# Patient Record
Sex: Male | Born: 1945 | Race: White | Hispanic: No | Marital: Married | Smoking: Former smoker
Health system: Southern US, Community
[De-identification: ages and names within clinical notes are randomized; demographics above are authoritative.]

## PROBLEM LIST (undated history)

## (undated) DIAGNOSIS — Z87442 Personal history of urinary calculi: Secondary | ICD-10-CM

## (undated) DIAGNOSIS — E119 Type 2 diabetes mellitus without complications: Secondary | ICD-10-CM

## (undated) DIAGNOSIS — F32A Depression, unspecified: Secondary | ICD-10-CM

## (undated) DIAGNOSIS — I10 Essential (primary) hypertension: Secondary | ICD-10-CM

## (undated) DIAGNOSIS — E669 Obesity, unspecified: Secondary | ICD-10-CM

## (undated) DIAGNOSIS — G4733 Obstructive sleep apnea (adult) (pediatric): Secondary | ICD-10-CM

## (undated) DIAGNOSIS — G4700 Insomnia, unspecified: Secondary | ICD-10-CM

## (undated) DIAGNOSIS — E785 Hyperlipidemia, unspecified: Secondary | ICD-10-CM

## (undated) DIAGNOSIS — E291 Testicular hypofunction: Secondary | ICD-10-CM

## (undated) DIAGNOSIS — F329 Major depressive disorder, single episode, unspecified: Secondary | ICD-10-CM

## (undated) DIAGNOSIS — M1990 Unspecified osteoarthritis, unspecified site: Secondary | ICD-10-CM

## (undated) DIAGNOSIS — G709 Myoneural disorder, unspecified: Secondary | ICD-10-CM

## (undated) HISTORY — DX: Type 2 diabetes mellitus without complications: E11.9

## (undated) HISTORY — DX: Hyperlipidemia, unspecified: E78.5

## (undated) HISTORY — DX: Obesity, unspecified: E66.9

## (undated) HISTORY — PX: HERNIA REPAIR: SHX51

## (undated) HISTORY — DX: Essential (primary) hypertension: I10

## (undated) HISTORY — DX: Depression, unspecified: F32.A

## (undated) HISTORY — DX: Obstructive sleep apnea (adult) (pediatric): G47.33

## (undated) HISTORY — DX: Insomnia, unspecified: G47.00

## (undated) HISTORY — PX: TONSILLECTOMY: SUR1361

## (undated) HISTORY — DX: Testicular hypofunction: E29.1

## (undated) HISTORY — DX: Major depressive disorder, single episode, unspecified: F32.9

---

## 2004-08-25 ENCOUNTER — Ambulatory Visit: Payer: Self-pay | Admitting: Internal Medicine

## 2004-08-27 ENCOUNTER — Ambulatory Visit: Payer: Self-pay | Admitting: Internal Medicine

## 2004-09-08 ENCOUNTER — Ambulatory Visit: Payer: Self-pay

## 2005-12-03 ENCOUNTER — Emergency Department: Payer: Self-pay | Admitting: Emergency Medicine

## 2005-12-13 ENCOUNTER — Emergency Department: Payer: Self-pay | Admitting: Emergency Medicine

## 2006-03-20 ENCOUNTER — Ambulatory Visit: Payer: Self-pay | Admitting: Family Medicine

## 2006-06-15 ENCOUNTER — Ambulatory Visit: Payer: Self-pay | Admitting: Family Medicine

## 2006-08-30 ENCOUNTER — Emergency Department: Payer: Self-pay | Admitting: Emergency Medicine

## 2006-09-06 ENCOUNTER — Emergency Department: Payer: Self-pay | Admitting: Emergency Medicine

## 2008-01-24 ENCOUNTER — Emergency Department: Payer: Self-pay | Admitting: Unknown Physician Specialty

## 2008-08-04 DIAGNOSIS — E782 Mixed hyperlipidemia: Secondary | ICD-10-CM | POA: Insufficient documentation

## 2008-09-24 ENCOUNTER — Ambulatory Visit: Payer: Self-pay | Admitting: Gastroenterology

## 2008-12-15 ENCOUNTER — Inpatient Hospital Stay: Payer: Self-pay | Admitting: Specialist

## 2009-03-16 DIAGNOSIS — I10 Essential (primary) hypertension: Secondary | ICD-10-CM | POA: Insufficient documentation

## 2012-02-15 ENCOUNTER — Ambulatory Visit: Payer: Self-pay | Admitting: Gastroenterology

## 2012-02-16 LAB — PATHOLOGY REPORT

## 2012-02-28 ENCOUNTER — Ambulatory Visit: Payer: Self-pay | Admitting: Surgery

## 2012-02-28 DIAGNOSIS — I10 Essential (primary) hypertension: Secondary | ICD-10-CM

## 2012-03-05 ENCOUNTER — Ambulatory Visit: Payer: Self-pay | Admitting: Surgery

## 2012-03-06 LAB — PATHOLOGY REPORT

## 2012-12-27 ENCOUNTER — Ambulatory Visit: Payer: Self-pay | Admitting: Otolaryngology

## 2013-10-14 ENCOUNTER — Ambulatory Visit: Payer: Self-pay | Admitting: Nephrology

## 2014-04-22 ENCOUNTER — Ambulatory Visit: Payer: Self-pay | Admitting: Family Medicine

## 2014-05-20 ENCOUNTER — Ambulatory Visit
Admit: 2014-05-20 | Disposition: A | Discharge status: Home / Self Care | Payer: Self-pay | Attending: Family Medicine | Admitting: Family Medicine

## 2014-05-30 ENCOUNTER — Ambulatory Visit
Admit: 2014-05-30 | Disposition: A | Discharge status: Home / Self Care | Payer: Self-pay | Attending: Family Medicine | Admitting: Family Medicine

## 2014-06-20 NOTE — Op Note (Signed)
PATIENT NAME:  Brent Carroll, Brent Carroll DATE OF BIRTH:  [birth year]  DATE OF PROCEDURE:  03/05/2012  PREOPERATIVE DIAGNOSES: Chronic cholecystitis, cholelithiasis.   POSTOPERATIVE DIAGNOSES: Chronic cholecystitis, cholelithiasis.   PROCEDURE: Laparoscopic cholecystectomy, cholangiogram.   SURGEON: Renda RollsWilton Smith, M.D.   ANESTHESIA: General.   INDICATIONS: This 69 year old male has a history of pressure symptoms in the epigastric area. Also had ultrasound findings of multiple gallstones and surgery was recommended for definitive treatment.   DESCRIPTION OF PROCEDURE: The patient was placed on the operating room table in the supine position under general endotracheal anesthesia. The abdomen was clipped and prepared with ChloraPrep, draped in a sterile manner.   A short incision was made in the inferior aspect of the umbilicus and carried down to the deep fascia which was grasped with a laryngeal hook and elevated. A Veress needle was inserted, aspirated, and irrigated with a saline solution. Next, the peritoneal cavity was inflated with carbon dioxide. The Veress needle was removed. The 10 mm cannula was inserted. The 10 mm, 0-degree laparoscope was inserted to view the peritoneal cavity. The liver appeared to have some fatty infiltration. The stomach appeared normal. Another incision was made in the epigastrium slightly to the right of the midline to introduce an 11 mm cannula. Two incisions were made in the lateral aspect of the right upper quadrant to introduce two 5 mm cannulas. The gallbladder was identified and appeared typical. There was a large amount of fatty tissue and a retractor was needed for exposure. Therefore, another incision was made in the mid-epigastrium just slightly to the left of the midline to introduce another 11 mm cannula. The laparoscope was brought to this port. Next, a five finger fan retractor was introduced through the infraumbilical port and was used to retract  the omentum and transverse colon which allowed better exposure. Next, as the gallbladder was retracted towards the right shoulder and the pouch of Jon BillingsMorrison was retracted inferiorly and laterally, the porta hepatis was demonstrated. There was much fatty tissue in the operative area. Next, the cystic duct was dissected free from surrounding structures. Also, the cystic artery was dissected free from surrounding structures. The neck of the gallbladder was mobilized with incision of the visceral peritoneum. A critical view of safety was demonstrated. An Endo Clip was placed across the cystic duct adjacent to the neck of the gallbladder. An incision was made in the cystic duct to introduce a Reddick catheter. Half-strength Conray-60 dye was injected as the cholangiogram was done with fluoroscopy viewing the biliary tree and prompt flow of dye into the duodenum. No retained stones were seen. The Reddick catheter was removed. The cystic duct was doubly ligated with Endoclips and divided. One other small vessel was controlled with Endo Clip and divided. Next, the cystic artery was controlled with double Endoclips and divided. The gallbladder was dissected free from the liver with hook and cautery. Bleeding was very minimal. Hemostasis was subsequently intact. The gallbladder was delivered up through the infraumbilical incision, opened, and suctioned. Next, stone forceps were used removing one stone and then the stones were removed with a stone scoop.  It was noted that with the use of the stone scoop, there were a number of stones which fell out of the gallbladder onto the omentum. The gallbladder was removed. The 10 mm port was reinserted. The Endo Catch bag was used to scoop up these stones. The site was suctioned and appeared clean, and the stones were removed with the bag. All  the stones and gallbladder were submitted in formalin for routine pathology. The right upper quadrant was further inspected. The cannulas were  removed. Carbon dioxide was allowed to escape from the peritoneal cavity. There was some bleeding from the upper most 5 mm port site which was cauterized and the other wounds were inspected. Several small bleeding points were cauterized. All 5 wounds were infiltrated with 0.5% Sensorcaine with epinephrine. All 5 wounds were closed with interrupted 5-0 chromic subcuticular sutures, benzoin, and Steri-Strips. Dressings were applied with paper tape. The patient tolerated surgery satisfactorily and was then prepared for transfer to the recovery room.     ____________________________ Shela Commons. Renda Rolls, MD jws:es D: 03/05/2012 10:41:50 ET T: 03/05/2012 11:10:42 ET JOB#: [id number]  cc: Adella Hare, MD, <Dictator> Adella Hare MD ELECTRONICALLY SIGNED 03/05/2012 19:37

## 2014-08-21 ENCOUNTER — Other Ambulatory Visit: Payer: Self-pay | Admitting: Family Medicine

## 2014-08-22 ENCOUNTER — Encounter: Payer: Self-pay | Admitting: Emergency Medicine

## 2014-08-25 ENCOUNTER — Ambulatory Visit (INDEPENDENT_AMBULATORY_CARE_PROVIDER_SITE_OTHER): Payer: 59 | Admitting: Family Medicine

## 2014-08-25 ENCOUNTER — Encounter: Payer: Self-pay | Admitting: Family Medicine

## 2014-08-25 VITALS — BP 122/76 | HR 91 | Temp 97.8°F | Resp 18 | Ht 71.0 in | Wt 262.0 lb

## 2014-08-25 DIAGNOSIS — K2100 Gastro-esophageal reflux disease with esophagitis, without bleeding: Secondary | ICD-10-CM

## 2014-08-25 DIAGNOSIS — R609 Edema, unspecified: Secondary | ICD-10-CM | POA: Insufficient documentation

## 2014-08-25 DIAGNOSIS — I10 Essential (primary) hypertension: Secondary | ICD-10-CM

## 2014-08-25 DIAGNOSIS — G4733 Obstructive sleep apnea (adult) (pediatric): Secondary | ICD-10-CM | POA: Insufficient documentation

## 2014-08-25 DIAGNOSIS — N182 Chronic kidney disease, stage 2 (mild): Secondary | ICD-10-CM | POA: Insufficient documentation

## 2014-08-25 DIAGNOSIS — E669 Obesity, unspecified: Secondary | ICD-10-CM | POA: Diagnosis not present

## 2014-08-25 DIAGNOSIS — N529 Male erectile dysfunction, unspecified: Secondary | ICD-10-CM | POA: Insufficient documentation

## 2014-08-25 DIAGNOSIS — E0822 Diabetes mellitus due to underlying condition with diabetic chronic kidney disease: Secondary | ICD-10-CM

## 2014-08-25 DIAGNOSIS — IMO0001 Reserved for inherently not codable concepts without codable children: Secondary | ICD-10-CM | POA: Insufficient documentation

## 2014-08-25 DIAGNOSIS — E1129 Type 2 diabetes mellitus with other diabetic kidney complication: Secondary | ICD-10-CM | POA: Insufficient documentation

## 2014-08-25 DIAGNOSIS — E119 Type 2 diabetes mellitus without complications: Secondary | ICD-10-CM | POA: Insufficient documentation

## 2014-08-25 DIAGNOSIS — K219 Gastro-esophageal reflux disease without esophagitis: Secondary | ICD-10-CM | POA: Insufficient documentation

## 2014-08-25 DIAGNOSIS — E291 Testicular hypofunction: Secondary | ICD-10-CM | POA: Insufficient documentation

## 2014-08-25 DIAGNOSIS — M9979 Connective tissue and disc stenosis of intervertebral foramina of abdomen and other regions: Secondary | ICD-10-CM | POA: Insufficient documentation

## 2014-08-25 LAB — POCT UA - MICROALBUMIN: Microalbumin Ur, POC: 20 mg/L

## 2014-08-25 LAB — POCT GLYCOSYLATED HEMOGLOBIN (HGB A1C): Hemoglobin A1C: 8.6

## 2014-08-25 LAB — GLUCOSE, POCT (MANUAL RESULT ENTRY): POC Glucose: 202 mg/dl — AB (ref 70–99)

## 2014-08-25 MED ORDER — GLIMEPIRIDE 2 MG PO TABS: 2.0000 mg | ORAL_TABLET | Freq: Every day | ORAL | Status: DC

## 2014-08-25 NOTE — Patient Instructions (Signed)
F/U 4 MOObesity Obesity is defined as having too much total body fat and a body mass index (BMI) of 30 or more. BMI is an estimate of body fat and is calculated from your height and weight. Obesity happens when you consume more calories than you can burn by exercising or performing daily physical tasks. Prolonged obesity can cause major illnesses or emergencies, such as:   Stroke.  Heart disease.  Diabetes.  Cancer.  Arthritis.  High blood pressure (hypertension).  High cholesterol.  Sleep apnea.  Erectile dysfunction.  Infertility problems. CAUSES   Regularly eating unhealthy foods.  Physical inactivity.  Certain disorders, such as an underactive thyroid (hypothyroidism), Cushing's syndrome, and polycystic ovarian syndrome.  Certain medicines, such as steroids, some depression medicines, and antipsychotics.  Genetics.  Lack of sleep. DIAGNOSIS  A health care provider can diagnose obesity after calculating your BMI. Obesity will be diagnosed if your BMI is 30 or higher.  There are other methods of measuring obesity levels. Some other methods include measuring your skinfold thickness, your waist circumference, and comparing your hip circumference to your waist circumference. TREATMENT  A healthy treatment program includes some or all of the following:  Long-term dietary changes.  Exercise and physical activity.  Behavioral and lifestyle changes.  Medicine only under the supervision of your health care provider. Medicines may help, but only if they are used with diet and exercise programs. An unhealthy treatment program includes:  Fasting.  Fad diets.  Supplements and drugs. These choices do not succeed in long-term weight control.  HOME CARE INSTRUCTIONS   Exercise and perform physical activity as directed by your health care provider. To increase physical activity, try the following:  Use stairs instead of elevators.  Park farther away from store  entrances.  Garden, bike, or walk instead of watching television or using the computer.  Eat healthy, low-calorie foods and drinks on a regular basis. Eat more fruits and vegetables. Use low-calorie cookbooks or take healthy cooking classes.  Limit fast food, sweets, and processed snack foods.  Eat smaller portions.  Keep a daily journal of everything you eat. There are many free websites to help you with this. It may be helpful to measure your foods so you can determine if you are eating the correct portion sizes.  Avoid drinking alcohol. Drink more water and drinks without calories.  Take vitamins and supplements only as recommended by your health care provider.  Weight-loss support groups, Government social research officer, counselors, and stress reduction education can also be very helpful. SEEK IMMEDIATE MEDICAL CARE IF:  You have chest pain or tightness.  You have trouble breathing or feel short of breath.  You have weakness or leg numbness.  You feel confused or have trouble talking.  You have sudden changes in your vision. MAKE SURE YOU:  Understand these instructions.  Will watch your condition.  Will get help right away if you are not doing well or get worse. Document Released: 03/24/2004 Document Revised: 07/01/2013 Document Reviewed: 03/23/2011 [facility] Patient Information 2015 [place], [place]. This information is not intended to replace advice given to you by your health care provider. Make sure you discuss any questions you have with your health care provider.

## 2014-08-25 NOTE — Progress Notes (Signed)
Name: Brent Carroll   MRN: [id number]    DOB: 07/12/1945   Date:08/25/2014       Progress Note  Subjective  Chief Complaint  Chief Complaint  Patient presents with  . Hypertension  . Diabetes  . Hyperlipidemia  . Gastrophageal Reflux    Hypertension This is a chronic problem. The current episode started more than 1 year ago. The problem is unchanged. The problem is controlled. Associated symptoms include shortness of breath. Pertinent negatives include no blurred vision, chest pain, headaches, neck pain, orthopnea or palpitations. There are no associated agents to hypertension. Risk factors for coronary artery disease include diabetes mellitus, dyslipidemia, male gender, obesity, sedentary lifestyle and stress. Past treatments include angiotensin blockers and diuretics. The current treatment provides moderate improvement.  Diabetes He presents for his follow-up diabetic visit. He has type 2 diabetes mellitus. His disease course has been worsening. There are no hypoglycemic associated symptoms. Pertinent negatives for hypoglycemia include no dizziness, headaches, nervousness/anxiousness, seizures or tremors. Associated symptoms include fatigue, polydipsia and weight loss. Pertinent negatives for diabetes include no blurred vision, no chest pain and no weakness. Symptoms are worsening. Diabetic complications include autonomic neuropathy, impotence, nephropathy and peripheral neuropathy. Risk factors for coronary artery disease include diabetes mellitus, dyslipidemia, hypertension, male sex, obesity, sedentary lifestyle and stress. Current diabetic treatment includes oral agent (monotherapy). He is compliant with treatment all of the time. His weight is increasing steadily. He is following a diabetic diet. He rarely participates in exercise. His home blood glucose trend is increasing steadily. His highest blood glucose is >200 mg/dl. An ACE inhibitor/angiotensin II receptor blocker is being taken.   Hyperlipidemia This is a chronic problem. The current episode started more than 1 year ago. The problem is controlled. Recent lipid tests were reviewed and are normal. Exacerbating diseases include diabetes and obesity. Factors aggravating his hyperlipidemia include fatty foods. Associated symptoms include shortness of breath. Pertinent negatives include no chest pain, focal weakness or myalgias. Current antihyperlipidemic treatment includes statins and fibric acid derivatives. The current treatment provides moderate improvement of lipids. There are no compliance problems.   Gastrophageal Reflux He complains of heartburn. He reports no chest pain, no coughing, no nausea or no sore throat. This is a chronic problem. The problem occurs frequently. The problem has been unchanged. The heartburn does not wake him from sleep. The heartburn does not limit his activity. The symptoms are aggravated by bending, caffeine and certain foods. Associated symptoms include fatigue and weight loss. He has tried a PPI for the symptoms. The treatment provided moderate relief.      Past Medical History  Diagnosis Date  . Hypogonadism in male   . Obesity   . Diabetes mellitus without complication   . Hyperlipidemia   . Insomnia   . Hypertension     History  Substance Use Topics  . Smoking status: Former Games developer  . Smokeless tobacco: Not on file  . Alcohol Use: No     Current outpatient prescriptions:  .  aspirin 81 MG tablet, Take 81 mg by mouth daily., Disp: , Rfl:  .  atorvastatin (LIPITOR) 40 MG tablet, Take 40 mg by mouth daily., Disp: , Rfl:  .  fenofibrate micronized (LOFIBRA) 134 MG capsule, Take 1 capsule by mouth  daily, Disp: 90 capsule, Rfl: 3 .  furosemide (LASIX) 20 MG tablet, Take 20 mg by mouth., Disp: , Rfl:  .  JANUVIA 100 MG tablet, Take 1 tablet by mouth  daily, Disp: 90 tablet, Rfl:  3 .  losartan (COZAAR) 100 MG tablet, Take 100 mg by mouth daily., Disp: , Rfl:  .  omeprazole  (PRILOSEC) 20 MG capsule, Take 20 mg by mouth daily., Disp: , Rfl:  .  zolpidem (AMBIEN CR) 12.5 MG CR tablet, Take 12.5 mg by mouth at bedtime as needed for sleep., Disp: , Rfl:  .  gabapentin (NEURONTIN) 300 MG capsule, Take 300 mg by mouth 3 (three) times daily., Disp: , Rfl:   Allergies  Allergen Reactions  . Trazodone And Nefazodone     Review of Systems  Constitutional: Positive for weight loss and fatigue. Negative for fever and chills.  HENT: Negative for congestion, hearing loss, sore throat and tinnitus.   Eyes: Negative for blurred vision, double vision and redness.  Respiratory: Positive for shortness of breath. Negative for cough and hemoptysis.   Cardiovascular: Positive for leg swelling. Negative for chest pain, palpitations, orthopnea and claudication.  Gastrointestinal: Positive for heartburn. Negative for nausea, vomiting, diarrhea, constipation and blood in stool.  Genitourinary: Positive for impotence. Negative for dysuria, urgency, frequency and hematuria.  Musculoskeletal: Positive for joint pain. Negative for myalgias, back pain, falls and neck pain.  Skin: Negative for itching.  Neurological: Positive for sensory change. Negative for dizziness, tingling, tremors, focal weakness, seizures, loss of consciousness, weakness and headaches.  Endo/Heme/Allergies: Positive for polydipsia. Does not bruise/bleed easily.  Psychiatric/Behavioral: Negative for depression and substance abuse. The patient has insomnia. The patient is not nervous/anxious.      Objective  Filed Vitals:   08/25/14 1031  BP: 122/76  Pulse: 91  Temp: 97.8 F (36.6 C)  TempSrc: Oral  Resp: 18  Height: 5\' 11"  (1.803 m)  Weight: 262 lb (118.842 kg)  SpO2: 94%     Physical Exam  Constitutional: He is oriented to person, place, and time and well-developed, well-nourished, and in no distress.  Obese  HENT:  Head: Normocephalic.  Eyes: EOM are normal. Pupils are equal, round, and reactive  to light.  Neck: Normal range of motion. Neck supple. No thyromegaly present.  Cardiovascular: Normal rate, regular rhythm and normal heart sounds.   No murmur heard. Pulmonary/Chest: Effort normal and breath sounds normal. No respiratory distress. He has no wheezes.  Abdominal: Soft. Bowel sounds are normal. There is no tenderness.  Musculoskeletal: Normal range of motion. He exhibits edema.  Lymphadenopathy:    He has no cervical adenopathy.  Neurological: He is alert and oriented to person, place, and time. No cranial nerve deficit. Gait normal. Coordination normal.  Skin: Skin is warm and dry. No rash noted.  Psychiatric: Mood, memory, affect and judgment normal.      Assessment & Plan  1. 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  Dispense: 30 tablet; Refill: 3  2. Benign essential HTN Controlled  3. Gastroesophageal reflux disease with esophagitis Stable  4. Type 2 diabetes mellitus with other diabetic kidney complication Stable  5. Adiposity Handout on obesity

## 2014-08-29 ENCOUNTER — Other Ambulatory Visit: Payer: Self-pay | Admitting: Family Medicine

## 2014-09-11 ENCOUNTER — Encounter: Payer: Self-pay | Admitting: Family Medicine

## 2014-10-13 ENCOUNTER — Other Ambulatory Visit: Payer: Self-pay | Admitting: Family Medicine

## 2014-12-25 ENCOUNTER — Ambulatory Visit: Payer: 59 | Admitting: Family Medicine

## 2015-01-02 ENCOUNTER — Other Ambulatory Visit: Payer: Self-pay | Admitting: Family Medicine

## 2015-01-07 ENCOUNTER — Encounter: Payer: Self-pay | Admitting: Family Medicine

## 2015-01-07 ENCOUNTER — Ambulatory Visit (INDEPENDENT_AMBULATORY_CARE_PROVIDER_SITE_OTHER): Payer: 59 | Admitting: Family Medicine

## 2015-01-07 VITALS — BP 142/80 | HR 93 | Temp 97.6°F | Resp 18 | Ht 71.0 in | Wt 260.5 lb

## 2015-01-07 DIAGNOSIS — Z23 Encounter for immunization: Secondary | ICD-10-CM

## 2015-01-07 DIAGNOSIS — E1129 Type 2 diabetes mellitus with other diabetic kidney complication: Secondary | ICD-10-CM

## 2015-01-07 DIAGNOSIS — N183 Chronic kidney disease, stage 3 unspecified: Secondary | ICD-10-CM

## 2015-01-07 DIAGNOSIS — Z1389 Encounter for screening for other disorder: Secondary | ICD-10-CM | POA: Insufficient documentation

## 2015-01-07 DIAGNOSIS — M9979 Connective tissue and disc stenosis of intervertebral foramina of abdomen and other regions: Secondary | ICD-10-CM

## 2015-01-07 DIAGNOSIS — E1169 Type 2 diabetes mellitus with other specified complication: Secondary | ICD-10-CM

## 2015-01-07 DIAGNOSIS — G4700 Insomnia, unspecified: Secondary | ICD-10-CM

## 2015-01-07 DIAGNOSIS — E785 Hyperlipidemia, unspecified: Secondary | ICD-10-CM

## 2015-01-07 DIAGNOSIS — I10 Essential (primary) hypertension: Secondary | ICD-10-CM

## 2015-01-07 DIAGNOSIS — E1121 Type 2 diabetes mellitus with diabetic nephropathy: Secondary | ICD-10-CM | POA: Insufficient documentation

## 2015-01-07 DIAGNOSIS — K5900 Constipation, unspecified: Secondary | ICD-10-CM | POA: Insufficient documentation

## 2015-01-07 LAB — GLUCOSE, POCT (MANUAL RESULT ENTRY): POC Glucose: 279 mg/dl — AB (ref 70–99)

## 2015-01-07 LAB — POCT GLYCOSYLATED HEMOGLOBIN (HGB A1C): HEMOGLOBIN A1C: 8.8

## 2015-01-07 MED ORDER — GLIMEPIRIDE 4 MG PO TABS: 4.0000 mg | ORAL_TABLET | Freq: Every day | ORAL | Status: DC

## 2015-01-07 MED ORDER — ZOLPIDEM TARTRATE ER 12.5 MG PO TBCR: 12.5000 mg | EXTENDED_RELEASE_TABLET | Freq: Every evening | ORAL | Status: DC | PRN

## 2015-01-07 NOTE — Progress Notes (Signed)
Name: Brent Carroll   MRN: [id number]    DOB: [birth year]   Date:01/07/2015       Progress Note  Subjective  Chief Complaint  Chief Complaint  Patient presents with  . Hypertension    4 month follow up  . Diabetes  . Hyperlipidemia  . Insomnia    HPI  Diabetes  Patient presents for follow-up of diabetes which is present for over 5 years. Is currently on a regimen of glimepiride 2 mg daily and Januvia 100 mg daily. Patient states he is not always compliant with their diet and exercise. There's been no hypoglycemic episodes and there is no polyuria polydipsia polyphagia. His average fasting glucoses been in the low around mid to upper 100s with a high around low to mid 200s . There is no end organ disease.  Last diabetic eye exam was earlier this year.   Last visit with dietitian was more in the year ago. Last microalbumin was obtained June 2016 was normal at 20 .   Hyperlipidemia  Patient has a history of hyperlipidemia for over 5 years.  Current medical regimen consist of atorvastatin 40 mg daily at bedtime.  Compliance is good.  Diet and exercise are currently followed rarely .  Risk factors for cardiovascular disease include hyperlipidemia hypertension diabetes obesity sedentary lifestyle .   There have been no side effects from the medication.    Hypertension   Patient presents for follow-up of hypertension. It has been present for over 5 years.  Patient states that there is compliance with medical regimen which consists of losartan 100 mg daily . There is no end organ disease. Cardiac risk factors include hypertension hyperlipidemia and diabetes.  Exercise regimen consist of minimal walking .  Diet consist of unhealthy.  Insomnia  Patient continues her problem difficulty falling asleep. Also has early awakening problems.  Obesity   patient has long-standing history of obesity. He currently is not exercising with any regularity. He has been to the dietitian and is supposedly  is on ADA diet. His weight since last visit has  decreased by 2 pounds  Past Medical History  Diagnosis Date  . Hypogonadism in male   . Obesity   . Diabetes mellitus without complication ([place])   . Hyperlipidemia   . Insomnia   . Hypertension     Social History  Substance Use Topics  . Smoking status: Former Research scientist (life sciences)  . Smokeless tobacco: Not on file  . Alcohol Use: No     Current outpatient prescriptions:  .  aspirin 81 MG tablet, Take 81 mg by mouth daily., Disp: , Rfl:  .  atorvastatin (LIPITOR) 40 MG tablet, Take 1 tablet by mouth  daily, Disp: 90 tablet, Rfl: 0 .  fenofibrate micronized (LOFIBRA) 134 MG capsule, Take 1 capsule by mouth  daily, Disp: 90 capsule, Rfl: 3 .  furosemide (LASIX) 20 MG tablet, Take 20 mg by mouth., Disp: , Rfl:  .  gabapentin (NEURONTIN) 300 MG capsule, Take 300 mg by mouth 3 (three) times daily., Disp: , Rfl:  .  glimepiride (AMARYL) 2 MG tablet, Take 1 tablet by mouth  daily before breakfast, Disp: 90 tablet, Rfl: 3 .  JANUVIA 100 MG tablet, Take 1 tablet by mouth  daily, Disp: 90 tablet, Rfl: 3 .  losartan (COZAAR) 100 MG tablet, Take 1 tablet by mouth  daily, Disp: 90 tablet, Rfl: 0 .  omeprazole (PRILOSEC) 20 MG capsule, Take 20 mg by mouth daily., Disp: , Rfl:  .  zolpidem (AMBIEN CR) 12.5 MG CR tablet, Take 12.5 mg by mouth at bedtime as needed for sleep., Disp: , Rfl:   Allergies  Allergen Reactions  . Trazodone And Nefazodone     Review of Systems  Constitutional: Positive for malaise/fatigue. Negative for fever, chills and weight loss.  HENT: Negative for congestion, hearing loss, sore throat and tinnitus.   Eyes: Negative for blurred vision, double vision and redness.  Respiratory: Negative for cough, hemoptysis and shortness of breath.   Cardiovascular: Positive for leg swelling. Negative for chest pain, palpitations, orthopnea and claudication.  Gastrointestinal: Negative for heartburn, nausea, vomiting, diarrhea, constipation and  blood in stool.  Genitourinary: Negative for dysuria, urgency, frequency and hematuria.  Musculoskeletal: Positive for back pain and joint pain. Negative for myalgias, falls and neck pain.  Skin: Negative for itching.  Neurological: Negative for dizziness, tingling, tremors, focal weakness, seizures, loss of consciousness, weakness and headaches.  Endo/Heme/Allergies: Does not bruise/bleed easily.  Psychiatric/Behavioral: Negative for depression and substance abuse. The patient has insomnia. The patient is not nervous/anxious.      Objective  Filed Vitals:   01/07/15 0923  BP: 142/80  Pulse: 93  Temp: 97.6 F (36.4 C)  TempSrc: Oral  Resp: 18  Height: 5' 11"  (1.803 m)  Weight: 260 lb 8 oz (118.162 kg)  SpO2: 96%     Physical Exam  Constitutional: He is oriented to person, place, and time and well-developed, well-nourished, and in no distress.  HENT:  Head: Normocephalic.  Eyes: EOM are normal. Pupils are equal, round, and reactive to light.  Neck: Normal range of motion. Neck supple. No thyromegaly present.  Cardiovascular: Normal rate, regular rhythm and normal heart sounds.   No murmur heard. Pulmonary/Chest: Effort normal and breath sounds normal. No respiratory distress. He has no wheezes.  Abdominal: Soft. Bowel sounds are normal.  Musculoskeletal: Normal range of motion. He exhibits no edema.  Lymphadenopathy:    He has no cervical adenopathy.  Neurological: He is alert and oriented to person, place, and time. No cranial nerve deficit. Gait normal. Coordination normal.  Skin: Skin is warm and dry. No rash noted.  Psychiatric: Affect and judgment normal.      Assessment & Plan  1. Type 2 diabetes mellitus with other specified complication (HCC) Uncontrolled. We'll increase glimepiride to 4 mg daily and attempt him to go on for dietary consult - POCT HgB A1C - POCT Glucose (CBG) - Comprehensive Metabolic Panel (CMET) - TSH - Ambulatory referral to diabetic  education - glimepiride (AMARYL) 4 MG tablet; Take 1 tablet (4 mg total) by mouth daily with breakfast.  Dispense: 90 tablet; Refill: 1  2. Type 2 diabetes mellitus with other diabetic kidney complication (HCC) Last EGFR was 56 and February of this year  3. Benign essential HTN Not a goal of 130/80. Encouraged diet and exercise compliance and recheck a return visit. Episodic: Will increase antihypertensive regimen - Comprehensive Metabolic Panel (CMET) - Lipid panel - TSH - Ambulatory referral to diabetic education  4. Insomnia, persistent Continue Ambien  5. Chronic kidney disease (CKD), stage III (moderate) Continue to monitor and to work for better diabetes control  6. Narrowing of intervertebral disc space Symptomatically treatment  7. Hyperlipidemia Labs given today - Lipid panel  8. Need for influenza vaccination Given today - Flu vaccine HIGH DOSE PF (Fluzone High dose)

## 2015-01-08 LAB — COMPREHENSIVE METABOLIC PANEL
A/G RATIO: 2 (ref 1.1–2.5)
ALBUMIN: 4.6 g/dL (ref 3.6–4.8)
ALK PHOS: 94 IU/L (ref 39–117)
ALT: 31 IU/L (ref 0–44)
AST: 21 IU/L (ref 0–40)
BILIRUBIN TOTAL: 0.7 mg/dL (ref 0.0–1.2)
BUN / CREAT RATIO: 13 (ref 10–22)
BUN: 16 mg/dL (ref 8–27)
CHLORIDE: 103 mmol/L (ref 97–106)
CO2: 24 mmol/L (ref 18–29)
Calcium: 9.4 mg/dL (ref 8.6–10.2)
Creatinine, Ser: 1.28 mg/dL — ABNORMAL HIGH (ref 0.76–1.27)
GFR calc Af Amer: 67 mL/min/{1.73_m2} (ref >59)
GFR calc non Af Amer: 58 mL/min/{1.73_m2} — ABNORMAL LOW (ref >59)
GLOBULIN, TOTAL: 2.3 g/dL (ref 1.5–4.5)
GLUCOSE: 294 mg/dL — AB (ref 65–99)
POTASSIUM: 5.3 mmol/L — AB (ref 3.5–5.2)
SODIUM: 143 mmol/L (ref 136–144)
Total Protein: 6.9 g/dL (ref 6.0–8.5)

## 2015-01-08 LAB — LIPID PANEL
CHOLESTEROL TOTAL: 120 mg/dL (ref 100–199)
Chol/HDL Ratio: 4.8 ratio units (ref 0.0–5.0)
HDL: 25 mg/dL — ABNORMAL LOW (ref >39)
LDL Calculated: 46 mg/dL (ref 0–99)
Triglycerides: 243 mg/dL — ABNORMAL HIGH (ref 0–149)
VLDL Cholesterol Cal: 49 mg/dL — ABNORMAL HIGH (ref 5–40)

## 2015-01-08 LAB — TSH: TSH: 1.68 u[IU]/mL (ref 0.450–4.500)

## 2015-01-20 ENCOUNTER — Telehealth: Payer: Self-pay

## 2015-01-20 ENCOUNTER — Encounter: Payer: 59 | Attending: Family Medicine | Admitting: Dietician

## 2015-01-20 VITALS — Ht 69.0 in | Wt 259.2 lb

## 2015-01-20 DIAGNOSIS — E119 Type 2 diabetes mellitus without complications: Secondary | ICD-10-CM | POA: Diagnosis present

## 2015-01-20 DIAGNOSIS — E669 Obesity, unspecified: Secondary | ICD-10-CM | POA: Diagnosis present

## 2015-01-20 NOTE — Telephone Encounter (Signed)
Patient vm was not set up, unable to leave message. I was calling regarding patients labs.

## 2015-01-20 NOTE — Patient Instructions (Signed)
   Decrease food portions, especially starch portions.   Choose mostly chicken and [place] for meats, or fish.   Eat generous portions of vegetables and eat a vegetable as often as possible. Goal is 3-4 servings daily.  Include fruit 2 or more times a day, with a meal or for a snack.   Gradually increase exercise safely, start with a few minutes and add more time as you can, or add another few minutes later in the day.  Exercise is what will increase metabolism.

## 2015-01-20 NOTE — Progress Notes (Signed)
Medical Nutrition Therapy: Visit start time: 1130  end time: 1300  Assessment:  Diagnosis: Type 2 Diabetes, obesity Past medical history: GERD per patient, also knee pain Psychosocial issues/ stress concerns: patient reports stress related to moving residences. Preferred learning method:  . No preference indicated  Current weight: 259.2lbs  Height: 5'9.5" Medications, supplements: reviewed list in chart with patient  Progress and evaluation: Patient does check blood sugars sometimes, states A1C has recently increased to >7% when it had been 6.5% for several years.          He has not had any major diet or lifestyle changes recently.          He feels that he would not be able to follow a structured meal plan, but does think he needs to work on reducing food portions, and on increasing his metabolism.         He will be moving to new home in a few weeks.   Physical activity: no structured exercise, due to balance issues and chronic knee pain  Dietary Intake:  Usual eating pattern includes 2-3 meals and 1 snacks per day. Dining out frequency: 3-4 meals per week.  Breakfast: egg sandwich, coffee; none on Sundays Snack: none Lunch: sandwich or banana, or sometimes out i.e. Hot dog. Balanced meal on Sundays Snack: none Supper: 11/21 pizza; often casserole, hot dogs, chinese, bojangles chicken; out on Sundays  Does eat salad, but not on a daily basis. Does eat cooked vegetables at home also. Snack: very little sweets. Sometimes glass of milk or water, occasionally pepsi. About 2 times a week ritz crackers, cheese, beef stick and olive --2-3 crackers.  Beverages: water, coffee in am, sometimes milk or pepsi (regular, not diet)  Nutrition Care Education: Topics covered: Diabetes, weight management Basic nutrition: basic food groups, appropriate nutrient balance, appropriate meal and snack schedule, general nutrition guidelines    Weight control: benefits of weight control, behavioral changes  for weight loss for portion control Advanced nutrition:  recipe modification, cooking techniques, dining out, food label reading Diabetes:  goals for BGs, appropriate meal and snack schedule, appropriate carb intake and balance, importance of limiting starch portions and increasing vegetables and fruits.        BG testing and goals for BGs, high and low BGs and when to be concerned/ call MD (per pt request) Other lifestyle changes:  benefits of making changes, increasing exercise safely to boost metabolism.  Nutritional Diagnosis:  [place]-3.3 Overweight/obesity As related to inactivity, excess calorie intake.  As evidenced by patient report, high BMI.  Intervention: Instruction as noted above.   Set goals with patient input.   Patient declined scheduling follow-up at this time due to moving; he will schedule later if needed.   Encouraged patient to call with any questions or concerns.  Education Materials given:  . 1-pg meal planner . Sample meal pattern/ menus  Living With Diabetes . Goals/ instructions  Learner/ who was taught:  . Patient   Level of understanding: Marland Kitchen. Verbalizes/ demonstrates competency  Demonstrated degree of understanding via:   Teach back Learning barriers: . None  Willingness to learn/ readiness for change: . Acceptance, ready for change   Monitoring and Evaluation:  Dietary intake, exercise, BG control, and body weight      follow up: prn

## 2015-01-20 NOTE — Telephone Encounter (Signed)
-----   Message from Dennison MascotLemont Morrisey, MD sent at 01/08/2015  3:41 PM EST ----- Labs are stable

## 2015-04-02 ENCOUNTER — Other Ambulatory Visit: Payer: Self-pay | Admitting: Family Medicine

## 2015-04-16 ENCOUNTER — Other Ambulatory Visit: Payer: Self-pay | Admitting: Family Medicine

## 2015-05-07 ENCOUNTER — Ambulatory Visit: Payer: 59 | Admitting: Family Medicine

## 2015-05-15 ENCOUNTER — Other Ambulatory Visit: Payer: Self-pay

## 2015-05-15 MED ORDER — ZOLPIDEM TARTRATE ER 12.5 MG PO TBCR: 12.5000 mg | EXTENDED_RELEASE_TABLET | Freq: Every evening | ORAL | Status: DC | PRN

## 2015-05-16 ENCOUNTER — Other Ambulatory Visit: Payer: Self-pay | Admitting: Family Medicine

## 2015-06-13 ENCOUNTER — Other Ambulatory Visit: Payer: Self-pay | Admitting: Family Medicine

## 2015-06-30 ENCOUNTER — Ambulatory Visit: Payer: 59 | Admitting: Family Medicine

## 2015-07-28 ENCOUNTER — Other Ambulatory Visit: Payer: Self-pay | Admitting: Family Medicine

## 2015-08-11 ENCOUNTER — Other Ambulatory Visit: Payer: Self-pay | Admitting: Family Medicine

## 2015-08-13 ENCOUNTER — Encounter: Payer: Self-pay | Admitting: Family Medicine

## 2015-08-13 ENCOUNTER — Ambulatory Visit (INDEPENDENT_AMBULATORY_CARE_PROVIDER_SITE_OTHER): Payer: 59 | Admitting: Family Medicine

## 2015-08-13 ENCOUNTER — Other Ambulatory Visit: Payer: Self-pay | Admitting: Family Medicine

## 2015-08-13 VITALS — BP 122/80 | HR 75 | Temp 98.0°F | Resp 18 | Ht 69.0 in | Wt 251.2 lb

## 2015-08-13 DIAGNOSIS — E785 Hyperlipidemia, unspecified: Secondary | ICD-10-CM

## 2015-08-13 DIAGNOSIS — E1129 Type 2 diabetes mellitus with other diabetic kidney complication: Secondary | ICD-10-CM | POA: Diagnosis not present

## 2015-08-13 DIAGNOSIS — I10 Essential (primary) hypertension: Secondary | ICD-10-CM | POA: Diagnosis not present

## 2015-08-13 DIAGNOSIS — E559 Vitamin D deficiency, unspecified: Secondary | ICD-10-CM | POA: Diagnosis not present

## 2015-08-13 DIAGNOSIS — N183 Chronic kidney disease, stage 3 unspecified: Secondary | ICD-10-CM

## 2015-08-13 DIAGNOSIS — G4700 Insomnia, unspecified: Secondary | ICD-10-CM | POA: Diagnosis not present

## 2015-08-13 DIAGNOSIS — E669 Obesity, unspecified: Secondary | ICD-10-CM | POA: Diagnosis not present

## 2015-08-13 LAB — GLUCOSE, POCT (MANUAL RESULT ENTRY): POC Glucose: 254 mg/dl — AB (ref 70–99)

## 2015-08-13 LAB — POCT GLYCOSYLATED HEMOGLOBIN (HGB A1C): HEMOGLOBIN A1C: 9.5

## 2015-08-13 MED ORDER — METFORMIN HCL 500 MG PO TABS: 500.0000 mg | ORAL_TABLET | Freq: Two times a day (BID) | ORAL | Status: DC

## 2015-08-13 NOTE — Progress Notes (Signed)
Date:  08/13/2015   Name:  Brent Carroll   DOB:  [birth year]   MRN:  [id number]  PCP:  Ashok Norris, MD    Chief Complaint: Diabetes   History of Present Illness:  This is a 70 y.o. male seen in seven month f/u. T2DM, stopped taking Januvia and Amaryl when lost in move, tolerated metformin in past. Takes Ambien for insomnia long term, requests refill. HTN on losartan, HLD on Lipitor and [place]. 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 Mild LLE edema, c/o feels cold all the time.  Review of Systems:  Review of Systems  Constitutional: Negative for fever and fatigue.  Respiratory: Negative for cough and shortness of breath.   Cardiovascular: Negative for chest pain.  Endocrine: Negative for polyuria.  Genitourinary: Negative for difficulty urinating.  Neurological: Negative for syncope and light-headedness.    Patient Active Problem List   Diagnosis Date Noted  . 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 CN (constipation) 01/07/2015  . Screening for gout 01/07/2015  . Hyperlipidemia 01/07/2015  . Chronic kidney disease (CKD), stage III (moderate) 08/25/2014  . Narrowing of intervertebral disc space 08/25/2014  . Diabetes ([place]) 08/25/2014  . Type 2 diabetes mellitus with other diabetic kidney complication ([place]) 71/69/6789  . Failure of erection 08/25/2014  . Can't get food down 08/25/2014  . Accumulation of fluid in tissues 08/25/2014  . Acid reflux 08/25/2014  . 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 Obesity, Class II, BMI 35-39.9 08/25/2014  . Obstructive apnea 08/25/2014  . Benign essential HTN 03/16/2009  . Insomnia, persistent 02/10/2009  . Lumbosacral neuritis 02/10/2009  . Combined fat and carbohydrate induced hyperlipemia 08/04/2008    Prior to Admission medications   Medication Sig Start Date End Date Taking? Authorizing Provider  atorvastatin (LIPITOR) 40 MG tablet Take 1 tablet by mouth   daily 08/05/15  Yes Ashok Norris, MD  fenofibrate micronized (LOFIBRA) 134 MG capsule Take 1 capsule by mouth  daily 08/21/14  Yes Ashok Norris, MD  losartan (COZAAR) 100 MG tablet Take 1 tablet by mouth  daily 08/05/15  Yes Ashok Norris, MD  zolpidem (AMBIEN CR) 12.5 MG CR tablet TAKE 1 TABLET BY MOUTH EVERY NIGHT AT BEDTIME AS NEEDED FOR SLEEP 05/18/15  Yes Ashok Norris, MD  glimepiride (AMARYL) 4 MG tablet Take 1 tablet (4 mg total) by mouth daily with breakfast. Patient not taking: Reported on 08/13/2015 01/07/15   Ashok Norris, MD  metFORMIN (GLUCOPHAGE) 500 MG tablet Take 1 tablet (500 mg total) by mouth 2 (two) times daily with a meal. 08/13/15   Adline Potter, MD    Allergies  Allergen Reactions  . Trazodone And Nefazodone     Past Surgical History  Procedure Laterality Date  . Hernia repair    . Tonsillectomy      Social History  Substance Use Topics  . Smoking status: Former Research scientist (life sciences)  . Smokeless tobacco: None  . Alcohol Use: No    Family History  Problem Relation Age of Onset  . Heart disease Father     Medication list has been reviewed and updated.  Physical Examination: BP 122/80 mmHg  Pulse 75  Temp(Src) 98 F (36.7 C)  Resp 18  Ht 5' 9"  (1.753 m)  Wt 251 lb 4 oz (113.966 kg)  BMI 37.09 kg/m2  SpO2 98%  Physical Exam  Constitutional: He appears well-developed and well-nourished.  Cardiovascular: Normal rate,  regular rhythm and normal heart sounds.   Pulmonary/Chest: Effort normal and breath sounds normal.  Musculoskeletal:  Trace LLE edema  Neurological: He is alert.  Skin: Skin is warm and dry.  Psychiatric: He has a normal mood and affect. His behavior is normal.  Nursing note and vitals reviewed.   Assessment and Plan:  1. Benign essential HTN Well controlled on losartan - Comprehensive Metabolic Panel (CMET) - CBC  2. Type 2 diabetes mellitus with other diabetic kidney complication (HCC) C2I 8.1% today, up from 8.8% in November,  change Januvia to metformin 500 mg bid, refill Amaryl - POCT HgB A1C - POCT Glucose (CBG)  3. Insomnia, persistent Unable to refill Ambien as locums  4. Chronic kidney disease (CKD), stage III (moderate) eGFR 58 in November, recheck  5. Hyperlipidemia Well controlled on Lipitor/Lofibra  6. Obesity, Class II, BMI 35-39.9 Weight down 9# from last visit, further weight loss discussed - Vitamin D (25 hydroxy) - B12  Return in about 3 months (around 11/13/2015).  Satira Anis. [facility]  08/13/2015

## 2015-08-14 ENCOUNTER — Other Ambulatory Visit: Payer: Self-pay

## 2015-08-14 DIAGNOSIS — E559 Vitamin D deficiency, unspecified: Secondary | ICD-10-CM | POA: Insufficient documentation

## 2015-08-14 LAB — COMPREHENSIVE METABOLIC PANEL
ALBUMIN: 4.5 g/dL (ref 3.6–4.8)
ALT: 29 IU/L (ref 0–44)
AST: 22 IU/L (ref 0–40)
Albumin/Globulin Ratio: 1.8 (ref 1.2–2.2)
Alkaline Phosphatase: 80 IU/L (ref 39–117)
BUN / CREAT RATIO: 18 (ref 10–24)
BUN: 19 mg/dL (ref 8–27)
Bilirubin Total: 0.8 mg/dL (ref 0.0–1.2)
CO2: 23 mmol/L (ref 18–29)
CREATININE: 1.04 mg/dL (ref 0.76–1.27)
Calcium: 9.2 mg/dL (ref 8.6–10.2)
Chloride: 100 mmol/L (ref 96–106)
GFR calc Af Amer: 85 mL/min/{1.73_m2} (ref >59)
GFR calc non Af Amer: 74 mL/min/{1.73_m2} (ref >59)
GLUCOSE: 238 mg/dL — AB (ref 65–99)
Globulin, Total: 2.5 g/dL (ref 1.5–4.5)
Potassium: 4.7 mmol/L (ref 3.5–5.2)
Sodium: 141 mmol/L (ref 134–144)
TOTAL PROTEIN: 7 g/dL (ref 6.0–8.5)

## 2015-08-14 LAB — VITAMIN D 25 HYDROXY (VIT D DEFICIENCY, FRACTURES): Vit D, 25-Hydroxy: 9.8 ng/mL — ABNORMAL LOW (ref 30.0–100.0)

## 2015-08-14 LAB — VITAMIN B12: Vitamin B-12: 317 pg/mL (ref 211–946)

## 2015-08-14 LAB — CBC
HEMATOCRIT: 40 % (ref 37.5–51.0)
Hemoglobin: 13.3 g/dL (ref 12.6–17.7)
MCH: 29.8 pg (ref 26.6–33.0)
MCHC: 33.3 g/dL (ref 31.5–35.7)
MCV: 90 fL (ref 79–97)
Platelets: 214 10*3/uL (ref 150–379)
RBC: 4.46 x10E6/uL (ref 4.14–5.80)
RDW: 14 % (ref 12.3–15.4)
WBC: 6.2 10*3/uL (ref 3.4–10.8)

## 2015-08-14 MED ORDER — ZOLPIDEM TARTRATE ER 12.5 MG PO TBCR: 12.5000 mg | EXTENDED_RELEASE_TABLET | Freq: Every evening | ORAL | Status: DC | PRN

## 2015-08-14 MED ORDER — VITAMIN D 50 MCG (2000 UT) PO CAPS: 1.0000 | ORAL_CAPSULE | Freq: Every day | ORAL | Status: DC

## 2015-08-14 NOTE — Addendum Note (Signed)
Addended by: Schuyler AmorPLONK, Dhriti Fales on: 08/14/2015 09:37 AM   Modules accepted: Orders

## 2015-09-10 ENCOUNTER — Other Ambulatory Visit: Payer: Self-pay | Admitting: Family Medicine

## 2015-09-10 ENCOUNTER — Telehealth: Payer: Self-pay | Admitting: Family Medicine

## 2015-09-10 MED ORDER — ZOLPIDEM TARTRATE ER 12.5 MG PO TBCR: 12.5000 mg | EXTENDED_RELEASE_TABLET | Freq: Every evening | ORAL | Status: DC | PRN

## 2015-09-10 NOTE — Progress Notes (Signed)
Please let him know we will try decreasing dose to 6.25 mg on his next visit

## 2015-09-10 NOTE — Telephone Encounter (Signed)
Was last seen by Dr Hollace HaywardPlonk on 08/13/15 and has upcoming appt with you for 11/12/15. He is asking for a refill for Zolpidem and is asking for enough to last until his appointment with you. Asking that you please send to [facility] [address]

## 2015-09-10 NOTE — Telephone Encounter (Signed)
Refill request was sent to Dr. Krichna Sowles for approval and submission.  

## 2015-09-21 ENCOUNTER — Other Ambulatory Visit: Payer: Self-pay | Admitting: Family Medicine

## 2015-09-21 ENCOUNTER — Telehealth: Payer: Self-pay | Admitting: Family Medicine

## 2015-09-21 DIAGNOSIS — E1169 Type 2 diabetes mellitus with other specified complication: Secondary | ICD-10-CM

## 2015-09-21 MED ORDER — GLIMEPIRIDE 4 MG PO TABS: 4.0000 mg | ORAL_TABLET | Freq: Every day | ORAL | 0 refills | Status: DC

## 2015-09-21 MED ORDER — FENOFIBRATE MICRONIZED 134 MG PO CAPS: 134.0000 mg | ORAL_CAPSULE | Freq: Every day | ORAL | 0 refills | Status: DC

## 2015-09-21 NOTE — Telephone Encounter (Signed)
done

## 2015-09-22 NOTE — Telephone Encounter (Signed)
Patient notified of medication sent to Mail Order.

## 2015-09-29 MED ORDER — GLIMEPIRIDE 4 MG PO TABS: 4.0000 mg | ORAL_TABLET | Freq: Every day | ORAL | 1 refills | Status: DC

## 2015-09-29 MED ORDER — FENOFIBRATE MICRONIZED 134 MG PO CAPS: 134.0000 mg | ORAL_CAPSULE | Freq: Every day | ORAL | 3 refills | Status: DC

## 2015-09-29 NOTE — Telephone Encounter (Signed)
Please close this chart for me. Thank you

## 2015-09-30 ENCOUNTER — Emergency Department
Admission: EM | Admit: 2015-09-30 | Discharge: 2015-09-30 | Disposition: A | Discharge status: Home / Self Care | Payer: 59 | Attending: Emergency Medicine | Admitting: Emergency Medicine

## 2015-09-30 ENCOUNTER — Emergency Department: Payer: 59

## 2015-09-30 DIAGNOSIS — M25562 Pain in left knee: Secondary | ICD-10-CM | POA: Diagnosis present

## 2015-09-30 DIAGNOSIS — Y999 Unspecified external cause status: Secondary | ICD-10-CM | POA: Insufficient documentation

## 2015-09-30 DIAGNOSIS — E119 Type 2 diabetes mellitus without complications: Secondary | ICD-10-CM | POA: Diagnosis not present

## 2015-09-30 DIAGNOSIS — W19XXXA Unspecified fall, initial encounter: Secondary | ICD-10-CM | POA: Insufficient documentation

## 2015-09-30 DIAGNOSIS — Z87891 Personal history of nicotine dependence: Secondary | ICD-10-CM | POA: Insufficient documentation

## 2015-09-30 DIAGNOSIS — Y929 Unspecified place or not applicable: Secondary | ICD-10-CM | POA: Insufficient documentation

## 2015-09-30 DIAGNOSIS — I10 Essential (primary) hypertension: Secondary | ICD-10-CM | POA: Diagnosis not present

## 2015-09-30 DIAGNOSIS — S8002XA Contusion of left knee, initial encounter: Secondary | ICD-10-CM | POA: Insufficient documentation

## 2015-09-30 DIAGNOSIS — M25462 Effusion, left knee: Secondary | ICD-10-CM | POA: Insufficient documentation

## 2015-09-30 DIAGNOSIS — Y939 Activity, unspecified: Secondary | ICD-10-CM | POA: Diagnosis not present

## 2015-09-30 MED ORDER — HYDROCODONE-ACETAMINOPHEN 5-325 MG PO TABS: 1.0000 | ORAL_TABLET | ORAL | 0 refills | Status: DC | PRN

## 2015-09-30 MED ORDER — HYDROCODONE-ACETAMINOPHEN 5-325 MG PO TABS
2.0000 | ORAL_TABLET | Freq: Once | ORAL | Status: AC
  Administered 2015-09-30: 2 via ORAL
  Filled 2015-09-30: qty 2

## 2015-09-30 NOTE — ED Triage Notes (Addendum)
Pt to triage via w/c with no distress noted; reports falling today and injuring left knee; st able to weight bear but pain with ROM; 800mg  ibuprofen taken at 3pm

## 2015-09-30 NOTE — ED Notes (Signed)
Pt in via triage with complaints of left knee pain and swelling, pt reports mechanical fall this evening causing the injury.  Pt unable to bear weight on LLE w/ limited ROM due to pain.  Pt A/Ox4, no immediate distress at this time.

## 2015-09-30 NOTE — ED Provider Notes (Signed)
[facility] Emergency Department Provider Note  ____________________________________________  Time seen: Approximately 9:51 PM  I have reviewed the triage vital signs and the nursing notes.   HISTORY  Chief Complaint Knee Pain    HPI Brent Carroll is a 70 y.o. male presents for evaluation of left knee pain. Patient reports falling earlier today able to bear weight but has increased pain with movement.   Past Medical History:  Diagnosis Date  . Diabetes mellitus without complication (HCC)   . Hyperlipidemia   . Hypertension   . Hypogonadism in male   . Insomnia   . Obesity     Patient Active Problem List   Diagnosis Date Noted  . Vitamin D deficiency 08/14/2015  . CN (constipation) 01/07/2015  . Hyperlipidemia 01/07/2015  . Chronic kidney disease (CKD), stage III (moderate) 08/25/2014  . Narrowing of intervertebral disc space 08/25/2014  . Type 2 diabetes mellitus with other diabetic kidney complication (HCC) 08/25/2014  . Failure of erection 08/25/2014  . Acid reflux 08/25/2014  . 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 Obesity, Class II, BMI 35-39.9 08/25/2014  . Obstructive apnea 08/25/2014  . Benign essential HTN 03/16/2009  . Insomnia, persistent 02/10/2009    Past Surgical History:  Procedure Laterality Date  . HERNIA REPAIR    . TONSILLECTOMY      Prior to Admission medications   Medication Sig Start Date End Date Taking? Authorizing Provider  atorvastatin (LIPITOR) 40 MG tablet Take 1 tablet by mouth  daily 08/05/15   Dennison Mascot, MD  Cholecalciferol (VITAMIN D) 2000 units CAPS Take 1 capsule (2,000 Units total) by mouth daily. 08/14/15   Schuyler Amor, MD  fenofibrate micronized (LOFIBRA) 134 MG capsule Take 1 capsule (134 mg total) by mouth daily. 09/29/15   Alba Cory, MD  glimepiride (AMARYL) 4 MG tablet Take 1 tablet (4 mg total) by mouth daily with breakfast. 09/29/15   Alba Cory, MD  HYDROcodone-acetaminophen (NORCO)  5-325 MG tablet Take 1-2 tablets by mouth every 4 (four) hours as needed for moderate pain. 09/30/15   Charmayne Sheer Janan Bogie, PA-C  losartan (COZAAR) 100 MG tablet Take 1 tablet by mouth  daily 08/05/15   Dennison Mascot, MD  metFORMIN (GLUCOPHAGE) 500 MG tablet Take 1 tablet (500 mg total) by mouth 2 (two) times daily with a meal. 08/13/15   Schuyler Amor, MD  zolpidem (AMBIEN CR) 12.5 MG CR tablet Take 1 tablet (12.5 mg total) by mouth at bedtime as needed. for sleep 09/10/15   Alba Cory, MD    Allergies Trazodone and nefazodone  Family History  Problem Relation Age of Onset  . Heart disease Father     Social History Social History  Substance Use Topics  . Smoking status: Former Games developer  . Smokeless tobacco: Not on file  . Alcohol use No    Review of Systems Constitutional: No fever/chills Musculoskeletal: Positive for left knee pain. Skin: Negative for rash. Neurological: Negative for headaches, focal weakness or numbness.  10-point ROS otherwise negative.  ____________________________________________   PHYSICAL EXAM:  VITAL SIGNS: ED Triage Vitals  Enc Vitals Group     BP 09/30/15 2112 113/70     Pulse Rate 09/30/15 2112 74     Resp 09/30/15 2112 18     Temp 09/30/15 2112 98.3 F (36.8 C)     Temp Source 09/30/15 2112 Oral     SpO2 09/30/15 2112 96 %     Weight 09/30/15 2112 252 lb (114.3 kg)  Height 09/30/15 2112 5\' 9"  (1.753 m)     Head Circumference --      Peak Flow --      Pain Score 09/30/15 2107 3     Pain Loc --      Pain Edu? --      Excl. in GC? --     Constitutional: Alert and oriented. Well appearing and in no acute distress. Musculoskeletal: Positive tenderness and swelling noted to the left knee. Limited range of motion increased pain with extension. Neurologic:  Normal speech and language. No gross focal neurologic deficits are appreciated. No gait instability. Skin:  Skin is warm, dry and intact. No rash noted. Psychiatric: Mood and affect are  normal. Speech and behavior are normal.  ____________________________________________   LABS (all labs ordered are listed, but only abnormal results are displayed)  Labs Reviewed - No data to display ____________________________________________  EKG   ____________________________________________  RADIOLOGY  IMPRESSION: Joint effusion with questionable osseous irregularity of the tibia intercondylar eminence, such as related to cruciate ligament injury. Otherwise no acute fracture or dislocation identified about the left knee. ____________________________________________   PROCEDURES  Procedure(s) performed: None  Critical Care performed: No  ____________________________________________   INITIAL IMPRESSION / ASSESSMENT AND PLAN / ED COURSE  Pertinent labs & imaging results that were available during my care of the patient were reviewed by me and considered in my medical decision making (see chart for details).  Status post fall with acute knee contusion/effusion. Questionable ligament injury. Rx given for Vicodin 5/325 and crutches provided to help with ambulation. Encouraged patient to use rest ice and elevation for the next couple days to follow-up with orthopedics. He voices no other emergency medical complaints at this time.  Clinical Course    ____________________________________________   FINAL CLINICAL IMPRESSION(S) / ED DIAGNOSES  Final diagnoses:  Knee contusion, left, initial encounter  Knee effusion, left     This chart was dictated using voice recognition software/Dragon. Despite best efforts to proofread, errors can occur which can change the meaning. Any change was purely unintentional.    Evangeline Dakin, PA-C 09/30/15 2240    Phineas Semen, MD 09/30/15 [phone]

## 2015-10-24 ENCOUNTER — Other Ambulatory Visit: Payer: Self-pay | Admitting: Family Medicine

## 2015-10-28 ENCOUNTER — Other Ambulatory Visit: Payer: Self-pay

## 2015-10-28 MED ORDER — LOSARTAN POTASSIUM 100 MG PO TABS: 100.0000 mg | ORAL_TABLET | Freq: Every day | ORAL | 0 refills | Status: DC

## 2015-10-28 MED ORDER — ATORVASTATIN CALCIUM 40 MG PO TABS: 40.0000 mg | ORAL_TABLET | Freq: Every day | ORAL | 0 refills | Status: DC

## 2015-10-28 NOTE — Telephone Encounter (Signed)
Patient requesting refill of Lipitor and Losartan be sent to [facility].

## 2015-11-12 ENCOUNTER — Ambulatory Visit (INDEPENDENT_AMBULATORY_CARE_PROVIDER_SITE_OTHER): Payer: 59 | Admitting: Family Medicine

## 2015-11-12 ENCOUNTER — Encounter: Payer: Self-pay | Admitting: Family Medicine

## 2015-11-12 VITALS — BP 124/74 | HR 94 | Temp 97.9°F | Resp 16 | Ht 69.0 in | Wt 248.4 lb

## 2015-11-12 DIAGNOSIS — N183 Chronic kidney disease, stage 3 unspecified: Secondary | ICD-10-CM

## 2015-11-12 DIAGNOSIS — E559 Vitamin D deficiency, unspecified: Secondary | ICD-10-CM | POA: Diagnosis not present

## 2015-11-12 DIAGNOSIS — Z23 Encounter for immunization: Secondary | ICD-10-CM

## 2015-11-12 DIAGNOSIS — I10 Essential (primary) hypertension: Secondary | ICD-10-CM

## 2015-11-12 DIAGNOSIS — K2100 Gastro-esophageal reflux disease with esophagitis, without bleeding: Secondary | ICD-10-CM

## 2015-11-12 DIAGNOSIS — E1140 Type 2 diabetes mellitus with diabetic neuropathy, unspecified: Secondary | ICD-10-CM

## 2015-11-12 DIAGNOSIS — E785 Hyperlipidemia, unspecified: Secondary | ICD-10-CM

## 2015-11-12 DIAGNOSIS — E781 Pure hyperglyceridemia: Secondary | ICD-10-CM

## 2015-11-12 DIAGNOSIS — G4700 Insomnia, unspecified: Secondary | ICD-10-CM

## 2015-11-12 DIAGNOSIS — E1122 Type 2 diabetes mellitus with diabetic chronic kidney disease: Secondary | ICD-10-CM | POA: Diagnosis not present

## 2015-11-12 DIAGNOSIS — E1169 Type 2 diabetes mellitus with other specified complication: Secondary | ICD-10-CM

## 2015-11-12 DIAGNOSIS — N521 Erectile dysfunction due to diseases classified elsewhere: Secondary | ICD-10-CM

## 2015-11-12 LAB — POCT GLYCOSYLATED HEMOGLOBIN (HGB A1C): Hemoglobin A1C: 6.7

## 2015-11-12 MED ORDER — METFORMIN HCL ER 750 MG PO TB24: 750.0000 mg | ORAL_TABLET | Freq: Every day | ORAL | 1 refills | Status: DC

## 2015-11-12 MED ORDER — ATORVASTATIN CALCIUM 40 MG PO TABS: 40.0000 mg | ORAL_TABLET | Freq: Every day | ORAL | 1 refills | Status: DC

## 2015-11-12 MED ORDER — GLIPIZIDE ER 2.5 MG PO TB24: 2.5000 mg | ORAL_TABLET | Freq: Every day | ORAL | 0 refills | Status: DC

## 2015-11-12 MED ORDER — OMEGA-3-ACID ETHYL ESTERS 1 G PO CAPS: 1.0000 g | ORAL_CAPSULE | Freq: Two times a day (BID) | ORAL | 1 refills | Status: DC

## 2015-11-12 MED ORDER — VITAMIN D (ERGOCALCIFEROL) 1.25 MG (50000 UNIT) PO CAPS: 50000.0000 [IU] | ORAL_CAPSULE | ORAL | 0 refills | Status: DC

## 2015-11-12 MED ORDER — ZOLPIDEM TARTRATE ER 12.5 MG PO TBCR: 12.5000 mg | EXTENDED_RELEASE_TABLET | Freq: Every evening | ORAL | 2 refills | Status: DC | PRN

## 2015-11-12 MED ORDER — LOSARTAN POTASSIUM 100 MG PO TABS: 100.0000 mg | ORAL_TABLET | Freq: Every day | ORAL | 0 refills | Status: DC

## 2015-11-12 NOTE — Progress Notes (Signed)
Name: Brent Carroll   MRN: [id number]    DOB: 08-Oct-1945   Date:11/12/2015       Progress Note  Subjective  Chief Complaint  Chief Complaint  Patient presents with  . Diabetes    pt here for 3 month follow up  . Hypertension  . Hyperlipidemia    HPI  HTN: he is taking medication as prescribed, bp is at goal, no chest pain or palpitation.  Hyperlipidemia: he is taking Atorvastatin and Fenofibrate, discussed stopping Fenofibrate and starting Lovaza to control triglycerides level. He has intermittent myalgias.  DMII with CKI III and also dyslipidemia, ED: his hgbA1C is at goal today, he has changed his diet. He denies polyphagia, polyuria or polydipsia. He does not check glucose on a regular basis at home, he checks a few times monthly but not sure of the reading. No episodes of hypoglycemia. He is on ARB and Fenofibrate and we will change to Lovaza. He is not taking any medications for ED  OSA: he states Dr. Thana Ates check his sleep study, he was unable to tolerate CPAP machine, discussed the increased risk of heart attacks and strokes.   GERD: he has occasional heartburn symptoms and takes Tums prn, no regurgitation.   Insomnia: he has been on Ambien for many years and does not want to stop medication, discussed risk of medications.   Obesity: he has multiple co-morbidities, needs to lose weight, he has changed his diet and is doing better, but not at goal.   Patient Active Problem List   Diagnosis Date Noted  . Dyslipidemia associated with type 2 diabetes mellitus (HCC) 11/12/2015  . 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 Vitamin D deficiency 08/14/2015  . CN (constipation) 01/07/2015  . Hyperlipidemia 01/07/2015  . Chronic kidney disease (CKD), stage III (moderate) 08/25/2014  . Narrowing of intervertebral disc space 08/25/2014  . Type 2 diabetes mellitus with other diabetic kidney complication (HCC) 08/25/2014  . Failure of  erection 08/25/2014  . Acid reflux 08/25/2014  . 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 Obesity, Class II, BMI 35-39.9 08/25/2014  . Obstructive apnea 08/25/2014  . Benign essential HTN 03/16/2009  . Insomnia, persistent 02/10/2009    Past Surgical History:  Procedure Laterality Date  . HERNIA REPAIR    . TONSILLECTOMY      Family History  Problem Relation Age of Onset  . Heart disease Father     Social History   Social History  . Marital status: Married    Spouse name: N/A  . Number of children: N/A  . Years of education: N/A   Occupational History  . Not on file.   Social History Main Topics  . Smoking status: Former Games developer  . Smokeless tobacco: Not on file  . Alcohol use No  . Drug use: No  . Sexual activity: Not Currently    Partners: Female   Other Topics Concern  . Not on file   Social History Narrative  . No narrative on file     Current Outpatient Prescriptions:  .  atorvastatin (LIPITOR) 40 MG tablet, Take 1 tablet (40 mg total) by mouth daily., Disp: 90 tablet, Rfl: 1 .  Cholecalciferol (VITAMIN D) 2000 units CAPS, Take 1 capsule (2,000 Units total) by mouth daily., Disp: 30 capsule, Rfl:  .  glipiZIDE (GLUCOTROL XL) 2.5 MG 24 hr tablet, Take 1 tablet (2.5 mg total) by mouth daily with breakfast. In place of Glyburide, Disp: 90 tablet, Rfl: 0 .  losartan (COZAAR) 100 MG tablet, Take 1 tablet (100 mg total) by mouth daily., Disp: 90 tablet, Rfl: 0 .  metFORMIN (GLUCOPHAGE-XR) 750 MG 24 hr tablet, Take 1 tablet (750 mg total) by mouth daily with breakfast. New dose and formulation, Disp: 90 tablet, Rfl: 1 .  omega-3 acid ethyl esters (LOVAZA) 1 g capsule, Take 1 capsule (1 g total) by mouth 2 (two) times daily., Disp: 180 capsule, Rfl: 1 .  Vitamin D, Ergocalciferol, (DRISDOL) 50000 units CAPS capsule, Take 1 capsule (50,000 Units total) by mouth every 7 (seven) days., Disp: 12 capsule, Rfl: 0 .  zolpidem (AMBIEN CR) 12.5 MG CR tablet, Take 1 tablet (12.5 mg  total) by mouth at bedtime as needed. for sleep, Disp: 30 tablet, Rfl: 2  Allergies  Allergen Reactions  . Trazodone And Nefazodone      ROS  Constitutional: Negative for fever, positive  for mild  weight change.  Respiratory: Negative for cough and shortness of breath.   Cardiovascular: Negative for chest pain or palpitations.  Gastrointestinal: Negative for abdominal pain, no bowel changes.  Musculoskeletal: Negative for gait problem or joint swelling.  Skin: Negative for rash.  Neurological: Negative for dizziness or headache.  No other specific complaints in a complete review of systems (except as listed in HPI above).  Objective  Vitals:   11/12/15 0913  BP: 124/74  Pulse: 94  Resp: 16  Temp: 97.9 F (36.6 C)  SpO2: 94%  Weight: 248 lb 7 oz (112.7 kg)  Height: 5\' 9"  (1.753 m)    Body mass index is 36.69 kg/m.  Physical Exam  Constitutional: Patient appears well-developed and well-nourished. Obese  No distress.  HEENT: head atraumatic, normocephalic, pupils equal and reactive to light,  neck supple, throat within normal limits Cardiovascular: Normal rate, regular rhythm and normal heart sounds.  No murmur heard. No BLE edema. Pulmonary/Chest: Effort normal and breath sounds normal. No respiratory distress. Abdominal: Soft.  There is no tenderness. Psychiatric: Patient has a normal mood and affect. behavior is normal. Judgment and thought content normal.  Recent Results (from the past 2160 hour(s))  POCT HgB A1C     Status: None   Collection Time: 11/12/15  9:25 AM  Result Value Ref Range   Hemoglobin A1C 6.7     Diabetic Foot Exam: Diabetic Foot Exam - Simple   Simple Foot Form Diabetic Foot exam was performed with the following findings:  Yes 11/12/2015  9:48 AM  Visual Inspection See comments:  Yes Sensation Testing Intact to touch and monofilament testing bilaterally:  Yes Pulse Check Posterior Tibialis and Dorsalis pulse intact bilaterally:   Yes Comments Thick toenails      PHQ2/9: [facility] 2/9 11/12/2015 01/20/2015 01/07/2015  Decreased Interest 0 0 0  Down, Depressed, Hopeless 0 0 0  PHQ - 2 Score 0 0 0     Fall Risk: Fall Risk  11/12/2015 01/20/2015 01/07/2015 08/25/2014  Falls in the past year? Yes Yes No No  Number falls in past yr: 1 2 or more - -  Injury with Fall? Yes No - -  Risk Factor Category  High Fall Risk High Fall Risk - -  Risk for fall due to : - Impaired balance/gait;History of fall(s) - -  Follow up Falls evaluation completed Education provided - -      Functional Status Survey: Is the patient deaf or have difficulty hearing?: No Does the patient have difficulty seeing, even when wearing glasses/contacts?: Yes Does the patient  have difficulty concentrating, remembering, or making decisions?: No Does the patient have difficulty walking or climbing stairs?: Yes Does the patient have difficulty dressing or bathing?: No Does the patient have difficulty doing errands alone such as visiting a doctor's office or shopping?: No    Assessment & Plan  1. Type 2 diabetes mellitus with stage 3 chronic kidney disease, without long-term current use of insulin (HCC)  Continue ARB, change metformin to ER because he has only been taking it once daily, change from Glyburide to glipizide and take it am ( currently taking glyburide at night ), continue dietary modifications, hgbA1C is at goal today  - POCT HgB A1C6.7 % - losartan (COZAAR) 100 MG tablet; Take 1 tablet (100 mg total) by mouth daily.  Dispense: 90 tablet; Refill: 0 - metFORMIN (GLUCOPHAGE-XR) 750 MG 24 hr tablet; Take 1 tablet (750 mg total) by mouth daily with breakfast. New dose and formulation  Dispense: 90 tablet; Refill: 1 - glipiZIDE (GLUCOTROL XL) 2.5 MG 24 hr tablet; Take 1 tablet (2.5 mg total) by mouth daily with breakfast. In place of Glyburide  Dispense: 90 tablet; Refill: 0  2. Benign essential HTN  - losartan (COZAAR)  100 MG tablet; Take 1 tablet (100 mg total) by mouth daily.  Dispense: 90 tablet; Refill: 0  3. Insomnia, persistent  Discussed other options but he would like to stay on current regiment  - zolpidem (AMBIEN CR) 12.5 MG CR tablet; Take 1 tablet (12.5 mg total) by mouth at bedtime as needed. for sleep  Dispense: 30 tablet; Refill: 2  4. Hyperlipidemia  - atorvastatin (LIPITOR) 40 MG tablet; Take 1 tablet (40 mg total) by mouth daily.  Dispense: 90 tablet; Refill: 1  5. Vitamin D deficiency  - Vitamin D, Ergocalciferol, (DRISDOL) 50000 units CAPS capsule; Take 1 capsule (50,000 Units total) by mouth every 7 (seven) days.  Dispense: 12 capsule; Refill: 0  6. Obesity, Class II, BMI 35-39.9  Lost some weight since last visit   7. Gastroesophageal reflux disease with esophagitis  Symptoms are controlled at this time  8. Hypertriglyceridemia  - omega-3 acid ethyl esters (LOVAZA) 1 g capsule; Take 1 capsule (1 g total) by mouth 2 (two) times daily.  Dispense: 180 capsule; Refill: 1  9. Dyslipidemia associated with type 2 diabetes mellitus (HCC)  Changing from Fenofibrate to Lovaz - omega-3 acid ethyl esters (LOVAZA) 1 g capsule; Take 1 capsule (1 g total) by mouth 2 (two) times daily.  Dispense: 180 capsule; Refill: 1  10. 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 Needs flu shot  - Flu vaccine HIGH DOSE PF

## 2015-11-17 ENCOUNTER — Other Ambulatory Visit: Payer: Self-pay | Admitting: Family Medicine

## 2015-11-17 DIAGNOSIS — E1129 Type 2 diabetes mellitus with other diabetic kidney complication: Secondary | ICD-10-CM

## 2015-11-17 LAB — POCT UA - MICROALBUMIN: Microalbumin Ur, POC: 20 mg/L

## 2015-12-14 ENCOUNTER — Encounter: Payer: Self-pay | Admitting: Family Medicine

## 2015-12-14 ENCOUNTER — Ambulatory Visit (INDEPENDENT_AMBULATORY_CARE_PROVIDER_SITE_OTHER): Payer: 59 | Admitting: Family Medicine

## 2015-12-14 VITALS — BP 136/84 | HR 78 | Temp 98.1°F | Resp 16 | Ht 69.0 in | Wt 248.4 lb

## 2015-12-14 DIAGNOSIS — I10 Essential (primary) hypertension: Secondary | ICD-10-CM | POA: Diagnosis not present

## 2015-12-14 DIAGNOSIS — R296 Repeated falls: Secondary | ICD-10-CM

## 2015-12-14 NOTE — Progress Notes (Signed)
Name: Brent Carroll   MRN: [id number]    DOB: Jul 27, 1945   Date:12/14/2015       Progress Note  Subjective  Chief Complaint  Chief Complaint  Patient presents with  . Paperwork    Weight Form for [facility]  . Knee Pain    Due to having a fall on September 30, 2015 and fracture his left knee due to tripping at home. Patient was going to [facility] for care and has had trouble exercising due to pain from his knee.     HPI  HTN: taking medication , and bp is at goal, no chest pain or palpitation  Obesity: he lost about 12 lbs since Nov 2016. He states he has increase water intake and does feel hungry all the time like he use to. Weight has been stable since August since he fracture of tibial plateau and has not been as active. He also had his teeth pulled and not eating as much. We will fill out [facility] form since he has lost weight since last year, advised him to continue life style modification  Left Tibial Plateau fracture: he fell on 09/30/2015 and had a knee immobilizer placed by [facility] and has seen Dr. Erin Sons , he started using a walker followed by a cane now. He took Mobic initially and is doing better now, off medication   Patient Active Problem List   Diagnosis Date Noted  . Dyslipidemia associated with type 2 diabetes mellitus (HCC) 11/12/2015  . 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 Vitamin D deficiency 08/14/2015  . CN (constipation) 01/07/2015  . Hyperlipidemia 01/07/2015  . Chronic kidney disease (CKD), stage III (moderate) 08/25/2014  . Narrowing of intervertebral disc space 08/25/2014  . Type 2 diabetes mellitus with other diabetic kidney complication 08/25/2014  . Failure of erection 08/25/2014  . Acid reflux 08/25/2014  . 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 Obesity, Class II, BMI 35-39.9 08/25/2014  . Obstructive apnea 08/25/2014  . Benign essential HTN 03/16/2009  . Insomnia, persistent 02/10/2009    Past  Surgical History:  Procedure Laterality Date  . HERNIA REPAIR    . TONSILLECTOMY      Family History  Problem Relation Age of Onset  . Heart disease Father     Social History   Social History  . Marital status: Married    Spouse name: N/A  . Number of children: N/A  . Years of education: N/A   Occupational History  . Not on file.   Social History Main Topics  . Smoking status: Former Games developer  . Smokeless tobacco: Never Used  . Alcohol use No  . Drug use: No  . Sexual activity: Not Currently    Partners: Female   Other Topics Concern  . Not on file   Social History Narrative  . No narrative on file     Current Outpatient Prescriptions:  .  atorvastatin (LIPITOR) 40 MG tablet, Take 1 tablet (40 mg total) by mouth daily., Disp: 90 tablet, Rfl: 1 .  Cholecalciferol (VITAMIN D) 2000 units CAPS, Take 1 capsule (2,000 Units total) by mouth daily., Disp: 30 capsule, Rfl:  .  glipiZIDE (GLUCOTROL XL) 2.5 MG 24 hr tablet, Take 1 tablet (2.5 mg total) by mouth daily with breakfast. In place of Glyburide, Disp: 90 tablet, Rfl: 0 .  losartan (COZAAR) 100 MG tablet, Take 1 tablet (100 mg total) by mouth daily., Disp: 90 tablet, Rfl: 0 .  metFORMIN (GLUCOPHAGE-XR) 750 MG  24 hr tablet, Take 1 tablet (750 mg total) by mouth daily with breakfast. New dose and formulation, Disp: 90 tablet, Rfl: 1 .  omega-3 acid ethyl esters (LOVAZA) 1 g capsule, Take 1 capsule (1 g total) by mouth 2 (two) times daily., Disp: 180 capsule, Rfl: 1 .  Vitamin D, Ergocalciferol, (DRISDOL) 50000 units CAPS capsule, Take 1 capsule (50,000 Units total) by mouth every 7 (seven) days., Disp: 12 capsule, Rfl: 0 .  zolpidem (AMBIEN CR) 12.5 MG CR tablet, Take 1 tablet (12.5 mg total) by mouth at bedtime as needed. for sleep, Disp: 30 tablet, Rfl: 2  Allergies  Allergen Reactions  . Trazodone And Nefazodone      ROS  Ten systems reviewed and is negative except as mentioned in HPI   Objective  Vitals:    12/14/15 1033  BP: 136/84  Pulse: 78  Resp: 16  Temp: 98.1 F (36.7 C)  TempSrc: Oral  SpO2: 98%  Weight: 248 lb 6.4 oz (112.7 kg)  Height: 5\' 9"  (1.753 m)    Body mass index is 36.68 kg/m.  Physical Exam  Constitutional: Patient appears well-developed and well-nourished. Obese No distress.  HEENT: head atraumatic, normocephalic, pupils equal and reactive to light, neck supple, throat within normal limits Cardiovascular: Normal rate, regular rhythm and normal heart sounds.  No murmur heard. No BLE edema. Pulmonary/Chest: Effort normal and breath sounds normal. No respiratory distress. Abdominal: Soft.  There is no tenderness. Psychiatric: Patient has a normal mood and affect. behavior is normal. Judgment and thought content normal. Muscular Skeletal: using a cane, pain level is zero at rest at 3-4/10 with activity , normal rom of left knee  Recent Results (from the past 2160 hour(s))  POCT HgB A1C     Status: None   Collection Time: 11/12/15  9:25 AM  Result Value Ref Range   Hemoglobin A1C 6.7   POCT UA - Microalbumin     Status: None   Collection Time: 11/17/15  9:01 AM  Result Value Ref Range   Microalbumin Ur, POC 20 mg/L   Creatinine, POC  mg/dL   Albumin/Creatinine Ratio, Urine, POC        PHQ2/9: [facility] screen [facility] 12/14/2015 11/12/2015 01/20/2015 01/07/2015  Decreased Interest 0 0 0 0  Down, Depressed, Hopeless 0 0 0 0  PHQ - 2 Score 0 0 0 0     Fall Risk: Fall Risk  12/14/2015 11/12/2015 01/20/2015 01/07/2015 08/25/2014  Falls in the past year? Yes Yes Yes No No  Number falls in past yr: 2 or more 1 2 or more - -  Injury with Fall? Yes Yes No - -  Risk Factor Category  - High Fall Risk High Fall Risk - -  Risk for fall due to : - - Impaired balance/gait;History of fall(s) - -  Follow up - Falls evaluation completed Education provided - -     Functional Status Survey: Is the patient deaf or have difficulty hearing?: No Does the patient have  difficulty seeing, even when wearing glasses/contacts?: No Does the patient have difficulty concentrating, remembering, or making decisions?: No Does the patient have difficulty walking or climbing stairs?: Yes (Trouble walking since fracturing his left knee) Does the patient have difficulty dressing or bathing?: No Does the patient have difficulty doing errands alone such as visiting a doctor's office or shopping?: No    Assessment & Plan   1. Benign essential HTN  At goal  2. Morbid obesity, unspecified obesity type (  HCC)  Continue life style modification   3. Recurrent falls  Discussed PT to decrease recurrence of fractures. Seeing Dr. Gavin Potters and fracture of knee has improved, explained risk of hip fracture

## 2015-12-31 ENCOUNTER — Other Ambulatory Visit: Payer: Self-pay | Admitting: Family Medicine

## 2015-12-31 DIAGNOSIS — N183 Chronic kidney disease, stage 3 (moderate): Principal | ICD-10-CM

## 2015-12-31 DIAGNOSIS — E1122 Type 2 diabetes mellitus with diabetic chronic kidney disease: Secondary | ICD-10-CM

## 2016-01-10 ENCOUNTER — Emergency Department: Payer: 59

## 2016-01-10 ENCOUNTER — Emergency Department
Admission: EM | Admit: 2016-01-10 | Discharge: 2016-01-10 | Disposition: A | Discharge status: Home / Self Care | Payer: 59 | Attending: Emergency Medicine | Admitting: Emergency Medicine

## 2016-01-10 ENCOUNTER — Encounter: Payer: Self-pay | Admitting: Emergency Medicine

## 2016-01-10 DIAGNOSIS — S39091A Other injury of muscle, fascia and tendon of abdomen, initial encounter: Secondary | ICD-10-CM | POA: Diagnosis not present

## 2016-01-10 DIAGNOSIS — Y9389 Activity, other specified: Secondary | ICD-10-CM | POA: Diagnosis not present

## 2016-01-10 DIAGNOSIS — N183 Chronic kidney disease, stage 3 (moderate): Secondary | ICD-10-CM | POA: Insufficient documentation

## 2016-01-10 DIAGNOSIS — Z79899 Other long term (current) drug therapy: Secondary | ICD-10-CM | POA: Insufficient documentation

## 2016-01-10 DIAGNOSIS — S338XXA Sprain of other parts of lumbar spine and pelvis, initial encounter: Secondary | ICD-10-CM

## 2016-01-10 DIAGNOSIS — E1122 Type 2 diabetes mellitus with diabetic chronic kidney disease: Secondary | ICD-10-CM | POA: Diagnosis not present

## 2016-01-10 DIAGNOSIS — I129 Hypertensive chronic kidney disease with stage 1 through stage 4 chronic kidney disease, or unspecified chronic kidney disease: Secondary | ICD-10-CM | POA: Diagnosis not present

## 2016-01-10 DIAGNOSIS — Y929 Unspecified place or not applicable: Secondary | ICD-10-CM | POA: Insufficient documentation

## 2016-01-10 DIAGNOSIS — Z7984 Long term (current) use of oral hypoglycemic drugs: Secondary | ICD-10-CM | POA: Insufficient documentation

## 2016-01-10 DIAGNOSIS — Y999 Unspecified external cause status: Secondary | ICD-10-CM | POA: Diagnosis not present

## 2016-01-10 DIAGNOSIS — X58XXXA Exposure to other specified factors, initial encounter: Secondary | ICD-10-CM | POA: Insufficient documentation

## 2016-01-10 DIAGNOSIS — Z87891 Personal history of nicotine dependence: Secondary | ICD-10-CM | POA: Insufficient documentation

## 2016-01-10 DIAGNOSIS — S3991XA Unspecified injury of abdomen, initial encounter: Secondary | ICD-10-CM | POA: Diagnosis present

## 2016-01-10 LAB — CBC
HEMATOCRIT: 37.2 % — AB (ref 40.0–52.0)
HEMOGLOBIN: 13.3 g/dL (ref 13.0–18.0)
MCH: 30.7 pg (ref 26.0–34.0)
MCHC: 35.7 g/dL (ref 32.0–36.0)
MCV: 86 fL (ref 80.0–100.0)
Platelets: 230 10*3/uL (ref 150–440)
RBC: 4.33 MIL/uL — ABNORMAL LOW (ref 4.40–5.90)
RDW: 14.6 % — AB (ref 11.5–14.5)
WBC: 8.4 10*3/uL (ref 3.8–10.6)

## 2016-01-10 MED ORDER — DIAZEPAM 2 MG PO TABS: 2.0000 mg | ORAL_TABLET | Freq: Three times a day (TID) | ORAL | 0 refills | Status: DC | PRN

## 2016-01-10 MED ORDER — NAPROXEN 500 MG PO TABS
500.0000 mg | ORAL_TABLET | Freq: Once | ORAL | Status: AC
  Administered 2016-01-10: 500 mg via ORAL
  Filled 2016-01-10: qty 1

## 2016-01-10 MED ORDER — NAPROXEN 500 MG PO TABS: 500.0000 mg | ORAL_TABLET | Freq: Two times a day (BID) | ORAL | 2 refills | Status: DC

## 2016-01-10 MED ORDER — DIAZEPAM 2 MG PO TABS
2.0000 mg | ORAL_TABLET | Freq: Once | ORAL | Status: AC
  Administered 2016-01-10: 2 mg via ORAL
  Filled 2016-01-10 (×3): qty 1

## 2016-01-10 NOTE — ED Triage Notes (Addendum)
[facility] that began suddenly on Friday when he attempted to stand and had difficulty.  Patient reports recent right knee fracture but denies any history of left knee problems.  Patient reports swelling to left knee as well.  [facility] for possible [facility].  Patient denies history of blood clots.

## 2016-01-10 NOTE — ED Provider Notes (Signed)
[facility] Emergency Department Provider Note   ____________________________________________    I have reviewed the triage vital signs and the nursing notes.   HISTORY  Chief Complaint Knee Pain    HPI Brent Carroll is a 70 y.o. male who presents with complaints of left leg pain. Patient reports sharp pain in the back of his thigh when internally rotating his left leg. Denies injury to the area. No recent falls. No redness or swelling. Does report a history of diabetes. Does report pain when bearing weight. Was seen in urgent care sent here for concern for possible DVT. No history of DVTs, did fracture his knee several months ago. That is healed well. No pelvic pain   Past Medical History:  Diagnosis Date  . Diabetes mellitus without complication (HCC)   . Hyperlipidemia   . Hypertension   . Hypogonadism in male   . Insomnia   . Obesity     Patient Active Problem List   Diagnosis Date Noted  . Dyslipidemia associated with type 2 diabetes mellitus (HCC) 11/12/2015  . 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 Vitamin D deficiency 08/14/2015  . CN (constipation) 01/07/2015  . Hyperlipidemia 01/07/2015  . Chronic kidney disease (CKD), stage III (moderate) 08/25/2014  . Narrowing of intervertebral disc space 08/25/2014  . Type 2 diabetes mellitus with other diabetic kidney complication 08/25/2014  . Failure of erection 08/25/2014  . Acid reflux 08/25/2014  . 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 Obesity, Class II, BMI 35-39.9 08/25/2014  . Obstructive apnea 08/25/2014  . Benign essential HTN 03/16/2009  . Insomnia, persistent 02/10/2009    Past Surgical History:  Procedure Laterality Date  . HERNIA REPAIR    . TONSILLECTOMY      Prior to Admission medications   Medication Sig Start Date End Date Taking? Authorizing Provider  atorvastatin (LIPITOR) 40 MG tablet Take 1 tablet (40 mg total) by mouth  daily. 11/12/15   Alba Cory, MD  Cholecalciferol (VITAMIN D) 2000 units CAPS Take 1 capsule (2,000 Units total) by mouth daily. 08/14/15   Schuyler Amor, MD  diazepam (VALIUM) 2 MG tablet Take 1 tablet (2 mg total) by mouth every 8 (eight) hours as needed for muscle spasms. 01/10/16 01/09/17  Jene Every, MD  glipiZIDE (GLUCOTROL XL) 2.5 MG 24 hr tablet TAKE 1 TABLET BY MOUTH  DAILY WITH BREAKFAST, IN  PLACE OF GLYBURIDE 12/31/15   Alba Cory, MD  losartan (COZAAR) 100 MG tablet Take 1 tablet (100 mg total) by mouth daily. 11/12/15   Alba Cory, MD  metFORMIN (GLUCOPHAGE-XR) 750 MG 24 hr tablet Take 1 tablet (750 mg total) by mouth daily with breakfast. New dose and formulation 11/12/15   Alba Cory, MD  naproxen (NAPROSYN) 500 MG tablet Take 1 tablet (500 mg total) by mouth 2 (two) times daily with a meal. 01/10/16   Jene Every, MD  omega-3 acid ethyl esters (LOVAZA) 1 g capsule Take 1 capsule (1 g total) by mouth 2 (two) times daily. 11/12/15   Alba Cory, MD  Vitamin D, Ergocalciferol, (DRISDOL) 50000 units CAPS capsule Take 1 capsule (50,000 Units total) by mouth every 7 (seven) days. 11/12/15   Alba Cory, MD  zolpidem (AMBIEN CR) 12.5 MG CR tablet Take 1 tablet (12.5 mg total) by mouth at bedtime as needed. for sleep 11/12/15   Alba Cory, MD     Allergies Trazodone and nefazodone  Family History  Problem Relation Age of Onset  .  Heart disease Father     Social History Social History  Substance Use Topics  . Smoking status: Former Games developermoker  . Smokeless tobacco: Never Used  . Alcohol use No    Review of Systems  Constitutional: No fever/chills Eyes: No visual changes.  Cardiovascular: Denies chest pain. Respiratory: Denies shortness of breath.No pleurisy Gastrointestinal: No abdominal pain.  No nausea, no vomiting.    Musculoskeletal: Negative for back pain.  Skin: Negative for rash. Neurological: Negative for headaches or weakness  10-point ROS  otherwise negative.  ____________________________________________   PHYSICAL EXAM:  VITAL SIGNS: ED Triage Vitals  Enc Vitals Group     BP 01/10/16 1601 137/65     Pulse Rate 01/10/16 1601 65     Resp 01/10/16 1601 18     Temp 01/10/16 1601 97.4 F (36.3 C)     Temp Source 01/10/16 1601 Oral     SpO2 01/10/16 1601 98 %     Weight 01/10/16 1557 248 lb (112.5 kg)     Height 01/10/16 1557 5' 9.5" (1.765 m)     Head Circumference --      Peak Flow --      Pain Score 01/10/16 1558 3     Pain Loc --      Pain Edu? --      Excl. in GC? --     Constitutional: Alert and oriented. No acute distress. Pleasant and interactive Eyes: Conjunctivae are normal.   Nose: No congestion/rhinnorhea. Mouth/Throat: Mucous membranes are moist.    Cardiovascular: Normal rate, regular rhythm. Grossly normal heart sounds.  Good peripheral circulation. Respiratory: Normal respiratory effort.  No retractions. Lungs CTAB. Gastrointestinal: Soft and nontender. No distention.  No CVA tenderness. Genitourinary: deferred Musculoskeletal: Patient with point tenderness to palpation of the posterior medial thigh along the groin muscle, no erythema or swelling. Distal extremity is  Warm and well perfused Neurologic:  Normal speech and language. No gross focal neurologic deficits are appreciated.  Skin:  Skin is warm, dry and intact. No rash noted. Psychiatric: Mood and affect are normal. Speech and behavior are normal.  ____________________________________________   LABS (all labs ordered are listed, but only abnormal results are displayed)  Labs Reviewed  CBC - Abnormal; Notable for the following:       Result Value   RBC 4.33 (*)    HCT 37.2 (*)    RDW 14.6 (*)    All other components within normal limits   ____________________________________________  EKG  None ____________________________________________  RADIOLOGY  UltrasoundNegative for  DVT ____________________________________________   PROCEDURES  Procedure(s) performed: No    Critical Care performed: No ____________________________________________   INITIAL IMPRESSION / ASSESSMENT AND PLAN / ED COURSE  Pertinent labs & imaging results that were available during my care of the patient were reviewed by me and considered in my medical decision making (see chart for details).  Exam is suspicious for muscle spasm/strain. We will treat with Valium and NSAIDs and ultrasound extremity to evaluate for DVT. 2+ distal pulses, extremities are warm and well perfused  Clinical Course   Ultrasound negative for DVT we will treat for suspected muscle strain ____________________________________________   FINAL CLINICAL IMPRESSION(S) / ED DIAGNOSES  Final diagnoses:  Sprain of groin, initial encounter      NEW MEDICATIONS STARTED DURING THIS VISIT:  New Prescriptions   DIAZEPAM (VALIUM) 2 MG TABLET    Take 1 tablet (2 mg total) by mouth every 8 (eight) hours as needed for muscle spasms.  NAPROXEN (NAPROSYN) 500 MG TABLET    Take 1 tablet (500 mg total) by mouth 2 (two) times daily with a meal.     Note:  This document was prepared using Dragon voice recognition software and may include unintentional dictation errors.    Jene Everyobert Lagretta Loseke, MD 01/10/16 [id number]

## 2016-02-12 ENCOUNTER — Ambulatory Visit (INDEPENDENT_AMBULATORY_CARE_PROVIDER_SITE_OTHER): Payer: 59 | Admitting: Family Medicine

## 2016-02-12 ENCOUNTER — Encounter: Payer: Self-pay | Admitting: Family Medicine

## 2016-02-12 VITALS — BP 118/70 | HR 106 | Temp 97.6°F | Resp 18 | Ht 70.0 in | Wt 236.2 lb

## 2016-02-12 DIAGNOSIS — I10 Essential (primary) hypertension: Secondary | ICD-10-CM

## 2016-02-12 DIAGNOSIS — E785 Hyperlipidemia, unspecified: Secondary | ICD-10-CM

## 2016-02-12 DIAGNOSIS — Z1159 Encounter for screening for other viral diseases: Secondary | ICD-10-CM

## 2016-02-12 DIAGNOSIS — R251 Tremor, unspecified: Secondary | ICD-10-CM | POA: Diagnosis not present

## 2016-02-12 DIAGNOSIS — E1122 Type 2 diabetes mellitus with diabetic chronic kidney disease: Secondary | ICD-10-CM

## 2016-02-12 DIAGNOSIS — Z79899 Other long term (current) drug therapy: Secondary | ICD-10-CM

## 2016-02-12 DIAGNOSIS — N183 Chronic kidney disease, stage 3 (moderate): Secondary | ICD-10-CM

## 2016-02-12 DIAGNOSIS — R296 Repeated falls: Secondary | ICD-10-CM | POA: Diagnosis not present

## 2016-02-12 DIAGNOSIS — G4700 Insomnia, unspecified: Secondary | ICD-10-CM

## 2016-02-12 DIAGNOSIS — E559 Vitamin D deficiency, unspecified: Secondary | ICD-10-CM | POA: Diagnosis not present

## 2016-02-12 DIAGNOSIS — R2681 Unsteadiness on feet: Secondary | ICD-10-CM

## 2016-02-12 DIAGNOSIS — S76212D Strain of adductor muscle, fascia and tendon of left thigh, subsequent encounter: Secondary | ICD-10-CM

## 2016-02-12 DIAGNOSIS — R634 Abnormal weight loss: Secondary | ICD-10-CM

## 2016-02-12 DIAGNOSIS — G4733 Obstructive sleep apnea (adult) (pediatric): Secondary | ICD-10-CM

## 2016-02-12 MED ORDER — LOSARTAN POTASSIUM 100 MG PO TABS: 100.0000 mg | ORAL_TABLET | Freq: Every day | ORAL | 1 refills | Status: DC

## 2016-02-12 MED ORDER — ZOLPIDEM TARTRATE ER 6.25 MG PO TBCR
6.2500 mg | EXTENDED_RELEASE_TABLET | Freq: Every evening | ORAL | 0 refills | Status: DC | PRN
Start: 2016-02-12 — End: 2016-03-22

## 2016-02-12 NOTE — Progress Notes (Signed)
Name: Brent Carroll   MRN: [id number]    DOB: 1945/11/12   Date:02/12/2016       Progress Note  Subjective  Chief Complaint  Chief Complaint  Patient presents with  . Sleep Apnea    has improved with medication  . Insomnia    patient states that the Ambien knocks him out so he has been sleep better  . Chills    patient stated that he has noticed that he stays cold all of the time now  . Tremors    patient stated that he has noticed that he has been have a slight tremor in both hands  . Orders    family is questioning if he should possibly do physical therapy (for knee strengthening)  . Extremity Weakness    bilateral knees    HPI  Weight loss: he lost about 24 lbs since Nov 2016. He states he has increase water intake and does feel hungry all the time like he use to. Weight has been stable since August since he fracture of tibial plateau and has not been as active. He also had his teeth pulled and not eating as much. Discussed checking labs done. He smoked but quit 30 years ago, no cough or blood in stools. He has noticed intermittent chills  Left Tibial Plateau fracture: he fell on 09/30/2015 and [facility] and has seen Dr. Erin SonsHarold Kernodle , he started using a walker followed by a cane now, but since he woke up with left groin pain and diagnosed with strain he is using walker again.   Tremors and frequent falls: fail twice in the past year, fracture left tibial plateau in one of the falls. He states seen by ENT in the past and has inner ear problems, denies dizziness or hearing loss. He has noticed fine tremors at times and hands feels heavy at times, but has not dropped any objects  OSA: he does not recall having a sleep study, but based on Dr. Clance BollMorrisey's note he has a diagnoses of sleep apnea, he is on Ambien and explained importance of having one done     Patient Active Problem List   Diagnosis Date Noted  . Dyslipidemia associated with type 2  diabetes mellitus (HCC) 11/12/2015  . 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 Vitamin D deficiency 08/14/2015  . CN (constipation) 01/07/2015  . Hyperlipidemia 01/07/2015  . Chronic kidney disease (CKD), stage III (moderate) 08/25/2014  . Narrowing of intervertebral disc space 08/25/2014  . Type 2 diabetes mellitus with other diabetic kidney complication 08/25/2014  . Failure of erection 08/25/2014  . Acid reflux 08/25/2014  . 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 Obesity, Class II, BMI 35-39.9 08/25/2014  . Obstructive apnea 08/25/2014  . Benign essential HTN 03/16/2009  . Insomnia, persistent 02/10/2009    Past Surgical History:  Procedure Laterality Date  . HERNIA REPAIR    . TONSILLECTOMY      Family History  Problem Relation Age of Onset  . Heart disease Father     Social History   Social History  . Marital status: Married    Spouse name: N/A  . Number of children: N/A  . Years of education: N/A   Occupational History  . Not on file.   Social History Main Topics  . Smoking status: Former Games developermoker  . Smokeless tobacco: Never Used  . Alcohol use No  . Drug use: No  . Sexual activity: Not  Currently    Partners: Female   Other Topics Concern  . Not on file   Social History Narrative  . No narrative on file     Current Outpatient Prescriptions:  .  atorvastatin (LIPITOR) 40 MG tablet, Take 1 tablet (40 mg total) by mouth daily., Disp: 90 tablet, Rfl: 1 .  Cholecalciferol (VITAMIN D) 2000 units CAPS, Take 1 capsule (2,000 Units total) by mouth daily., Disp: 30 capsule, Rfl:  .  glipiZIDE (GLUCOTROL XL) 2.5 MG 24 hr tablet, TAKE 1 TABLET BY MOUTH  DAILY WITH BREAKFAST, IN  PLACE OF GLYBURIDE, Disp: 90 tablet, Rfl: 0 .  losartan (COZAAR) 100 MG tablet, Take 1 tablet (100 mg total) by mouth daily., Disp: 90 tablet, Rfl: 1 .  metFORMIN (GLUCOPHAGE-XR) 750 MG 24 hr tablet, Take 1 tablet (750 mg total) by mouth daily with  breakfast. New dose and formulation, Disp: 90 tablet, Rfl: 1 .  omega-3 acid ethyl esters (LOVAZA) 1 g capsule, Take 1 capsule (1 g total) by mouth 2 (two) times daily., Disp: 180 capsule, Rfl: 1 .  Vitamin D, Ergocalciferol, (DRISDOL) 50000 units CAPS capsule, Take 1 capsule (50,000 Units total) by mouth every 7 (seven) days., Disp: 12 capsule, Rfl: 0 .  zolpidem (AMBIEN CR) 6.25 MG CR tablet, Take 1 tablet (6.25 mg total) by mouth at bedtime as needed. for sleep, Disp: 30 tablet, Rfl: 0  Allergies  Allergen Reactions  . Trazodone And Nefazodone      ROS  Constitutional: Negative for fever, positive  weight change.  Respiratory: Negative for cough and shortness of breath.   Cardiovascular: Negative for chest pain or palpitations.  Gastrointestinal: Negative for abdominal pain, no bowel changes.  Musculoskeletal: positive  for gait problem but no  joint swelling.  Skin: Negative for rash.  Neurological: Negative for dizziness or headache.  No other specific complaints in a complete review of systems (except as listed in HPI above).  Objective  Vitals:   02/12/16 1024  BP: 118/70  Pulse: (!) 106  Resp: 18  Temp: 97.6 F (36.4 C)  TempSrc: Oral  SpO2: 95%  Weight: 236 lb 3.2 oz (107.1 kg)  Height: 5\' 10"  (1.778 m)    Body mass index is 33.89 kg/m.  Physical Exam  Constitutional: Patient appears well-developed and well-nourished. Obese  No distress.  HEENT: head atraumatic, normocephalic, pupils equal and reactive to light, neck supple, throat within normal limits Cardiovascular: Normal rate, regular rhythm and normal heart sounds.  No murmur heard. No BLE edema. Pulmonary/Chest: Effort normal and breath sounds normal. No respiratory distress. Abdominal: Soft.  There is no tenderness. Psychiatric: Patient has a normal mood and affect. behavior is normal. Judgment and thought content normal. Muscular Skeletal: using a walker, good rom of left knee, normal hip  exam  Recent Results (from the past 2160 hour(s))  POCT UA - Microalbumin     Status: None   Collection Time: 11/17/15  9:01 AM  Result Value Ref Range   Microalbumin Ur, POC 20 mg/L   Creatinine, POC  mg/dL   Albumin/Creatinine Ratio, Urine, POC    CBC     Status: Abnormal   Collection Time: 01/10/16  5:03 PM  Result Value Ref Range   WBC 8.4 3.8 - 10.6 K/uL   RBC 4.33 (L) 4.40 - 5.90 MIL/uL   Hemoglobin 13.3 13.0 - 18.0 g/dL   HCT 16.137.2 (L) 09.640.0 - 04.552.0 %   MCV 86.0 80.0 - 100.0 fL   MCH 30.7  26.0 - 34.0 pg   MCHC 35.7 32.0 - 36.0 g/dL   RDW 16.1 (H) 09.6 - 04.5 %   Platelets 230 150 - 440 K/uL     PHQ2/9: Depression screen [facility] 2/9 12/14/2015 11/12/2015 01/20/2015 01/07/2015  Decreased Interest 0 0 0 0  Down, Depressed, Hopeless 0 0 0 0  PHQ - 2 Score 0 0 0 0    Fall Risk: Fall Risk  12/14/2015 11/12/2015 01/20/2015 01/07/2015 08/25/2014  Falls in the past year? Yes Yes Yes No No  Number falls in past yr: 2 or more 1 2 or more - -  Injury with Fall? Yes Yes No - -  Risk Factor Category  - High Fall Risk High Fall Risk - -  Risk for fall due to : - - Impaired balance/gait;History of fall(s) - -  Follow up - Falls evaluation completed Education provided - -     Assessment & Plan  1. Gait instability  He had multiple falls in the past, per patient seen by ENT, he also has fine tremors and weight loss, discussed PT referral ( he states he wants to go )and neurologist appointment but he refuses at this time  2. Tremors of nervous system  Mild and fine on both hands, advised referral to neurologist   3. Obstructive apnea  Explained that he must have a Sleep study and how dangerous it is to take Ambien having untreated sleep apnea because it will increase the risk of death - Nocturnal polysomnography (NPSG); Future  4. Recurrent falls  -PT referral  5. Type 2 diabetes mellitus with stage 3 chronic kidney disease, without long-term current use of insulin (HCC)  -  losartan (COZAAR) 100 MG tablet; Take 1 tablet (100 mg total) by mouth daily.  Dispense: 90 tablet; Refill: 1  6. Benign essential HTN  - losartan (COZAAR) 100 MG tablet; Take 1 tablet (100 mg total) by mouth daily.  Dispense: 90 tablet; Refill: 1  7. Insomnia, persistent  We will try decreasing dose of Ambien and after that stop it if he does not have sleep study done - zolpidem (AMBIEN CR) 6.25 MG CR tablet; Take 1 tablet (6.25 mg total) by mouth at bedtime as needed. for sleep  Dispense: 30 tablet; Refill: 0  8. Groin strain, left, subsequent encounter  Went to [facility] and had nsaid's given, using a walker,    9. Need for hepatitis C screening test  - Hepatitis C antibody  10. Vitamin D deficiency  - VITAMIN D 25 Hydroxy (Vit-D Deficiency, Fractures)  11. Long-term use of high-risk medication  - COMPLETE METABOLIC PANEL WITH GFR  12. Dyslipidemia  - Lipid panel  13. Weight loss  - TSH - Sedimentation rate - C-reactive protein - PSA

## 2016-02-12 NOTE — Addendum Note (Signed)
Addended by: Lelon FrohlichGOLDEN, Fordyce Lepak D on: 02/12/2016 11:41 AM   Modules accepted: Orders

## 2016-02-13 LAB — LIPID PANEL
Chol/HDL Ratio: 4.2 ratio (ref 0.0–5.0)
Cholesterol, Total: 133 mg/dL (ref 100–199)
HDL: 32 mg/dL — ABNORMAL LOW
LDL Calculated: 63 mg/dL (ref 0–99)
Triglycerides: 188 mg/dL — ABNORMAL HIGH (ref 0–149)
VLDL Cholesterol Cal: 38 mg/dL (ref 5–40)

## 2016-02-13 LAB — HEPATITIS C ANTIBODY: Hep C Virus Ab: <0.1 {s_co_ratio} (ref 0.0–0.9)

## 2016-02-13 LAB — TSH: TSH: 1.18 u[IU]/mL (ref 0.450–4.500)

## 2016-02-13 LAB — C-REACTIVE PROTEIN: CRP: 2.8 mg/L (ref 0.0–4.9)

## 2016-02-13 LAB — SEDIMENTATION RATE: Sed Rate: 9 mm/h (ref 0–30)

## 2016-02-13 LAB — PSA: PROSTATE SPECIFIC AG, SERUM: 1.5 ng/mL (ref 0.0–4.0)

## 2016-02-13 LAB — VITAMIN D 25 HYDROXY (VIT D DEFICIENCY, FRACTURES): Vit D, 25-Hydroxy: 22.9 ng/mL — ABNORMAL LOW (ref 30.0–100.0)

## 2016-02-24 ENCOUNTER — Ambulatory Visit: Payer: 59 | Admitting: Physical Therapy

## 2016-03-22 ENCOUNTER — Encounter: Payer: Self-pay | Admitting: Family Medicine

## 2016-03-22 ENCOUNTER — Ambulatory Visit (INDEPENDENT_AMBULATORY_CARE_PROVIDER_SITE_OTHER): Payer: 59 | Admitting: Family Medicine

## 2016-03-22 VITALS — BP 116/62 | HR 103 | Temp 97.9°F | Resp 18 | Ht 70.0 in | Wt 235.3 lb

## 2016-03-22 DIAGNOSIS — I10 Essential (primary) hypertension: Secondary | ICD-10-CM | POA: Diagnosis not present

## 2016-03-22 DIAGNOSIS — Z79899 Other long term (current) drug therapy: Secondary | ICD-10-CM

## 2016-03-22 DIAGNOSIS — E1122 Type 2 diabetes mellitus with diabetic chronic kidney disease: Secondary | ICD-10-CM

## 2016-03-22 DIAGNOSIS — E1143 Type 2 diabetes mellitus with diabetic autonomic (poly)neuropathy: Secondary | ICD-10-CM

## 2016-03-22 DIAGNOSIS — N183 Chronic kidney disease, stage 3 (moderate): Secondary | ICD-10-CM

## 2016-03-22 DIAGNOSIS — E1169 Type 2 diabetes mellitus with other specified complication: Secondary | ICD-10-CM | POA: Diagnosis not present

## 2016-03-22 DIAGNOSIS — G4733 Obstructive sleep apnea (adult) (pediatric): Secondary | ICD-10-CM

## 2016-03-22 DIAGNOSIS — E559 Vitamin D deficiency, unspecified: Secondary | ICD-10-CM

## 2016-03-22 DIAGNOSIS — G4700 Insomnia, unspecified: Secondary | ICD-10-CM

## 2016-03-22 DIAGNOSIS — R634 Abnormal weight loss: Secondary | ICD-10-CM

## 2016-03-22 DIAGNOSIS — E785 Hyperlipidemia, unspecified: Secondary | ICD-10-CM | POA: Diagnosis not present

## 2016-03-22 DIAGNOSIS — E781 Pure hyperglyceridemia: Secondary | ICD-10-CM

## 2016-03-22 LAB — VITAMIN B12: VITAMIN B 12: 250 pg/mL (ref 200–1100)

## 2016-03-22 LAB — POCT GLYCOSYLATED HEMOGLOBIN (HGB A1C): HEMOGLOBIN A1C: 6.4

## 2016-03-22 MED ORDER — METFORMIN HCL ER 750 MG PO TB24: 750.0000 mg | ORAL_TABLET | Freq: Every day | ORAL | 1 refills | Status: DC

## 2016-03-22 MED ORDER — ZOLPIDEM TARTRATE ER 6.25 MG PO TBCR: 6.2500 mg | EXTENDED_RELEASE_TABLET | Freq: Every evening | ORAL | 0 refills | Status: DC | PRN

## 2016-03-22 MED ORDER — ATORVASTATIN CALCIUM 40 MG PO TABS: 40.0000 mg | ORAL_TABLET | Freq: Every day | ORAL | 1 refills | Status: DC

## 2016-03-22 MED ORDER — VITAMIN D (ERGOCALCIFEROL) 1.25 MG (50000 UNIT) PO CAPS: 50000.0000 [IU] | ORAL_CAPSULE | ORAL | 0 refills | Status: DC

## 2016-03-22 MED ORDER — GLIPIZIDE ER 2.5 MG PO TB24: 2.5000 mg | ORAL_TABLET | Freq: Every day | ORAL | 1 refills | Status: DC

## 2016-03-22 MED ORDER — OMEGA-3-ACID ETHYL ESTERS 1 G PO CAPS: 1.0000 g | ORAL_CAPSULE | Freq: Two times a day (BID) | ORAL | 1 refills | Status: DC

## 2016-03-22 NOTE — Progress Notes (Signed)
Name: Brent Carroll   MRN: [id number]    DOB: Nov 26, 1945   Date:03/22/2016       Progress Note  Subjective  Chief Complaint  Chief Complaint  Patient presents with  . Follow-up    1 month F/U  . Insomnia    Patient needs a referral for sleep study, was not ordered last visit.  . Diabetes    Patient states his bilateral feet has been tingling and burning occasionally, pt lost his meter during his last move  . Hypertension    Edema in bilateral feet  . Hyperlipidemia    HPI  HTN: he is taking medication as prescribed, bp is at goal, no chest pain or palpitation.   Hyperlipidemia: he is now on Lovaza and Atorvastatin, no side effects, HDL was better  DMII with CKI III and also dyslipidemia, ED, neuropathy: his hgbA1C is at goal today, he has changed his diet, lost weight . He denies polyphagia, polyuria or polydipsia. He does not check glucose on a regular basis at home. No episodes of hypoglycemia. He is on ARB  Lovaza, Atorvastatin . He is not taking any medications for ED. He is now having tingling on both feet and at times on hands for many months, but worse over the past few months. Better when he takes his shoes off.   OSA: he states Dr. Thana AtesMorrisey check his sleep study, he was unable to tolerate CPAP machine, discussed the increased risk of heart attacks and strokes.   GERD: he has occasional heartburn symptoms and takes Tums prn, no regurgitation.   Insomnia: he has been on Ambien for many years and does not want to stop medication, discussed risk of medications. We will refill lower dose Ambien   Obesity: he has multiple co-morbidities, he had teeth pulled 10/2015 and since new dentures he has difficulty eating, so is no longer having a lot of snacks, he has also stopped sodas Summer 2017, his weight is now stable  Weight loss: he lost about 24 lbs since Nov 2016. He states he has increase water intake and stopped drinking sodas. He also had his teeth pulled and not  eating as much. Reviewed labs with him Dec 2017  Patient Active Problem List   Diagnosis Date Noted  . Dyslipidemia associated with type 2 diabetes mellitus (HCC) 11/12/2015  . 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 Vitamin D deficiency 08/14/2015  . CN (constipation) 01/07/2015  . Hyperlipidemia 01/07/2015  . Chronic kidney disease (CKD), stage III (moderate) 08/25/2014  . Narrowing of intervertebral disc space 08/25/2014  . Type 2 diabetes mellitus with other diabetic kidney complication 08/25/2014  . Failure of erection 08/25/2014  . Acid reflux 08/25/2014  . 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 Obesity, Class II, BMI 35-39.9 08/25/2014  . Obstructive apnea 08/25/2014  . Benign essential HTN 03/16/2009  . Insomnia, persistent 02/10/2009    Past Surgical History:  Procedure Laterality Date  . HERNIA REPAIR    . TONSILLECTOMY      Family History  Problem Relation Age of Onset  . Heart disease Father     Social History   Social History  . Marital status: Married    Spouse name: N/A  . Number of children: N/A  . Years of education: N/A   Occupational History  . Not on file.   Social History Main Topics  . Smoking status: Former Games developermoker  . Smokeless tobacco: Never Used  . Alcohol use No  .  Drug use: No  . Sexual activity: Not Currently    Partners: Female   Other Topics Concern  . Not on file   Social History Narrative  . No narrative on file     Current Outpatient Prescriptions:  .  atorvastatin (LIPITOR) 40 MG tablet, Take 1 tablet (40 mg total) by mouth daily., Disp: 90 tablet, Rfl: 1 .  Cholecalciferol (VITAMIN D) 2000 units CAPS, Take 1 capsule (2,000 Units total) by mouth daily., Disp: 30 capsule, Rfl:  .  glipiZIDE (GLUCOTROL XL) 2.5 MG 24 hr tablet, Take 1 tablet (2.5 mg total) by mouth daily with breakfast., Disp: 90 tablet, Rfl: 1 .  losartan (COZAAR) 100 MG tablet, Take 1 tablet (100 mg total) by mouth  daily., Disp: 90 tablet, Rfl: 1 .  metFORMIN (GLUCOPHAGE-XR) 750 MG 24 hr tablet, Take 1 tablet (750 mg total) by mouth daily with breakfast. New dose and formulation, Disp: 90 tablet, Rfl: 1 .  omega-3 acid ethyl esters (LOVAZA) 1 g capsule, Take 1 capsule (1 g total) by mouth 2 (two) times daily., Disp: 180 capsule, Rfl: 1 .  Vitamin D, Ergocalciferol, (DRISDOL) 50000 units CAPS capsule, Take 1 capsule (50,000 Units total) by mouth every 7 (seven) days., Disp: 12 capsule, Rfl: 0 .  zolpidem (AMBIEN CR) 6.25 MG CR tablet, Take 1 tablet (6.25 mg total) by mouth at bedtime as needed. for sleep, Disp: 30 tablet, Rfl: 0  Allergies  Allergen Reactions  . Trazodone And Nefazodone      ROS  Constitutional: Negative for fever or weight change.  Respiratory: Negative for cough and shortness of breath.   Cardiovascular: Negative for chest pain or palpitations.  Gastrointestinal: Negative for abdominal pain, no bowel changes.  Musculoskeletal: Positive  for gait problem no  joint swelling.  Skin: Negative for rash.  Neurological: Negative for dizziness or headache.  No other specific complaints in a complete review of systems (except as listed in HPI above).  Objective  Vitals:   03/22/16 1033  BP: 116/62  Pulse: (!) 103  Resp: 18  Temp: 97.9 F (36.6 C)  TempSrc: Oral  SpO2: 97%  Weight: 235 lb 4.8 oz (106.7 kg)  Height: 5\' 10"  (1.778 m)    Body mass index is 33.76 kg/m.  Physical Exam  Constitutional: Patient appears well-developed and well-nourished. Obese  No distress.  HEENT: head atraumatic, normocephalic, pupils equal and reactive to light,  neck supple, throat within normal limits Cardiovascular: Normal rate, regular rhythm and normal heart sounds.  No murmur heard. No BLE edema. Pulmonary/Chest: Effort normal and breath sounds normal. No respiratory distress. Abdominal: Soft.  There is no tenderness. Psychiatric: Patient has a normal mood and affect. behavior is normal.  Judgment and thought content normal. Muscular Skeletal: still using a cane, no longer using walker  Recent Results (from the past 2160 hour(s))  CBC     Status: Abnormal   Collection Time: 01/10/16  5:03 PM  Result Value Ref Range   WBC 8.4 3.8 - 10.6 K/uL   RBC 4.33 (L) 4.40 - 5.90 MIL/uL   Hemoglobin 13.3 13.0 - 18.0 g/dL   HCT 16.1 (L) 09.6 - 04.5 %   MCV 86.0 80.0 - 100.0 fL   MCH 30.7 26.0 - 34.0 pg   MCHC 35.7 32.0 - 36.0 g/dL   RDW 40.9 (H) 81.1 - 91.4 %   Platelets 230 150 - 440 K/uL  Lipid panel     Status: Abnormal   Collection Time: 02/12/16 11:49  AM  Result Value Ref Range   Cholesterol, Total 133 100 - 199 mg/dL   Triglycerides 960 (H) 0 - 149 mg/dL   HDL 32 (L) >45 mg/dL   VLDL Cholesterol Cal 38 5 - 40 mg/dL   LDL Calculated 63 0 - 99 mg/dL   Chol/HDL Ratio 4.2 0.0 - 5.0 ratio units    Comment:                                   T. Chol/HDL Ratio                                             Men  Women                               1/2 Avg.Risk  3.4    3.3                                   Avg.Risk  5.0    4.4                                2X Avg.Risk  9.6    7.1                                3X Avg.Risk 23.4   11.0   Hepatitis C Antibody     Status: None   Collection Time: 02/12/16 11:49 AM  Result Value Ref Range   Hep C Virus Ab <0.1 0.0 - 0.9 s/co ratio    Comment:                                   Negative:     < 0.8                              Indeterminate: 0.8 - 0.9                                   Positive:     > 0.9  The CDC recommends that a positive HCV antibody result  be followed up with a HCV Nucleic Acid Amplification  test (409811).   VITAMIN D 25 Hydroxy (Vit-D Deficiency, Fractures)     Status: Abnormal   Collection Time: 02/12/16 11:49 AM  Result Value Ref Range   Vit D, 25-Hydroxy 22.9 (L) 30.0 - 100.0 ng/mL    Comment: Vitamin D deficiency has been defined by the Institute of Medicine and an Endocrine Society practice guideline as  a level of serum 25-OH vitamin D less than 20 ng/mL (1,2). The Endocrine Society went on to further define vitamin D insufficiency as a level between 21 and 29 ng/mL (2). 1. IOM (Institute of Medicine). 2010. Dietary reference    intakes for calcium and D. [place]: The    [employer]. 2. Holick  MF, Binkley [place], Bischoff-Ferrari HA, et al.    Evaluation, treatment, and prevention of vitamin D    deficiency: an Endocrine Society clinical practice    guideline. JCEM. 2011 Jul; 96(7):1911-30.   TSH     Status: None   Collection Time: 02/12/16 11:49 AM  Result Value Ref Range   TSH 1.180 0.450 - 4.500 uIU/mL  Sedimentation rate     Status: None   Collection Time: 02/12/16 11:49 AM  Result Value Ref Range   Sed Rate 9 0 - 30 mm/hr  C-reactive protein     Status: None   Collection Time: 02/12/16 11:49 AM  Result Value Ref Range   CRP 2.8 0.0 - 4.9 mg/L  PSA     Status: None   Collection Time: 02/12/16 11:49 AM  Result Value Ref Range   Prostate Specific Ag, Serum 1.5 0.0 - 4.0 ng/mL    Comment: Roche ECLIA methodology. According to the American Urological Association, Serum PSA should decrease and remain at undetectable levels after radical prostatectomy. The AUA defines biochemical recurrence as an initial PSA value 0.2 ng/mL or greater followed by a subsequent confirmatory PSA value 0.2 ng/mL or greater. Values obtained with different assay methods or kits cannot be used interchangeably. Results cannot be interpreted as absolute evidence of the presence or absence of malignant disease.   POCT HgB A1C     Status: None   Collection Time: 03/22/16 10:48 AM  Result Value Ref Range   Hemoglobin A1C 6.4       PHQ2/9: Depression screen [facility] 2/9 03/22/2016 12/14/2015 11/12/2015 01/20/2015 01/07/2015  Decreased Interest 0 0 0 0 0  Down, Depressed, Hopeless 0 0 0 0 0  PHQ - 2 Score 0 0 0 0 0    Fall Risk: Fall Risk  03/22/2016 12/14/2015 11/12/2015 01/20/2015  01/07/2015  Falls in the past year? Yes Yes Yes Yes No  Number falls in past yr: 1 2 or more 1 2 or more -  Injury with Fall? Yes Yes Yes No -  Risk Factor Category  - - High Fall Risk High Fall Risk -  Risk for fall due to : - - - Impaired balance/gait;History of fall(s) -  Follow up - - Falls evaluation completed Education provided -     Functional Status Survey: Is the patient deaf or have difficulty hearing?: No Does the patient have difficulty seeing, even when wearing glasses/contacts?: No Does the patient have difficulty concentrating, remembering, or making decisions?: No Does the patient have difficulty walking or climbing stairs?: No Does the patient have difficulty dressing or bathing?: No Does the patient have difficulty doing errands alone such as visiting a doctor's office or shopping?: No   Assessment & Plan  1. Dyslipidemia associated with type 2 diabetes mellitus (HCC)  - POCT HgB A1C - atorvastatin (LIPITOR) 40 MG tablet; Take 1 tablet (40 mg total) by mouth daily.  Dispense: 90 tablet; Refill: 1 - omega-3 acid ethyl esters (LOVAZA) 1 g capsule; Take 1 capsule (1 g total) by mouth 2 (two) times daily.  Dispense: 180 capsule; Refill: 1  2. Obstructive apnea  He can't tolerate CPAP  3. Type 2 diabetes mellitus with stage 3 chronic kidney disease, without long-term current use of insulin (HCC)  - metFORMIN (GLUCOPHAGE-XR) 750 MG 24 hr tablet; Take 1 tablet (750 mg total) by mouth daily with breakfast. New dose and formulation  Dispense: 90 tablet; Refill: 1 - glipiZIDE (GLUCOTROL XL) 2.5 MG 24 hr tablet; Take  1 tablet (2.5 mg total) by mouth daily with breakfast.  Dispense: 90 tablet; Refill: 1  4. Benign essential HTN  Well controlled  5. 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  Weight loss  He can't eat with his dentures, also drinking water and avoiding sodas, weight has stabilized and labs normal in December, we will monitor   7. Encounter for long-term (current) use of medications  - Vitamin B12  8. Vitamin D deficiency  - Vitamin D, Ergocalciferol, (DRISDOL) 50000 units CAPS capsule; Take 1 capsule (50,000 Units total) by mouth every 7 (seven) days.  Dispense: 12 capsule; Refill: 0  9. Hypertriglyceridemia  - omega-3 acid ethyl esters (LOVAZA) 1 g capsule; Take 1 capsule (1 g total) by mouth 2 (two) times daily.  Dispense: 180 capsule; Refill: 1  10. Insomnia, persistent  - zolpidem (AMBIEN CR) 6.25 MG CR tablet; Take 1 tablet (6.25 mg total) by mouth at bedtime as needed. for sleep  Dispense: 90 tablet; Refill: 0

## 2016-03-29 ENCOUNTER — Encounter: Payer: Self-pay | Admitting: Family Medicine

## 2016-04-04 ENCOUNTER — Encounter: Payer: Self-pay | Admitting: Physical Therapy

## 2016-04-04 ENCOUNTER — Telehealth: Payer: Self-pay

## 2016-04-04 ENCOUNTER — Ambulatory Visit: Payer: 59 | Attending: Family Medicine | Admitting: Physical Therapy

## 2016-04-04 DIAGNOSIS — R2689 Other abnormalities of gait and mobility: Secondary | ICD-10-CM | POA: Diagnosis present

## 2016-04-04 DIAGNOSIS — M6281 Muscle weakness (generalized): Secondary | ICD-10-CM | POA: Insufficient documentation

## 2016-04-04 NOTE — Patient Instructions (Signed)
  Clam Shell 45 Degrees   Lying with hips and knees bent 45, one p illow between knees and ankles. Lift knee with exhale. Be sure pelvis does not roll backward. Do not arch back.  20 reps on the R x 2x day   30 reps on the L x 2x day   Resting in between reps of 10    [email or web address].exer.[email or web address]   Copyright  VHI. All rights reserved.    ______   Proper body mechanics with getting out of a chair to decrease strain  on back &pelvic floor   Avoid holding your breath when Getting out of the chair:  Scoot to front part of chair chair Heels behind feet, feet are hip width apart, nose over toes  Inhale like you are smelling roses Exhale to stand     __________  Log rolling into .bed   Hook R leg under L and then prop on forearms to get into the bed then roll on back  Log rolling out of .bed  Roll all the way over on your R side with R arm overhead Then keep scooting R shoulder back to get onto forearms , use both hands to push yourself while the leg drop off   See handout

## 2016-04-04 NOTE — Telephone Encounter (Signed)
Patient Physical Therapist called to check on the status of patient's sleep study and states she is encouraging him to perform the study. Informed Dr. Glenna DurandYin we sent his paperwork into [facility] and they should be reaching out to patient.

## 2016-04-04 NOTE — Therapy (Addendum)
[facility] MAIN [facility] SERVICES [address] [place], [place], [address] Phone: [phone]   Fax:  [phone]  Physical Therapy Evaluation  Patient Details  Name: Brent Carroll MRN: [id number] Date of Birth: 12/30/1945 Referring Provider: Carlynn Purl  Encounter Date: 04/04/2016    Past Medical History:  Diagnosis Date  . Depression   . Diabetes mellitus without complication (HCC)   . Hyperlipidemia   . Hypertension   . Hypogonadism in male   . Insomnia   . Obesity   . Obstructive sleep apnea    Currently not using a CPAP    Past Surgical History:  Procedure Laterality Date  . HERNIA REPAIR    . HERNIA REPAIR Bilateral    inguinal   . TONSILLECTOMY      There were no vitals filed for this visit.       Subjective Assessment - 04/05/16 2233    Subjective L knee injury:  Pt reports sustaining a fall in 09/2015 from tripping over some toys on his back porch. He fractured his L tibia but did not require surgery. Pt did not receive PT after this fall.  Pain now is minor but his main issue is difficulty with walking. It feels heavy in the leg when he lifts it. Pt has transitioned to [facility] 3-4 weeks ago.  Pt is able to walk at church and grocery store but at a slowed pace. Pt uses the grocery cart for support.  Pt feels afraid to let go of his cane.  Pt sustained 2 falls across the past 2 years. His R LE feels pain occasionally 3-4/10 due to compensating on that leg when walking.    Pertinent History  L groin strain occured 12/2015. It occured when he got out of bed by swinging his legs off quickly.  It is no longer a hurting. Pt has a Hx of B inguinal hernial repair.  Hx of arthritis. Sleep apnea that is not managed. PT will f/u with PCP. Pt is retired; has a role in picking his grandkids from school in the afternoon.   Patient Stated Goals get back to walk without a cane             [facility] PT Assessment - 04/05/16 2230      Assessment   Medical Diagnosis gait instability    Referring Provider Sowles     Precautions   Precautions None     Restrictions   Weight Bearing Restrictions No     Balance Screen   Has the patient fallen in the past 6 months No     Home Environment   Living Environment Private residence     Prior Function   Level of Independence Independent with household mobility with device;Independent with community mobility with device     Observation/Other Assessments   Other Surveys  --  LEFS      Sit to Stand   Comments breathholding     AROM   Overall AROM Comments hip abd /ER on L limited, compensated with pelvic rotation (post Tx: pt achieved normal ROM without rocking pelvis--> increased ability to perform clam shells on LLE)      PROM   Overall PROM Comments hip ext B ~20,      Strength   Overall Strength Comments hip flex B 3+/5, knee flex/ext R: 4+/5  L: 4/5, hip abd: 2/5 B       Palpation   SI assessment  L PSIS more posterior, limited  sacral nutation      Bed Mobility   Bed Mobility --  breathholding, swinging legs     Transfers   Five time sit to stand comments  21 sec with use of armrest       Ambulation/Gait   Assistive device Straight cane   Gait Pattern Step-to pattern;Decreased stance time - left;Decreased hip/knee flexion - right;Decreased hip/knee flexion - left;Trendelenburg;Poor foot clearance - left;Poor foot clearance - right   Gait velocity .48 m/s                   [facility] Adult PT Treatment/Exercise - 04/05/16 2233      Bed Mobility   Bed Mobility --  breathholding, swinging legs     Transfers   Five time sit to stand comments  21 sec with use of armrest       Ambulation/Gait   Assistive device Straight cane   Gait Pattern Step-to pattern;Decreased stance time - left;Decreased hip/knee flexion - right;Decreased hip/knee flexion - left;Trendelenburg;Poor foot clearance - left;Poor foot clearance - right   Gait velocity .48 m/s     Therapeutic  Activites    Therapeutic Activities --  see pt instructions     Neuro Re-ed    Neuro Re-ed Details  see pt instructions                     PT Long Term Goals - 04/04/16 1129      PT LONG TERM GOAL #1   Title Pt will increase his LEFS score from 38 pt (out of 80pts) to > 43 pt in order to to amubulate safely    Time 12   Period Weeks   Status New     PT LONG TERM GOAL #2   Title Pt will increase his walking speed with SPC from 0.4 m/s to > 1.0 m/s in order to progress to walking without SPC with less risks for falls    Time 12   Period Weeks   Status New     PT LONG TERM GOAL #3   Title Pt will demo increased hip strength in hip abduction from 3/5 B to > 4/5 B in order to increase stance phase time in gait and improve balance   Time 12   Period Weeks   Status New     PT LONG TERM GOAL #4   Title Pt will demo improved 5TST time from 21 sec to < 13 sec in order to demo increase glut strength and have decreased risk for falls.    Time 12   Period Weeks   Status New               Plan - 04/05/16 2236    Clinical Impression Statement Pt is a 71 yo male who c/o difficulty walking on his LLE following a L tibia Fx from a fall that occurred 09/2015. This deficit limits his ADLs and impacts his QOL and pt would like to be able to walk without a cane.  Pt's clinical presentations include slowed and antalgic gait, hip weakness, increased risks for falls,  poor posture and body mechanics. Following Tx today, pt demo'd improved bed mobility to decrease strain of his back and groin. Pt also demo'd proper sit to stand technique. Initiated pt on hip abduction strengthening exercises. Pt was educated on the importance of being compliant with CPAP use given his Dx of OSA in the past. Pt voiced understanding. PT spoke with  RN at his PCP's office who verified that an order has been placed for a more current sleep study.  Pt will be called by the sleep clinic to set up an appt.       Rehab Potential Good   Clinical Impairments Affecting Rehab Potential Co-morbidities, B inguinal hernial repair, unmanaged OSA    PT Frequency 1x / week   PT Duration 12 weeks   PT Treatment/Interventions ADLs/Self Care Home Management;Biofeedback;Electrical Stimulation;Moist Heat;Balance training;Therapeutic exercise;Therapeutic activities;Functional mobility training;Gait training;Neuromuscular re-education;Patient/family education;Orthotic Fit/Training;Manual techniques;Taping;Energy conservation;Scar mobilization;Manual lymph drainage   Consulted and Agree with Plan of Care Patient      Patient will benefit from skilled therapeutic intervention in order to improve the following deficits and impairments:  Abnormal gait, Difficulty walking, Obesity, Decreased safety awareness, Decreased endurance, Decreased balance, Decreased mobility, Decreased strength, Postural dysfunction, Hypomobility, Improper body mechanics, Decreased activity tolerance, Pain, Decreased range of motion  Visit Diagnosis: Other abnormalities of gait and mobility  Muscle weakness (generalized)     Problem List Patient Active Problem List   Diagnosis Date Noted  . Dyslipidemia associated with type 2 diabetes mellitus (HCC) 11/12/2015  . 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 Vitamin D deficiency 08/14/2015  . CN (constipation) 01/07/2015  . Hyperlipidemia 01/07/2015  . Chronic kidney disease (CKD), stage III (moderate) 08/25/2014  . Narrowing of intervertebral disc space 08/25/2014  . Type 2 diabetes mellitus with other diabetic kidney complication 08/25/2014  . Failure of erection 08/25/2014  . Acid reflux 08/25/2014  . 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 Obesity, Class II, BMI 35-39.9 08/25/2014  . Obstructive apnea 08/25/2014  . Benign essential HTN 03/16/2009  . Insomnia, persistent 02/10/2009    Mariane MastersYeung,Shin Yiing ,PT, DPT, E-RYT  04/05/2016, 10:43 PM  [facility] [address] [place], [place], [address] Phone: [phone]   Fax:  [phone]  Name: Brent Carroll MRN: [id number] Date of Birth: [birth year]

## 2016-04-05 ENCOUNTER — Other Ambulatory Visit: Payer: Self-pay | Admitting: Family Medicine

## 2016-04-05 MED ORDER — GABAPENTIN 100 MG PO CAPS: 100.0000 mg | ORAL_CAPSULE | Freq: Every day | ORAL | 0 refills | Status: DC

## 2016-04-11 ENCOUNTER — Encounter: Payer: Self-pay | Admitting: Physical Therapy

## 2016-04-11 ENCOUNTER — Ambulatory Visit: Payer: 59 | Admitting: Physical Therapy

## 2016-04-11 DIAGNOSIS — M6281 Muscle weakness (generalized): Secondary | ICD-10-CM

## 2016-04-11 DIAGNOSIS — R2689 Other abnormalities of gait and mobility: Secondary | ICD-10-CM | POA: Diagnosis not present

## 2016-04-11 NOTE — Addendum Note (Signed)
Addended by: Mariane MastersYEUNG, SHIN-YIING on: 04/11/2016 08:18 AM   Modules accepted: Orders

## 2016-04-11 NOTE — Patient Instructions (Signed)
  ABDUCTION: Sitting - Exercise Ball: Resistance Band (Active)   Sit with feet flat. With band tied around both legs,Push both legs out to side slowly. Complete __2_ sets of __10_ repetitions. Perform _2__ sessions per day.  Copyright  VHI. All rights reserved.  FLEXION: Sitting - Resistance Band (Active)   Sit, both feet flat. Have band tied around both legs above knees, lift right knee toward ceiling.Repeat with other knee Complete _2__ sets of _10__ repetitions. Perform _2__ sessions per day.  http://gtsc.exer.us/21   Knee Extension: Resisted (Sitting)   With band looped around right ankle and under other foot, straighten leg with ankle loop. Keep other leg bent to increase resistance. Repeat _10___ times per set. Do __2__ sets per session. Do _2___ sessions per day.  http://orth.exer.us/691   Copyright  VHI. All rights reserved.  FLEXION: Sitting - Resistance Band (Active)   Sit with right foot flat. Have band tied around both feet, bend ankle, bringing toes toward head. Complete __2_ sets of __10_ repetitions. Perform _2__ sessions per day.  Copyright  VHI. All rights reserved.  HIP / KNEE: Extension - Sit to Stand   Sitting, lean chest forward, raise hips up from surface. Straighten hips and knees. Weight bear equally on left and right sides. Backs of legs should not push off surface. __10_ reps per set, __2_ sets per day, _5__ days per week Use assistive device as needed.  Copyright  VHI. All rights reserved.    Backward Walking   Walk backward, toes of each foot coming down first. Take long, even strides. Make sure you have a clear pathway with no obstructions when you do this. Stand beside counter and walk backward  And then walk forward doing opposite directions; repeat 10 laps 2x a day at least 5 days a week.  Copyright  VHI. All rights reserved.  Tandem Walking   Stand beside kitchen sink and place one foot in front of the other, lift your hand and  try to hold position for 10 sec. Repeat with other foot in front; Repeat 5 reps with each foot in front 5 days a week.Balance: Unilateral

## 2016-04-11 NOTE — Therapy (Signed)
[place] [facility] MAIN [facility] SERVICES [address] [place], [place], [address] Phone: [phone]   Fax:  [phone]  Physical Therapy Treatment  Patient Details  Name: Brent Carroll MRN: [id number] Date of Birth: 1945/06/24 Referring Provider: Carlynn Purl  Encounter Date: 04/11/2016      PT End of Session - 04/11/16 0812    Visit Number 2   Number of Visits 12   Date for PT Re-Evaluation 06/20/16   PT Start Time 0800   PT Stop Time 0845   PT Time Calculation (min) 45 min   Equipment Utilized During Treatment Gait belt   Activity Tolerance Patient tolerated treatment well;No increased pain   Behavior During Therapy WFL for tasks assessed/performed      Past Medical History:  Diagnosis Date  . Depression   . Diabetes mellitus without complication (HCC)   . Hyperlipidemia   . Hypertension   . Hypogonadism in male   . Insomnia   . Obesity   . Obstructive sleep apnea    Currently not using a CPAP    Past Surgical History:  Procedure Laterality Date  . HERNIA REPAIR    . HERNIA REPAIR Bilateral    inguinal   . TONSILLECTOMY      There were no vitals filed for this visit.      Subjective Assessment - 04/11/16 0809    Subjective Patient reports doing better with walking over last week. He reports having trouble with initial walking after sitting for a period of time. He presents to therapy with SPC. He reports starting back with [facility] since November 2017. He denies any recent falls;    Pertinent History  L groin strain occured 12/2015. It occured when he got out of bed by swinging his legs off quickly.  It is no longer a hurting. Pt has a Hx of B inguinal hernial repair.  Hx of arthritis. Sleep apnea that is not managed. PT wil f/u with PCP.     Patient Stated Goals get back to walk without a cane    Currently in Pain? Yes   Pain Score 3    Pain Location Heel   Pain Orientation Right   Pain Descriptors / Indicators Aching;Sore   Pain  Type Chronic pain       TREATMENT: Warm up on Nustep BUE/BLE level 2 x5 min (Unbilled);   Intructed patient in HEP for strengthening and balance: Seated with red tband around both legs: Hip flexion march x10 bilaterally; Hip abduction x10 bilaterally; LAQ x10 bilaterally; Ankle DF x10 bilaterally; Patient required min-moderate verbal/tactile cues for correct exercise technique including mod VCs for positioning and to increase core stabilization with LE movement for better strengthening;  Sit<>Stand without pushing on arm rests x10 reps;Required min VCs to increase forward lean for better sit<>stand ability;  Standing: Forward/backward walking on even surface x10 feet with 1 rail assist x4 laps with supervision and mod VCs to increase DF for better foot clearance; Tandem stance with 1-0 rail assist, 10 sec hold x2 each foot in front with min Vcs to increase upper trunk control for better balance control;  Leg press: BLE 75# 2x10 with min Vcs to slow down eccentric return for better strengthening; BLE heel raises 75# 2x10 with min Vcs for positioning to isolate ankle strengthening;    Patient reports slight increase in RLE quad discomfort with leg press due to cramping. He reports no pain after relaxing legs off machine.  PT Education - 04/11/16 [phone]    Education provided Yes   Education Details HEP advanced, strengthening, balance exercise;    Person(s) Educated Patient   Methods Explanation;Verbal cues;Handout   Comprehension Verbalized understanding;Returned demonstration;Verbal cues required             PT Long Term Goals - 04/04/16 1129      PT LONG TERM GOAL #1   Title Pt will increase his LEFS score from 38 pt (out of 80pts) to > 43 pt in order to to amubulate safely    Time 12   Period Weeks   Status New     PT LONG TERM GOAL #2   Title Pt will increase his walking speed with SPC from 0.4 m/s to > 1.0 m/s in order to  progress to walking without SPC with less risks for falls    Time 12   Period Weeks   Status New     PT LONG TERM GOAL #3   Title Pt will demo increased hip strength in hip abduction from 3/5 B to > 4/5 B in order to increase stance phase time in gait and improve balance   Time 12   Period Weeks   Status New     PT LONG TERM GOAL #4   Title Pt will demo improved 5TST time from 21 sec to < 13 sec in order to demo increase glut strength and have decreased risk for falls.    Time 12   Period Weeks   Status New               Plan - 04/11/16 9604    Clinical Impression Statement Patient instructed in advanced LE strengthening. Instructed patient in advanced HEP to improve LE strength and balance. Patient reuqires mod Vcs for correct exercise technique and set up. He was able to perform exercise with supervision demonstrating good safety awareness with use of railing for safety. He was able to demontsrate understanding with HEP using teach back method. patient would benefit from additional skilled PT intervention to improve LE strength, balance and gait safety to improve functional mobility;    Rehab Potential Good   Clinical Impairments Affecting Rehab Potential Co-morbidities, B inguinal hernial repair, unmanaged OSA    PT Frequency 1x / week   PT Duration 12 weeks   PT Treatment/Interventions ADLs/Self Care Home Management;Biofeedback;Electrical Stimulation;Moist Heat;Balance training;Therapeutic exercise;Therapeutic activities;Functional mobility training;Gait training;Neuromuscular re-education;Patient/family education;Orthotic Fit/Training;Manual techniques;Taping;Energy conservation;Scar mobilization;Manual lymph drainage   Consulted and Agree with Plan of Care Patient      Patient will benefit from skilled therapeutic intervention in order to improve the following deficits and impairments:  Abnormal gait, Difficulty walking, Obesity, Decreased safety awareness, Decreased  endurance, Decreased balance, Decreased mobility, Decreased strength, Postural dysfunction, Hypomobility, Improper body mechanics, Decreased activity tolerance, Pain, Decreased range of motion  Visit Diagnosis: Other abnormalities of gait and mobility  Muscle weakness (generalized)     Problem List Patient Active Problem List   Diagnosis Date Noted  . Dyslipidemia associated with type 2 diabetes mellitus (HCC) 11/12/2015  . 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 Vitamin D deficiency 08/14/2015  . CN (constipation) 01/07/2015  . Hyperlipidemia 01/07/2015  . Chronic kidney disease (CKD), stage III (moderate) 08/25/2014  . Narrowing of intervertebral disc space 08/25/2014  . Type 2 diabetes mellitus with other diabetic kidney complication 08/25/2014  . Failure of erection 08/25/2014  . Acid reflux 08/25/2014  . 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 Obesity,  Class II, BMI 35-39.9 08/25/2014  . Obstructive apnea 08/25/2014  . Benign essential HTN 03/16/2009  . Insomnia, persistent 02/10/2009    Lamees Gable PT, DPT 04/11/2016, 8:48 AM  [place] [facility] [address] [place], [place], [address] Phone: [phone]   Fax:  [phone]  Name: Arta SilenceDarrell L Branaman MRN: [id number] Date of Birth: [birth year]

## 2016-04-12 ENCOUNTER — Encounter: Payer: 59 | Admitting: Physical Therapy

## 2016-04-19 ENCOUNTER — Ambulatory Visit: Payer: 59 | Admitting: Physical Therapy

## 2016-04-19 ENCOUNTER — Encounter: Payer: Self-pay | Admitting: Physical Therapy

## 2016-04-19 ENCOUNTER — Encounter: Payer: 59 | Admitting: Physical Therapy

## 2016-04-19 DIAGNOSIS — R2689 Other abnormalities of gait and mobility: Secondary | ICD-10-CM

## 2016-04-19 DIAGNOSIS — M6281 Muscle weakness (generalized): Secondary | ICD-10-CM

## 2016-04-19 NOTE — Therapy (Signed)
[place] [facility] MAIN [facility] SERVICES [address] [place], [place], [address] Phone: [phone]   Fax:  [phone]  Physical Therapy Treatment  Patient Details  Name: Brent Carroll MRN: [id number] Date of Birth: [birth year] Referring Provider: Carlynn Purl  Encounter Date: 04/19/2016      PT End of Session - 04/19/16 0813    Visit Number 3   Number of Visits 12   Date for PT Re-Evaluation 06/20/16   PT Start Time 0804   PT Stop Time 0845   PT Time Calculation (min) 41 min   Equipment Utilized During Treatment Gait belt   Activity Tolerance Patient tolerated treatment well;No increased pain   Behavior During Therapy WFL for tasks assessed/performed      Past Medical History:  Diagnosis Date  . Depression   . Diabetes mellitus without complication (HCC)   . Hyperlipidemia   . Hypertension   . Hypogonadism in male   . Insomnia   . Obesity   . Obstructive sleep apnea    Currently not using a CPAP    Past Surgical History:  Procedure Laterality Date  . HERNIA REPAIR    . HERNIA REPAIR Bilateral    inguinal   . TONSILLECTOMY      There were no vitals filed for this visit.      Subjective Assessment - 04/19/16 0811    Subjective Patient reports doing okay today; He reports increased soreness after last session due to not exercising well; He reports no new falls; He reports being able to walk short distances without AD but that is limited. States that he has been able to negotiate stairs better reciprocally;    Pertinent History  L groin strain occured 12/2015. It occured when he got out of bed by swinging his legs off quickly.  It is no longer a hurting. Pt has a Hx of B inguinal hernial repair.  Hx of arthritis. Sleep apnea that is not managed. PT wil f/u with PCP.     Patient Stated Goals get back to walk without a cane    Currently in Pain? No/denies       TREATMENT: Warm up on Nustep BUE/BLE level 2 x5 min  (Unbilled);   Standing with red tband around both legs: Hip extension x10 bilaterally; Hip abduction x10 bilaterally Side stepping x10 feet x2 laps each direction with B rail assist;  Patient required min-moderate verbal/tactile cues for correct exercise technique including mod VCs for positioning and to reduce trunk lean for better hip strengthening;  Hooklying: Lumbar trunk rotation x1 min with cues to keep knees together for better hip stretch; Bridges with arms by side x10 with min Vcs to avoid painful ROM; SLR flexion x10 with min VCs and tactile cues to increase ROM for strengthening;  sidelying: Clamshells with red tband 2x10 with min VCs for positioning to improve hip abductor strengthening;  Leg press: BLE 75# 2x15 with min Vcs to slow down eccentric return for better strengthening; BLE heel raises 75# 2x15 with min Vcs for positioning to isolate ankle strengthening;  Patient reports slight increase in RLE quad discomfort with leg press due to cramping. He reports no pain after relaxing legs off machine.  Patient tolerated session well without increase in pain and with minimal fatigue;                          PT Education - 04/19/16 6962    Education provided Yes  Education Details HEP reinforced, strengthening, balance;    Person(s) Educated Patient   Methods Explanation;Verbal cues   Comprehension Verbalized understanding;Returned demonstration;Verbal cues required             PT Long Term Goals - 04/04/16 1129      PT LONG TERM GOAL #1   Title Pt will increase his LEFS score from 38 pt (out of 80pts) to > 43 pt in order to to amubulate safely    Time 12   Period Weeks   Status New     PT LONG TERM GOAL #2   Title Pt will increase his walking speed with SPC from 0.4 m/s to > 1.0 m/s in order to progress to walking without SPC with less risks for falls    Time 12   Period Weeks   Status New     PT LONG TERM GOAL #3   Title Pt  will demo increased hip strength in hip abduction from 3/5 B to > 4/5 B in order to increase stance phase time in gait and improve balance   Time 12   Period Weeks   Status New     PT LONG TERM GOAL #4   Title Pt will demo improved 5TST time from 21 sec to < 13 sec in order to demo increase glut strength and have decreased risk for falls.    Time 12   Period Weeks   Status New               Plan - 04/19/16 16100821    Clinical Impression Statement Patient instructed in advanced LE strengthening with min VCs for positioning to improve hip strengthening; Patient had increased difficulty with standing tband exercise due to hip weakness. He requires short rest breaks for fatigue. Patient would benefit from additional skilled PT intervention to improve LE strength, balance and gait safety to return to PLOF.    Rehab Potential Good   Clinical Impairments Affecting Rehab Potential Co-morbidities, B inguinal hernial repair, unmanaged OSA    PT Frequency 1x / week   PT Duration 12 weeks   PT Treatment/Interventions ADLs/Self Care Home Management;Biofeedback;Electrical Stimulation;Moist Heat;Balance training;Therapeutic exercise;Therapeutic activities;Functional mobility training;Gait training;Neuromuscular re-education;Patient/family education;Orthotic Fit/Training;Manual techniques;Taping;Energy conservation;Scar mobilization;Manual lymph drainage   Consulted and Agree with Plan of Care Patient      Patient will benefit from skilled therapeutic intervention in order to improve the following deficits and impairments:  Abnormal gait, Difficulty walking, Obesity, Decreased safety awareness, Decreased endurance, Decreased balance, Decreased mobility, Decreased strength, Postural dysfunction, Hypomobility, Improper body mechanics, Decreased activity tolerance, Pain, Decreased range of motion  Visit Diagnosis: Other abnormalities of gait and mobility  Muscle weakness (generalized)     Problem  List Patient Active Problem List   Diagnosis Date Noted  . Dyslipidemia associated with type 2 diabetes mellitus (HCC) 11/12/2015  . 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 Vitamin D deficiency 08/14/2015  . CN (constipation) 01/07/2015  . Hyperlipidemia 01/07/2015  . Chronic kidney disease (CKD), stage III (moderate) 08/25/2014  . Narrowing of intervertebral disc space 08/25/2014  . Type 2 diabetes mellitus with other diabetic kidney complication 08/25/2014  . Failure of erection 08/25/2014  . Acid reflux 08/25/2014  . 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 Obesity, Class II, BMI 35-39.9 08/25/2014  . Obstructive apnea 08/25/2014  . Benign essential HTN 03/16/2009  . Insomnia, persistent 02/10/2009    Kirt Chew PT, DPT 04/19/2016, 8:42 AM  [facility] MAIN  [facility] SERVICES [address] [place], [place], [address] Phone: [phone]   Fax:  [phone]  Name: Brent Carroll MRN: [id number] Date of Birth: [birth year]

## 2016-04-25 ENCOUNTER — Encounter: Payer: 59 | Admitting: Physical Therapy

## 2016-04-25 ENCOUNTER — Ambulatory Visit: Payer: 59

## 2016-04-25 DIAGNOSIS — R2689 Other abnormalities of gait and mobility: Secondary | ICD-10-CM

## 2016-04-25 DIAGNOSIS — M6281 Muscle weakness (generalized): Secondary | ICD-10-CM

## 2016-04-25 NOTE — Therapy (Signed)
[facility] MAIN [facility] SERVICES [address] [place], [place], [address] Phone: [phone]   Fax:  [phone]  Physical Therapy Treatment  Patient Details  Name: Brent Carroll MRN: [id number] Date of Birth: December 18, 1945 Referring Provider: Carlynn PurlSowles  Encounter Date: 04/25/2016      PT End of Session - 04/25/16 0909    Visit Number 4   Number of Visits 12   Date for PT Re-Evaluation 06/20/16   PT Start Time 0848   PT Stop Time 0930   PT Time Calculation (min) 42 min   Equipment Utilized During Treatment Gait belt   Activity Tolerance Patient tolerated treatment well;No increased pain   Behavior During Therapy WFL for tasks assessed/performed      Past Medical History:  Diagnosis Date  . Depression   . Diabetes mellitus without complication (HCC)   . Hyperlipidemia   . Hypertension   . Hypogonadism in male   . Insomnia   . Obesity   . Obstructive sleep apnea    Currently not using a CPAP    Past Surgical History:  Procedure Laterality Date  . HERNIA REPAIR    . HERNIA REPAIR Bilateral    inguinal   . TONSILLECTOMY      There were no vitals filed for this visit.      Subjective Assessment - 04/25/16 0854    Subjective Patient reports he has difficulty with walking backwards and walking down the stairs. Patient states he still has to walk with a SPC to get around.    Pertinent History  L groin strain occured 12/2015. It occured when he got out of bed by swinging his legs off quickly.  It is no longer a hurting. Pt has a Hx of B inguinal hernial repair.  Hx of arthritis. Sleep apnea that is not managed. PT wil f/u with PCP.     Patient Stated Goals get back to walk without a cane    Currently in Pain? No/denies      TREATMENT: Warm up on Nustep BUE/BLE level 3 x5 min with cueing on speed; seat level  Leg press: BLE 90# 2x15 with min Vcs to slow down eccentric return for better strengthening; BLE heel raises 90# 2x15  with min Vcs for positioning to isolate ankle strengthening;   Forward stepping over half foam roller - with UE support B LE x15 in  bars Side stepping up and over half foam roller - with UE support and B LE x 15  bars    Standing with red tband around both legs: Hip extension 2x10 bilaterally; Hip abduction 2x10 bilaterally  Ambulating forward with SPC with focus on improving heel strike -- 2 x 30 Backwards ambulation with SPC with focus on improving step length - 2 x 3020ft Standing mini squats with B UE support - x10           PT Education - 04/25/16 0906    Education provided Yes   Education Details form/technique throughout treatment session   Person(s) Educated Patient   Methods Explanation;Demonstration   Comprehension Verbalized understanding;Returned demonstration             PT Long Term Goals - 04/04/16 1129      PT LONG TERM GOAL #1   Title Pt will increase his LEFS score from 38 pt (out of 80pts) to > 43 pt in order to to amubulate safely    Time 12   Period Weeks   Status New  PT LONG TERM GOAL #2   Title Pt will increase his walking speed with SPC from 0.4 m/s to > 1.0 m/s in order to progress to walking without SPC with less risks for falls    Time 12   Period Weeks   Status New     PT LONG TERM GOAL #3   Title Pt will demo increased hip strength in hip abduction from 3/5 B to > 4/5 B in order to increase stance phase time in gait and improve balance   Time 12   Period Weeks   Status New     PT LONG TERM GOAL #4   Title Pt will demo improved 5TST time from 21 sec to < 13 sec in order to demo increase glut strength and have decreased risk for falls.    Time 12   Period Weeks   Status New               Plan - 04/25/16 1610    Clinical Impression Statement Advanced LE strengthening and balancing exercises to decrease fall risk and improve standing balance. Patient requires UE support to perform all standing exercises indicating  decreased dynamic/static balance and will benefit from further skilled therapy to return to prior level of function.    Rehab Potential Good   Clinical Impairments Affecting Rehab Potential Co-morbidities, B inguinal hernial repair, unmanaged OSA    PT Frequency 1x / week   PT Duration 12 weeks   PT Treatment/Interventions ADLs/Self Care Home Management;Biofeedback;Electrical Stimulation;Moist Heat;Balance training;Therapeutic exercise;Therapeutic activities;Functional mobility training;Gait training;Neuromuscular re-education;Patient/family education;Orthotic Fit/Training;Manual techniques;Taping;Energy conservation;Scar mobilization;Manual lymph drainage   Consulted and Agree with Plan of Care Patient      Patient will benefit from skilled therapeutic intervention in order to improve the following deficits and impairments:  Abnormal gait, Difficulty walking, Obesity, Decreased safety awareness, Decreased endurance, Decreased balance, Decreased mobility, Decreased strength, Postural dysfunction, Hypomobility, Improper body mechanics, Decreased activity tolerance, Pain, Decreased range of motion  Visit Diagnosis: Muscle weakness (generalized)  Other abnormalities of gait and mobility     Problem List Patient Active Problem List   Diagnosis Date Noted  . Dyslipidemia associated with type 2 diabetes mellitus (HCC) 11/12/2015  . 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 Vitamin D deficiency 08/14/2015  . CN (constipation) 01/07/2015  . Hyperlipidemia 01/07/2015  . Chronic kidney disease (CKD), stage III (moderate) 08/25/2014  . Narrowing of intervertebral disc space 08/25/2014  . Type 2 diabetes mellitus with other diabetic kidney complication 08/25/2014  . Failure of erection 08/25/2014  . Acid reflux 08/25/2014  . 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 Obesity, Class II, BMI 35-39.9 08/25/2014  . Obstructive apnea 08/25/2014  . Benign essential HTN  03/16/2009  . Insomnia, persistent 02/10/2009    Myrene Galas, PT DPT 04/25/2016, 9:39 AM  [place] MAIN [facility] SERVICES [address] [place], [place], [address] Phone: [phone]   Fax:  [phone]  Name: Brent Carroll MRN: [id number] Date of Birth: 22-Jul-1945

## 2016-05-02 ENCOUNTER — Ambulatory Visit: Payer: 59 | Attending: Family Medicine | Admitting: Physical Therapy

## 2016-05-02 ENCOUNTER — Encounter: Payer: Self-pay | Admitting: Physical Therapy

## 2016-05-02 ENCOUNTER — Encounter: Payer: 59 | Admitting: Physical Therapy

## 2016-05-02 DIAGNOSIS — R2689 Other abnormalities of gait and mobility: Secondary | ICD-10-CM | POA: Diagnosis present

## 2016-05-02 DIAGNOSIS — M6281 Muscle weakness (generalized): Secondary | ICD-10-CM | POA: Diagnosis present

## 2016-05-02 NOTE — Therapy (Signed)
[place] MAIN [facility] SERVICES [address] [place], [place], [address] Phone: [phone]   Fax:  [phone]  Physical Therapy Treatment/Progress Note  Patient Details  Name: Brent Carroll MRN: [id number] Date of Birth: 08/01/1945 Referring Provider: Ancil Boozer  Encounter Date: 05/02/2016      PT End of Session - 05/02/16 0911    Visit Number 5   Number of Visits 12   Date for PT Re-Evaluation 06/20/16   PT Start Time 0900   PT Stop Time 0950   PT Time Calculation (min) 50 min   Equipment Utilized During Treatment Gait belt   Activity Tolerance Patient tolerated treatment well;No increased pain   Behavior During Therapy WFL for tasks assessed/performed      Past Medical History:  Diagnosis Date  . Depression   . Diabetes mellitus without complication ([place])   . Hyperlipidemia   . Hypertension   . Hypogonadism in male   . Insomnia   . Obesity   . Obstructive sleep apnea    Currently not using a CPAP    Past Surgical History:  Procedure Laterality Date  . HERNIA REPAIR    . HERNIA REPAIR Bilateral    inguinal   . TONSILLECTOMY      There were no vitals filed for this visit.      Subjective Assessment - 05/02/16 0910    Subjective Patient reports doing well this morning. he reports, "I still have some left knee pain but that bothers me most of the time." He reports that it could be related to arthritis; Patient reports that he did walk some this weekend without his cane not even realizing it.    Pertinent History  L groin strain occured 12/2015. It occured when he got out of bed by swinging his legs off quickly.  It is no longer a hurting. Pt has a Hx of B inguinal hernial repair.  Hx of arthritis. Sleep apnea that is not managed. PT wil f/u with PCP.     Patient Stated Goals get back to walk without a cane    Currently in Pain? Yes   Pain Score 3    Pain Location Knee   Pain Orientation Left   Pain Descriptors /  Indicators Aching;Dull;Sore   Pain Type Chronic pain            [facility] PT Assessment - 05/02/16 0001      Observation/Other Assessments   Lower Extremity Functional Scale  42/80 (the lower the score the greater the disability, slightly improved from 38/80 at initial eval on 04/04/16)     Transfers   Five time sit to stand comments  18 sec without arm rests, (>15 sec indicates increased risk for falls, improved from 04/05/16 which was 21 sec with use of arm rests)     Ambulation/Gait   Gait velocity 0.68 m/s w/ SPC (home ambulator, limited community ambulator) improved from 04/04/16 which was 0.48 m/s      TREATMENT: Warm up on Nustep BUE/BLE level 2 x5 min (Unbilled);  Educated patient in ways to improve ease of using red band with seated HEP for better compliance; Patient required mod VCs and compensatory techniques including using cane for sliding band up/down for better ease; emphasized importance of using red band with HEP for better resistance/strengthening;   Instructed patient in 10 meter walk, 5 times sit<>stand and LEFs to assess progress towards goals; see above;   Standing with red tband around both legs: Hip  extension x15 bilaterally; Hip abduction x15  bilaterally Side stepping x10 feet x2 laps each direction with B rail assist;  Patient required min-moderate verbal/tactile cues for correct exercise techniqueincluding mod VCs for positioning and to reduce trunk lean for better hip strengthening;  Seated green tband ankle DF 2x15 bilaterally with cues to improve ROM for better strengthening;   Leg press: BLE 90# 2x12  with min Vcs to slow down eccentric return for better strengthening; BLE heel raises 90# 2x12 with min Vcs for positioning to isolate ankle strengthening;  Patient reports slight increase in RLE quad discomfort with leg press due to cramping. He reports no pain after relaxing legs off machine.  Patient tolerated session well without increase in pain  and with minimal fatigue;                         PT Education - 05/02/16 0911    Education provided Yes   Education Details strengthening, gait safety; progress towards goals, HEP advanced;    Person(s) Educated Patient   Methods Explanation;Verbal cues   Comprehension Verbalized understanding;Returned demonstration;Verbal cues required             PT Long Term Goals - 05/02/16 0940      PT LONG TERM GOAL #1   Title Pt will increase his LEFS score from 38 pt (out of 80pts) to > 43 pt in order to to amubulate safely    Time 12   Period Weeks   Status Partially Met     PT LONG TERM GOAL #2   Title Pt will increase his walking speed with SPC from 0.4 m/s to > 1.0 m/s in order to progress to walking without SPC with less risks for falls    Time 12   Period Weeks   Status Partially Met     PT LONG TERM GOAL #3   Title Pt will demo increased hip strength in hip abduction from 3/5 B to > 4/5 B in order to increase stance phase time in gait and improve balance   Time 12   Period Weeks   Status Partially Met     PT LONG TERM GOAL #4   Title Pt will demo improved 5TST time from 21 sec to < 13 sec in order to demo increase glut strength and have decreased risk for falls.    Time 12   Period Weeks   Status Partially Met               Plan - 05/02/16 1022    Clinical Impression Statement Patient instructed in outcome measures to assess progress towards goals. He is making good progress but has not quite met his goals. Patient educated in importance of using red tband for resisted exercise. He required increased cues for strategies to improve ease of don/doff band for better independence. Advanced HEP with standing tband for better hip strengthening. Patient would benefit from additional skilled PT intervention to improve strength, balance and gait safety;    Rehab Potential Good   Clinical Impairments Affecting Rehab Potential Co-morbidities, B inguinal  hernial repair, unmanaged OSA    PT Frequency 1x / week   PT Duration 12 weeks   PT Treatment/Interventions ADLs/Self Care Home Management;Biofeedback;Electrical Stimulation;Moist Heat;Balance training;Therapeutic exercise;Therapeutic activities;Functional mobility training;Gait training;Neuromuscular re-education;Patient/family education;Orthotic Fit/Training;Manual techniques;Taping;Energy conservation;Scar mobilization;Manual lymph drainage   PT Home Exercise Plan advanced- see patient instructions;    Consulted and Agree with Plan of Care Patient  Patient will benefit from skilled therapeutic intervention in order to improve the following deficits and impairments:  Abnormal gait, Difficulty walking, Obesity, Decreased safety awareness, Decreased endurance, Decreased balance, Decreased mobility, Decreased strength, Postural dysfunction, Hypomobility, Improper body mechanics, Decreased activity tolerance, Pain, Decreased range of motion  Visit Diagnosis: Muscle weakness (generalized)  Other abnormalities of gait and mobility     Problem List Patient Active Problem List   Diagnosis Date Noted  . Dyslipidemia associated with type 2 diabetes mellitus ([place]) 11/12/2015  . 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 Vitamin D deficiency 08/14/2015  . CN (constipation) 01/07/2015  . Hyperlipidemia 01/07/2015  . Chronic kidney disease (CKD), stage III (moderate) 08/25/2014  . Narrowing of intervertebral disc space 08/25/2014  . Type 2 diabetes mellitus with other diabetic kidney complication 07/08/209  . Failure of erection 08/25/2014  . Acid reflux 08/25/2014  . 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 Obesity, Class II, BMI 35-39.9 08/25/2014  . Obstructive apnea 08/25/2014  . Benign essential HTN 03/16/2009  . Insomnia, persistent 02/10/2009    Alanmichael Barmore PT, DPT 05/02/2016, 10:24 AM  [place] MAIN  [facility] SERVICES [address] [place], [place], [address] Phone: [phone]   Fax:  [phone]  Name: Brent Carroll MRN: [id number] Date of Birth: August 07, 1945

## 2016-05-02 NOTE — Patient Instructions (Signed)
Balance, Proprioception: Hip Abduction With Tubing   With tubing attached to both ankles, Standing holding onto counter, kick one leg out to side and then Return.  Repeat _10___ times  On each side.  Do ___2_ sessions per day.  [email or web address].[email or web address]   Balance, Proprioception: Hip Extension With Tubing   With tubing tied around both legs, holding onto kitchen counter, swing leg back. Return. Repeat _10___ times . Do __2__ sessions per day.  [email or web address].[email or web address]    Band Walk: Side Stepping   Tie band around legs, AROUND ANKLES. Step _10__ feet to one side, then step back to start. Repeat _2-3__ feet per session. Note: Small towel between band and skin eases rubbing.  [email or web address].[email or web address]    

## 2016-05-05 ENCOUNTER — Other Ambulatory Visit: Payer: Self-pay | Admitting: Family Medicine

## 2016-05-05 MED ORDER — GABAPENTIN 300 MG PO CAPS: 300.0000 mg | ORAL_CAPSULE | Freq: Every day | ORAL | 0 refills | Status: DC

## 2016-05-05 NOTE — Telephone Encounter (Signed)
Pt states he was told that when he got low on his Gabapentin that Dr Carlynn PurlSowles would refill it for 300 mgs 1 capsule a day to be sent to [facility]. Pt is requesting this be filled.

## 2016-05-09 ENCOUNTER — Encounter: Payer: Self-pay | Admitting: Physical Therapy

## 2016-05-09 ENCOUNTER — Ambulatory Visit: Payer: 59 | Admitting: Physical Therapy

## 2016-05-09 DIAGNOSIS — R2689 Other abnormalities of gait and mobility: Secondary | ICD-10-CM

## 2016-05-09 DIAGNOSIS — M6281 Muscle weakness (generalized): Secondary | ICD-10-CM | POA: Diagnosis not present

## 2016-05-09 NOTE — Therapy (Signed)
[place] MAIN [facility] SERVICES [address] [place], [place], [address] Phone: [phone]   Fax:  [phone]  Physical Therapy Treatment  Patient Details  Name: Brent Carroll MRN: [id number] Date of Birth: 11-14-1945 Referring Provider: Ancil Boozer  Encounter Date: 05/09/2016      PT End of Session - 05/09/16 0952    Visit Number 6   Number of Visits 12   Date for PT Re-Evaluation 06/20/16   PT Start Time 0958   PT Stop Time 1040   PT Time Calculation (min) 42 min   Equipment Utilized During Treatment Gait belt   Activity Tolerance Patient tolerated treatment well;No increased pain   Behavior During Therapy WFL for tasks assessed/performed      Past Medical History:  Diagnosis Date  . Depression   . Diabetes mellitus without complication ([place])   . Hyperlipidemia   . Hypertension   . Hypogonadism in male   . Insomnia   . Obesity   . Obstructive sleep apnea    Currently not using a CPAP    Past Surgical History:  Procedure Laterality Date  . HERNIA REPAIR    . HERNIA REPAIR Bilateral    inguinal   . TONSILLECTOMY      There were no vitals filed for this visit.      Subjective Assessment - 05/09/16 0959    Subjective Patient reports doing well; He reports still having soreness at end of day after doing a lot of walking or standing;    Pertinent History  L groin strain occured 12/2015. It occured when he got out of bed by swinging his legs off quickly.  It is no longer a hurting. Pt has a Hx of B inguinal hernial repair.  Hx of arthritis. Sleep apnea that is not managed. PT wil f/u with PCP.     Patient Stated Goals get back to walk without a cane    Currently in Pain? Yes   Pain Score 3    Pain Location Knee   Pain Orientation Left;Posterior   Pain Descriptors / Indicators Aching;Dull   Pain Type Chronic pain   Pain Onset More than a month ago   Pain Frequency Occasional   Aggravating Factors  prolonged  standing/walking;    Pain Relieving Factors rest      TREATMENT:    Warm up on Nustep BUE/BLE level 2 x4 min (Unbilled);  Standing hamstring stretch 15 sec hold x2 bilaterally with foot on step with min VCs for positioning to improve LE stretch;  Seated ankle DF with green tband resistance 2x15 bilaterally with min VCs to slow down LE movement for better strengthening;   Standing with green tband around both legs: Hip extension x10 bilaterally; Hip abduction x10   bilaterally Side stepping x10 feet x2 laps each direction with B rail assist;  Standing red tband hip flexion x15 bilaterally with tactile cues to improve ROM;  Patient required min verbal/tactile cues for correct exercise techniqueincluding mod VCs for positioning and to reduce trunk lean for better hip strengthening;  Seated green tband ankle DF 2x15 bilaterally with cues to improve ROM for better strengthening;   Side step ups on 4 inch step x10 bilaterally with rail assist for balance;  Leg press: BLE 90# 2x15 with min Vcs to slow down eccentric return for better strengthening; BLE heel raises 90# 2x15with min Vcs for positioning to isolate ankle strengthening;   Resisted walking 12.5# forward/backward, 1 way, x2 laps with min  A for safety and cues to slow down LE movement for better control; Alternate toe taps on 4 inch step with 1-0 rail assist with min A for balance and cues to improve weight shift for better stance control and foot clearance;     Patient tolerated session well without increase in pain and with minimal fatigue;                         PT Education - 05/09/16 0952    Education provided Yes   Education Details strengthening, gait asfety, HEP reinforced;    Person(s) Educated Patient   Methods Explanation;Verbal cues   Comprehension Verbalized understanding;Returned demonstration;Verbal cues required             PT Long Term Goals - 05/02/16 0940       PT LONG TERM GOAL #1   Title Pt will increase his LEFS score from 38 pt (out of 80pts) to > 43 pt in order to to amubulate safely    Time 12   Period Weeks   Status Partially Met     PT LONG TERM GOAL #2   Title Pt will increase his walking speed with SPC from 0.4 m/s to > 1.0 m/s in order to progress to walking without SPC with less risks for falls    Time 12   Period Weeks   Status Partially Met     PT LONG TERM GOAL #3   Title Pt will demo increased hip strength in hip abduction from 3/5 B to > 4/5 B in order to increase stance phase time in gait and improve balance   Time 12   Period Weeks   Status Partially Met     PT LONG TERM GOAL #4   Title Pt will demo improved 5TST time from 21 sec to < 13 sec in order to demo increase glut strength and have decreased risk for falls.    Time 12   Period Weeks   Status Partially Met               Plan - 05/09/16 1014    Clinical Impression Statement Patient instructed in advanced LE strengthening and balance exercise. He was able to walk further in gym without AD with less assistance. Patient continues to have weakness with prolonged standing. He would benefit from additional skilled PT intervention to improve balance/gait safety and LE strength for improved ADLs.    Rehab Potential Good   Clinical Impairments Affecting Rehab Potential Co-morbidities, B inguinal hernial repair, unmanaged OSA    PT Frequency 1x / week   PT Duration 12 weeks   PT Treatment/Interventions ADLs/Self Care Home Management;Biofeedback;Electrical Stimulation;Moist Heat;Balance training;Therapeutic exercise;Therapeutic activities;Functional mobility training;Gait training;Neuromuscular re-education;Patient/family education;Orthotic Fit/Training;Manual techniques;Taping;Energy conservation;Scar mobilization;Manual lymph drainage   PT Home Exercise Plan advanced- see patient instructions;    Consulted and Agree with Plan of Care Patient      Patient will  benefit from skilled therapeutic intervention in order to improve the following deficits and impairments:  Abnormal gait, Difficulty walking, Obesity, Decreased safety awareness, Decreased endurance, Decreased balance, Decreased mobility, Decreased strength, Postural dysfunction, Hypomobility, Improper body mechanics, Decreased activity tolerance, Pain, Decreased range of motion  Visit Diagnosis: Muscle weakness (generalized)  Other abnormalities of gait and mobility     Problem List Patient Active Problem List   Diagnosis Date Noted  . Dyslipidemia associated with type 2 diabetes mellitus ([place]) 11/12/2015  . Diabetes mellitus with neuropathy causing erectile  dysfunction ([place]) 11/12/2015  . Vitamin D deficiency 08/14/2015  . CN (constipation) 01/07/2015  . Hyperlipidemia 01/07/2015  . Chronic kidney disease (CKD), stage III (moderate) 08/25/2014  . Narrowing of intervertebral disc space 08/25/2014  . Type 2 diabetes mellitus with other diabetic kidney complication 02/77/4128  . Failure of erection 08/25/2014  . Acid reflux 08/25/2014  . 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 Obesity, Class II, BMI 35-39.9 08/25/2014  . Obstructive apnea 08/25/2014  . Benign essential HTN 03/16/2009  . Insomnia, persistent 02/10/2009    Alcide Memoli PT, DPT 05/09/2016, 10:59 AM  [place] MAIN [facility] SERVICES [address] [place], [place], [address] Phone: [phone]   Fax:  [phone]  Name: Brent Carroll MRN: [id number] Date of Birth: 28-Sep-1945

## 2016-05-16 ENCOUNTER — Ambulatory Visit: Payer: 59 | Admitting: Physical Therapy

## 2016-05-16 ENCOUNTER — Encounter: Payer: Self-pay | Admitting: Physical Therapy

## 2016-05-16 DIAGNOSIS — R2689 Other abnormalities of gait and mobility: Secondary | ICD-10-CM

## 2016-05-16 DIAGNOSIS — M6281 Muscle weakness (generalized): Secondary | ICD-10-CM

## 2016-05-16 NOTE — Therapy (Signed)
[place] MAIN [facility] SERVICES [address] [place], [place], [address] Phone: [phone]   Fax:  [phone]  Physical Therapy Treatment  Patient Details  Name: Brent Carroll MRN: [id number] Date of Birth: 01/19/1946 Referring Provider: Ancil Boozer  Encounter Date: 05/16/2016      PT End of Session - 05/16/16 1042    Visit Number 7   Number of Visits 12   Date for PT Re-Evaluation 06/20/16   PT Start Time 8016   PT Stop Time 1115   PT Time Calculation (min) 43 min   Equipment Utilized During Treatment Gait belt   Activity Tolerance Patient tolerated treatment well;No increased pain   Behavior During Therapy WFL for tasks assessed/performed      Past Medical History:  Diagnosis Date  . Depression   . Diabetes mellitus without complication ([place])   . Hyperlipidemia   . Hypertension   . Hypogonadism in male   . Insomnia   . Obesity   . Obstructive sleep apnea    Currently not using a CPAP    Past Surgical History:  Procedure Laterality Date  . HERNIA REPAIR    . HERNIA REPAIR Bilateral    inguinal   . TONSILLECTOMY      There were no vitals filed for this visit.      Subjective Assessment - 05/16/16 1041    Subjective Patient reports doing well; Reports compliance with HEP; reports still having pain at beginning of morning and less pain with mobility;    Pertinent History  L groin strain occured 12/2015. It occured when he got out of bed by swinging his legs off quickly.  It is no longer a hurting. Pt has a Hx of B inguinal hernial repair.  Hx of arthritis. Sleep apnea that is not managed. PT wil f/u with PCP.     Patient Stated Goals get back to walk without a cane    Currently in Pain? No/denies   Pain Onset More than a month ago           TREATMENT:  Warm up on Nustep BUE/BLE level 2 x5 min (Unbilled);  Seated hamstring stretch 15 sec hold x2 bilaterally with min VCs for positioning to improve LE  stretch;  Seated ankle DF with green tband resistance 2x15 bilaterally with min VCs to slow down LE movement for better strengthening;   Standing with green tband around both legs: Hip extension x12 bilaterally; Hip abduction x12  bilaterally Side stepping x10 feet x3 laps each direction with B rail assist; Patient required min verbal/tactile cues for correct exercise techniqueincluding mod VCs for positioning and to reduce trunk lean for better hip strengthening;   Forward step ups on 4 inch step x10 bilaterally with 1 rail assist for balance;  Leg press: BLE 105# 2x12with min Vcs to slow down eccentric return for better strengthening; BLE heel raises 105# 2x12 with min Vcs for positioning to isolate ankle strengthening;   Balance: Alternate toe taps on 4 inch step with 1-0 rail assist x15 reps with min A for balance and cues to improve weight shift for better stance control and foot clearance;   Gait weaving through cones #6 x4 reps with min A for safety and cues to increase ankle DF at heel strike for better foot clearance and less forefoot contact at initial swing (L>R); Patient demonstrates increased lateral sway due to weakness; Able to complete without AD with min A for safety;    Patient tolerated  session well without increase in pain and with minimal fatigue;                         PT Education - 05/16/16 1042    Education provided Yes   Education Details strengthening, gait safety, HEP reinforced;    Person(s) Educated Patient   Methods Explanation;Verbal cues   Comprehension Verbalized understanding;Returned demonstration;Verbal cues required             PT Long Term Goals - 05/02/16 0940      PT LONG TERM GOAL #1   Title Pt will increase his LEFS score from 38 pt (out of 80pts) to > 43 pt in order to to amubulate safely    Time 12   Period Weeks   Status Partially Met     PT LONG TERM GOAL #2   Title Pt will increase his  walking speed with SPC from 0.4 m/s to > 1.0 m/s in order to progress to walking without SPC with less risks for falls    Time 12   Period Weeks   Status Partially Met     PT LONG TERM GOAL #3   Title Pt will demo increased hip strength in hip abduction from 3/5 B to > 4/5 B in order to increase stance phase time in gait and improve balance   Time 12   Period Weeks   Status Partially Met     PT LONG TERM GOAL #4   Title Pt will demo improved 5TST time from 21 sec to < 13 sec in order to demo increase glut strength and have decreased risk for falls.    Time 12   Period Weeks   Status Partially Met               Plan - 05/16/16 1050    Clinical Impression Statement Patient instructed in advanced LE strengthening and balance exercise. He requires cues for correct exercise technique and to improve posture for better hip strengthening; He reports less discomfort with advanced exercise. He continues to fatigue quickly; He would benefit from additional skilled PT intervention to improve strength, balance/gait safety and reduce fall risk;    Rehab Potential Good   Clinical Impairments Affecting Rehab Potential Co-morbidities, B inguinal hernial repair, unmanaged OSA    PT Frequency 1x / week   PT Duration 12 weeks   PT Treatment/Interventions ADLs/Self Care Home Management;Biofeedback;Electrical Stimulation;Moist Heat;Balance training;Therapeutic exercise;Therapeutic activities;Functional mobility training;Gait training;Neuromuscular re-education;Patient/family education;Orthotic Fit/Training;Manual techniques;Taping;Energy conservation;Scar mobilization;Manual lymph drainage   PT Home Exercise Plan continue as given;    Consulted and Agree with Plan of Care Patient      Patient will benefit from skilled therapeutic intervention in order to improve the following deficits and impairments:  Abnormal gait, Difficulty walking, Obesity, Decreased safety awareness, Decreased endurance,  Decreased balance, Decreased mobility, Decreased strength, Postural dysfunction, Hypomobility, Improper body mechanics, Decreased activity tolerance, Pain, Decreased range of motion  Visit Diagnosis: Muscle weakness (generalized)  Other abnormalities of gait and mobility     Problem List Patient Active Problem List   Diagnosis Date Noted  . Dyslipidemia associated with type 2 diabetes mellitus ([place]) 11/12/2015  . 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 Vitamin D deficiency 08/14/2015  . CN (constipation) 01/07/2015  . Hyperlipidemia 01/07/2015  . Chronic kidney disease (CKD), stage III (moderate) 08/25/2014  . Narrowing of intervertebral disc space 08/25/2014  . Type 2 diabetes mellitus with other  diabetic kidney complication 15/94/5859  . Failure of erection 08/25/2014  . Acid reflux 08/25/2014  . 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 Obesity, Class II, BMI 35-39.9 08/25/2014  . Obstructive apnea 08/25/2014  . Benign essential HTN 03/16/2009  . Insomnia, persistent 02/10/2009    Aldwin Micalizzi PT, DPT 05/16/2016, 1:52 PM  [place] [facility] MAIN [facility] SERVICES [address] [place], [place], [address] Phone: [phone]   Fax:  [phone]  Name: Brent Carroll MRN: [id number] Date of Birth: [birth year]

## 2016-05-23 ENCOUNTER — Encounter: Payer: Self-pay | Admitting: Physical Therapy

## 2016-05-23 ENCOUNTER — Ambulatory Visit: Payer: 59 | Admitting: Physical Therapy

## 2016-05-23 DIAGNOSIS — M6281 Muscle weakness (generalized): Secondary | ICD-10-CM

## 2016-05-23 DIAGNOSIS — R2689 Other abnormalities of gait and mobility: Secondary | ICD-10-CM

## 2016-05-23 NOTE — Therapy (Signed)
[place] MAIN [facility] SERVICES [address] [place], [place], [address] Phone: [phone]   Fax:  [phone]  Physical Therapy Treatment  Patient Details  Name: Brent Carroll MRN: [id number] Date of Birth: January 20, 1946 Referring Provider: Ancil Boozer  Encounter Date: 05/23/2016      PT End of Session - 05/23/16 1038    Visit Number 8   Number of Visits 12   Date for PT Re-Evaluation 06/20/16   PT Start Time 6226   PT Stop Time 1115   PT Time Calculation (min) 43 min   Equipment Utilized During Treatment Gait belt   Activity Tolerance Patient tolerated treatment well;No increased pain   Behavior During Therapy WFL for tasks assessed/performed      Past Medical History:  Diagnosis Date  . Depression   . Diabetes mellitus without complication ([place])   . Hyperlipidemia   . Hypertension   . Hypogonadism in male   . Insomnia   . Obesity   . Obstructive sleep apnea    Currently not using a CPAP    Past Surgical History:  Procedure Laterality Date  . HERNIA REPAIR    . HERNIA REPAIR Bilateral    inguinal   . TONSILLECTOMY      There were no vitals filed for this visit.      Subjective Assessment - 05/23/16 1038    Subjective patient reports doing well; He reports walking some without AD, but is still slow with gait; Denies any pain currently;    Pertinent History  L groin strain occured 12/2015. It occured when he got out of bed by swinging his legs off quickly.  It is no longer a hurting. Pt has a Hx of B inguinal hernial repair.  Hx of arthritis. Sleep apnea that is not managed. PT wil f/u with PCP.     Patient Stated Goals get back to walk without a cane    Currently in Pain? No/denies   Pain Onset More than a month ago          TREATMENT: Warm up on Nustep BUE/BLE level 2 x11mn (Unbilled);  Seated hamstring stretch 15 sec hold x2 bilaterally with min VCs for positioning to improve LE stretch;  Standing with  green tband around both legs: Hip extension x12 bilaterally; Hip abduction x12 bilaterally Side stepping x10 feet x3 laps each direction with B rail assist; Patient required min verbal/tactile cues for correct exercise techniqueincluding mod VCs for positioning and to reduce trunk lean for better hip strengthening;   Leg press: BLE 105# 2x15 with min Vcs to slow down eccentric return for better strengthening; BLE heel raises 105# 2x15  with min Vcs for positioning to isolate ankle strengthening;   Balance: Resisted walking 12.5# forward/backward, side/side x2 way, x2 laps each direction with min A for safety and min VCs to improve weight shift for better gait safety;  Alternate toe taps on 4 inch step with 0 rail assist x15 reps with CGA for balance and cues to improve weight shift for better stance control and foot clearance;   Ascend/descend 4 steps with 1 rail assist x3 sets with cues for reciprocal step ascend/descend, requiring min A for safety especially with descending;   Gait stepping over 1/2 and small bolster unsupported without SPC x4 laps with CGA to close supervision;   Patient tolerated session well without increase in pain and with minimal fatigue;  PT Education - 05/23/16 1039    Education provided Yes   Education Details strengthening, gait safety, HEP reinforced;    Person(s) Educated Patient   Methods Explanation;Verbal cues   Comprehension Verbalized understanding;Returned demonstration;Verbal cues required             PT Long Term Goals - 05/02/16 0940      PT LONG TERM GOAL #1   Title Pt will increase his LEFS score from 38 pt (out of 80pts) to > 43 pt in order to to amubulate safely    Time 12   Period Weeks   Status Partially Met     PT LONG TERM GOAL #2   Title Pt will increase his walking speed with SPC from 0.4 m/s to > 1.0 m/s in order to progress to walking without SPC with less risks for  falls    Time 12   Period Weeks   Status Partially Met     PT LONG TERM GOAL #3   Title Pt will demo increased hip strength in hip abduction from 3/5 B to > 4/5 B in order to increase stance phase time in gait and improve balance   Time 12   Period Weeks   Status Partially Met     PT LONG TERM GOAL #4   Title Pt will demo improved 5TST time from 21 sec to < 13 sec in order to demo increase glut strength and have decreased risk for falls.    Time 12   Period Weeks   Status Partially Met               Plan - 05/23/16 1049    Clinical Impression Statement Patient instructed in advanced LE strengthening exercise; He requires cues for correct positioning and weight shift for better strengthening; Patient also instructed in advanced balance exercise with dynamic weight shifts to improve gait ability; He would benefit from additional skilled PT Intervention to improve balance, gait safety and LE strengthening for return to PLOF.    Rehab Potential Good   Clinical Impairments Affecting Rehab Potential Co-morbidities, B inguinal hernial repair, unmanaged OSA    PT Frequency 1x / week   PT Duration 12 weeks   PT Treatment/Interventions ADLs/Self Care Home Management;Biofeedback;Electrical Stimulation;Moist Heat;Balance training;Therapeutic exercise;Therapeutic activities;Functional mobility training;Gait training;Neuromuscular re-education;Patient/family education;Orthotic Fit/Training;Manual techniques;Taping;Energy conservation;Scar mobilization;Manual lymph drainage   PT Home Exercise Plan continue as given;    Consulted and Agree with Plan of Care Patient      Patient will benefit from skilled therapeutic intervention in order to improve the following deficits and impairments:  Abnormal gait, Difficulty walking, Obesity, Decreased safety awareness, Decreased endurance, Decreased balance, Decreased mobility, Decreased strength, Postural dysfunction, Hypomobility, Improper body  mechanics, Decreased activity tolerance, Pain, Decreased range of motion  Visit Diagnosis: Muscle weakness (generalized)  Other abnormalities of gait and mobility     Problem List Patient Active Problem List   Diagnosis Date Noted  . Dyslipidemia associated with type 2 diabetes mellitus ([place]) 11/12/2015  . 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 Vitamin D deficiency 08/14/2015  . CN (constipation) 01/07/2015  . Hyperlipidemia 01/07/2015  . Chronic kidney disease (CKD), stage III (moderate) 08/25/2014  . Narrowing of intervertebral disc space 08/25/2014  . Type 2 diabetes mellitus with other diabetic kidney complication 62/95/2841  . Failure of erection 08/25/2014  . Acid reflux 08/25/2014  . 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 Obesity, Class II, BMI 35-39.9 08/25/2014  . Obstructive apnea 08/25/2014  .  Benign essential HTN 03/16/2009  . Insomnia, persistent 02/10/2009    Montrice Gracey PT, DPT 05/23/2016, 1:10 PM  [place] MAIN [facility] SERVICES [address] [place], [place], [address] Phone: [phone]   Fax:  [phone]  Name: Brent Carroll MRN: [id number] Date of Birth: 01-Aug-1945

## 2016-05-30 ENCOUNTER — Encounter: Payer: Self-pay | Admitting: Physical Therapy

## 2016-05-30 ENCOUNTER — Ambulatory Visit: Payer: 59 | Attending: Family Medicine | Admitting: Physical Therapy

## 2016-05-30 DIAGNOSIS — R2689 Other abnormalities of gait and mobility: Secondary | ICD-10-CM | POA: Diagnosis present

## 2016-05-30 DIAGNOSIS — M6281 Muscle weakness (generalized): Secondary | ICD-10-CM | POA: Diagnosis not present

## 2016-05-30 NOTE — Therapy (Signed)
[place] MAIN [facility] SERVICES [address] [place], [place], [address] Phone: [phone]   Fax:  [phone]  Physical Therapy Treatment  Patient Details  Name: Brent Carroll MRN: [id number] Date of Birth: [birth year] Referring Provider: Ancil Boozer  Encounter Date: 05/30/2016      PT End of Session - 05/30/16 1046    Visit Number 9   Number of Visits 12   Date for PT Re-Evaluation 06/20/16   PT Start Time 6010   PT Stop Time 1130   PT Time Calculation (min) 45 min   Equipment Utilized During Treatment Gait belt   Activity Tolerance Patient tolerated treatment well;No increased pain   Behavior During Therapy WFL for tasks assessed/performed      Past Medical History:  Diagnosis Date  . Depression   . Diabetes mellitus without complication ([place])   . Hyperlipidemia   . Hypertension   . Hypogonadism in male   . Insomnia   . Obesity   . Obstructive sleep apnea    Currently not using a CPAP    Past Surgical History:  Procedure Laterality Date  . HERNIA REPAIR    . HERNIA REPAIR Bilateral    inguinal   . TONSILLECTOMY      There were no vitals filed for this visit.      Subjective Assessment - 05/30/16 1045    Subjective Patient reports not sleeping well last night; He reports doing pretty good with walking; He has been walking without the cane most of the time; Denies any pain currently;    Pertinent History  L groin strain occured 12/2015. It occured when he got out of bed by swinging his legs off quickly.  It is no longer a hurting. Pt has a Hx of B inguinal hernial repair.  Hx of arthritis. Sleep apnea that is not managed. PT wil f/u with PCP.     Patient Stated Goals get back to walk without a cane    Currently in Pain? No/denies   Pain Onset More than a month ago          TREATMENT: Warm up on Nustep BUE/BLE level 2 x83mn (Unbilled);  Standing with green tband around both legs: Hip extension x15  bilaterally; Hip abduction x15bilaterally Side stepping x10 feet x3laps each direction with B rail assist; Patient required min verbal/tactile cues for correct exercise techniqueincluding mod VCs for positioning and to reduce trunk lean for better hip strengthening;   Leg press: BLE 120# 2x10 with min Vcs to slow down eccentric return for better strengthening; BLE heel raises 120# 2x10 with min Vcs for positioning to isolate ankle strengthening;   Sit<>stand with 3# overhead lift x10 with cues to increase forward lean for better transfers;  Balance: Ascend/descend 4 steps with 1 rail assist x2 sets with cues for reciprocal step ascend/descend, requiring min A for safety especially with descending;   Ascend/descend curb with SPC x2 reps, without SPC x2 reps with close supervision and min VCs to improve foot placement, weight shift and upper trunk control for better dynamic balance; Gait on thick grass x100 feet with SPC, supervision with min Vcs for foot clearance;  Patient ambulated on even surface x500 feet with mild increase in fatigue with and without SPC demonstrating increased foot drag and increased lateral trunk sway with prolonged ambulation: Required min VCs to increase step length and improve foot clearance for better gait safety;  Patient tolerated session well without increase in pain and with minimal  fatigue;                           PT Education - 05/30/16 1046    Education provided Yes   Education Details strengthening, gait safety, HEP reinforced;    Person(s) Educated Patient   Methods Explanation;Verbal cues   Comprehension Verbalized understanding;Returned demonstration;Verbal cues required             PT Long Term Goals - 05/02/16 0940      PT LONG TERM GOAL #1   Title Pt will increase his LEFS score from 38 pt (out of 80pts) to > 43 pt in order to to amubulate safely    Time 12   Period Weeks   Status Partially Met      PT LONG TERM GOAL #2   Title Pt will increase his walking speed with SPC from 0.4 m/s to > 1.0 m/s in order to progress to walking without SPC with less risks for falls    Time 12   Period Weeks   Status Partially Met     PT LONG TERM GOAL #3   Title Pt will demo increased hip strength in hip abduction from 3/5 B to > 4/5 B in order to increase stance phase time in gait and improve balance   Time 12   Period Weeks   Status Partially Met     PT LONG TERM GOAL #4   Title Pt will demo improved 5TST time from 21 sec to < 13 sec in order to demo increase glut strength and have decreased risk for falls.    Time 12   Period Weeks   Status Partially Met               Plan - 05/30/16 1058    Clinical Impression Statement Instructed patient in advanced LE strengthening; He does require cues for correct positioning and exercise technique for best strengthening; Patient also instructed in dynamic balance/gait safety when walking outside on uneven surfaces; He does require close supervision to CGA when walking without AD on thick grass and up/down curbs. He would benefit from additional skilled PT intervention to improve balance, strength and gait safety;    Rehab Potential Good   Clinical Impairments Affecting Rehab Potential Co-morbidities, B inguinal hernial repair, unmanaged OSA    PT Frequency 1x / week   PT Duration 12 weeks   PT Treatment/Interventions ADLs/Self Care Home Management;Biofeedback;Electrical Stimulation;Moist Heat;Balance training;Therapeutic exercise;Therapeutic activities;Functional mobility training;Gait training;Neuromuscular re-education;Patient/family education;Orthotic Fit/Training;Manual techniques;Taping;Energy conservation;Scar mobilization;Manual lymph drainage   PT Home Exercise Plan continue as given;    Consulted and Agree with Plan of Care Patient      Patient will benefit from skilled therapeutic intervention in order to improve the following deficits  and impairments:  Abnormal gait, Difficulty walking, Obesity, Decreased safety awareness, Decreased endurance, Decreased balance, Decreased mobility, Decreased strength, Postural dysfunction, Hypomobility, Improper body mechanics, Decreased activity tolerance, Pain, Decreased range of motion  Visit Diagnosis: Muscle weakness (generalized)  Other abnormalities of gait and mobility     Problem List Patient Active Problem List   Diagnosis Date Noted  . Dyslipidemia associated with type 2 diabetes mellitus ([place]) 11/12/2015  . 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 Vitamin D deficiency 08/14/2015  . CN (constipation) 01/07/2015  . Hyperlipidemia 01/07/2015  . Chronic kidney disease (CKD), stage III (moderate) 08/25/2014  . Narrowing of intervertebral disc space 08/25/2014  . Type 2  diabetes mellitus with other diabetic kidney complication 82/80/0349  . Failure of erection 08/25/2014  . Acid reflux 08/25/2014  . 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 Obesity, Class II, BMI 35-39.9 08/25/2014  . Obstructive apnea 08/25/2014  . Benign essential HTN 03/16/2009  . Insomnia, persistent 02/10/2009    Jashawn Floyd PT, DPT 05/30/2016, 11:28 AM  [place] MAIN [facility] SERVICES [address] [place], [place], [address] Phone: [phone]   Fax:  [phone]  Name: Brent Carroll MRN: [id number] Date of Birth: [birth year]

## 2016-06-06 ENCOUNTER — Ambulatory Visit: Payer: 59 | Admitting: Physical Therapy

## 2016-06-06 ENCOUNTER — Encounter: Payer: Self-pay | Admitting: Physical Therapy

## 2016-06-06 DIAGNOSIS — R2689 Other abnormalities of gait and mobility: Secondary | ICD-10-CM

## 2016-06-06 DIAGNOSIS — M6281 Muscle weakness (generalized): Secondary | ICD-10-CM

## 2016-06-06 NOTE — Therapy (Signed)
[place] MAIN [facility] SERVICES [address] [place], [place], [address] Phone: [phone]   Fax:  [phone]  Physical Therapy Treatment/Discharge Summary  Patient Details  Name: Brent Carroll MRN: [id number] Date of Birth: January 18, 1946 Referring Provider: Ancil Boozer  Encounter Date: 06/06/2016      PT End of Session - 06/06/16 1042    Visit Number 10   Number of Visits 12   Date for PT Re-Evaluation 06/20/16   PT Start Time 0962   PT Stop Time 1110   PT Time Calculation (min) 38 min   Equipment Utilized During Treatment Gait belt   Activity Tolerance Patient tolerated treatment well;No increased pain   Behavior During Therapy WFL for tasks assessed/performed      Past Medical History:  Diagnosis Date  . Depression   . Diabetes mellitus without complication ([place])   . Hyperlipidemia   . Hypertension   . Hypogonadism in male   . Insomnia   . Obesity   . Obstructive sleep apnea    Currently not using a CPAP    Past Surgical History:  Procedure Laterality Date  . HERNIA REPAIR    . HERNIA REPAIR Bilateral    inguinal   . TONSILLECTOMY      There were no vitals filed for this visit.      Subjective Assessment - 06/06/16 1041    Subjective Patient reports doing well this morning; He reports some soreness over the weekend due to bad weather; Denies any pain this morning; Reports being able to go up a small hill at his daughter's house without the cane without difficulty;    Pertinent History  L groin strain occured 12/2015. It occured when he got out of bed by swinging his legs off quickly.  It is no longer a hurting. Pt has a Hx of B inguinal hernial repair.  Hx of arthritis. Sleep apnea that is not managed. PT wil f/u with PCP.     Patient Stated Goals get back to walk without a cane    Currently in Pain? No/denies   Pain Onset More than a month ago            [facility] PT Assessment - 06/06/16 0001      Observation/Other  Assessments   Lower Extremity Functional Scale  54/80 (The lower the score the greater the disability, improved from 05/02/16 which was 42/80)     Strength   Overall Strength Comments BLE hip grosssly 4+/5, knee 4+/5, except LLE hamstring 4/5, ankle 5/5     Transfers   Five time sit to stand comments  13.5 sec without pushing on chair; improved from 05/02/16 which was 18 sec;     Ambulation/Gait   Gait velocity 0.95 m/s without SPC (community ambulator speed, low fall risk, improved from 05/02/16 which was 0.68 m/s with SPC); patient does have increased lateral trunk lean due to imbalance;      TREATMENT: Warm up on Nustep BUE/BLE level 2 x4 min (Unbilled);  Instructed patient in 10 meter walk, 5 times sit<>stand and LEFs to assess progress towards goals; see above;   Patient demonstrates significant improvement in goals. He has met most goals at this time. PT reinforced HEP with instruction to continue with standing resisted exercise with tband exercise. Patient verbalized understanding of HEP; He reports feeling comfortable with discharge from PT at this time.  PT Education - 2016-07-06 1042    Education provided Yes   Education Details strengthening, gait safety; progress towards goals;    Person(s) Educated Patient   Methods Explanation;Verbal cues   Comprehension Verbalized understanding;Returned demonstration;Verbal cues required             PT Long Term Goals - 06-Jul-2016 1043      PT LONG TERM GOAL #1   Title Pt will increase his LEFS score from 38 pt (out of 80pts) to > 43 pt in order to to amubulate safely    Time 12   Period Weeks   Status Achieved     PT LONG TERM GOAL #2   Title Pt will increase his walking speed with SPC from 0.4 m/s to > 1.0 m/s in order to progress to walking without SPC with less risks for falls    Baseline 0.95 m/s without SPC   Time 12   Period Weeks   Status Partially Met     PT LONG TERM GOAL  #3   Title Pt will demo increased hip strength in hip abduction from 3/5 B to > 4/5 B in order to increase stance phase time in gait and improve balance   Time 12   Period Weeks   Status Achieved     PT LONG TERM GOAL #4   Title Pt will demo improved 5TST time from 21 sec to < 13 sec in order to demo increase glut strength and have decreased risk for falls.    Time 12   Period Weeks   Status Achieved               Plan - 07-06-16 1150    Clinical Impression Statement Patient instructed in 10 meter walk, 5 times sit<>stand and other outcome measures to assess progress towards goals. He has met most goals. He is walking in his home consistently without SPC. He reports still using cane when walking on uneven surfaces, but reports that this is more of a precaution. He is independent in HEP. PT reinforced HEP with importance of still working on standing strengthening exercise with tband. Patient verbalized understanding. Recommend discharge from PT at this time as patient has met most goals. Patient agreeable.    Rehab Potential Good   Clinical Impairments Affecting Rehab Potential Co-morbidities, B inguinal hernial repair, unmanaged OSA    PT Frequency 1x / week   PT Duration 12 weeks   PT Treatment/Interventions ADLs/Self Care Home Management;Biofeedback;Electrical Stimulation;Moist Heat;Balance training;Therapeutic exercise;Therapeutic activities;Functional mobility training;Gait training;Neuromuscular re-education;Patient/family education;Orthotic Fit/Training;Manual techniques;Taping;Energy conservation;Scar mobilization;Manual lymph drainage   PT Home Exercise Plan continue as given;    Consulted and Agree with Plan of Care Patient      Patient will benefit from skilled therapeutic intervention in order to improve the following deficits and impairments:  Abnormal gait, Difficulty walking, Obesity, Decreased safety awareness, Decreased endurance, Decreased balance, Decreased  mobility, Decreased strength, Postural dysfunction, Hypomobility, Improper body mechanics, Decreased activity tolerance, Pain, Decreased range of motion  Visit Diagnosis: Muscle weakness (generalized)  Other abnormalities of gait and mobility       G-Codes - Jul 06, 2016 1152    Functional Assessment Tool Used (Outpatient Only) 10 meter walk, 5 times sit<>stand, clinical judgement;    Functional Limitation Mobility: Walking and moving around   Mobility: Walking and Moving Around Goal Status [phone]) At least 1 percent but less than 20 percent impaired, limited or restricted   Mobility: Walking and Moving Around Discharge Status [phone]) At least  1 percent but less than 20 percent impaired, limited or restricted      Problem List Patient Active Problem List   Diagnosis Date Noted  . Dyslipidemia associated with type 2 diabetes mellitus ([place]) 11/12/2015  . 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 Vitamin D deficiency 08/14/2015  . CN (constipation) 01/07/2015  . Hyperlipidemia 01/07/2015  . Chronic kidney disease (CKD), stage III (moderate) 08/25/2014  . Narrowing of intervertebral disc space 08/25/2014  . Type 2 diabetes mellitus with other diabetic kidney complication 12/81/1886  . Failure of erection 08/25/2014  . Acid reflux 08/25/2014  . 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 Obesity, Class II, BMI 35-39.9 08/25/2014  . Obstructive apnea 08/25/2014  . Benign essential HTN 03/16/2009  . Insomnia, persistent 02/10/2009    Sorayah Schrodt PT, DPT 06/06/2016, 11:53 AM  [place] MAIN [facility] SERVICES [address] [place], [place], [address] Phone: [phone]   Fax:  [phone]  Name: Brent Carroll MRN: [id number] Date of Birth: April 26, 1945

## 2016-06-13 ENCOUNTER — Encounter: Payer: 59 | Admitting: Physical Therapy

## 2016-06-20 ENCOUNTER — Encounter: Payer: 59 | Admitting: Physical Therapy

## 2016-06-23 ENCOUNTER — Other Ambulatory Visit: Payer: Self-pay | Admitting: Family Medicine

## 2016-06-27 ENCOUNTER — Encounter: Payer: 59 | Admitting: Physical Therapy

## 2016-07-08 ENCOUNTER — Telehealth: Payer: Self-pay | Admitting: Family Medicine

## 2016-07-08 ENCOUNTER — Ambulatory Visit (INDEPENDENT_AMBULATORY_CARE_PROVIDER_SITE_OTHER): Payer: 59 | Admitting: Family Medicine

## 2016-07-08 ENCOUNTER — Encounter: Payer: Self-pay | Admitting: Family Medicine

## 2016-07-08 VITALS — BP 118/64 | HR 81 | Temp 98.2°F | Resp 16 | Ht 70.0 in | Wt 244.4 lb

## 2016-07-08 DIAGNOSIS — E1169 Type 2 diabetes mellitus with other specified complication: Secondary | ICD-10-CM | POA: Diagnosis not present

## 2016-07-08 DIAGNOSIS — E785 Hyperlipidemia, unspecified: Secondary | ICD-10-CM

## 2016-07-08 DIAGNOSIS — E1143 Type 2 diabetes mellitus with diabetic autonomic (poly)neuropathy: Secondary | ICD-10-CM | POA: Diagnosis not present

## 2016-07-08 DIAGNOSIS — E1122 Type 2 diabetes mellitus with diabetic chronic kidney disease: Secondary | ICD-10-CM

## 2016-07-08 DIAGNOSIS — G4700 Insomnia, unspecified: Secondary | ICD-10-CM | POA: Diagnosis not present

## 2016-07-08 DIAGNOSIS — E781 Pure hyperglyceridemia: Secondary | ICD-10-CM

## 2016-07-08 DIAGNOSIS — E559 Vitamin D deficiency, unspecified: Secondary | ICD-10-CM

## 2016-07-08 DIAGNOSIS — I10 Essential (primary) hypertension: Secondary | ICD-10-CM

## 2016-07-08 DIAGNOSIS — N183 Chronic kidney disease, stage 3 unspecified: Secondary | ICD-10-CM

## 2016-07-08 DIAGNOSIS — E538 Deficiency of other specified B group vitamins: Secondary | ICD-10-CM | POA: Diagnosis not present

## 2016-07-08 DIAGNOSIS — G4733 Obstructive sleep apnea (adult) (pediatric): Secondary | ICD-10-CM

## 2016-07-08 LAB — POCT GLYCOSYLATED HEMOGLOBIN (HGB A1C): HEMOGLOBIN A1C: 6.3

## 2016-07-08 MED ORDER — GABAPENTIN 300 MG PO CAPS: 300.0000 mg | ORAL_CAPSULE | Freq: Every day | ORAL | 0 refills | Status: DC

## 2016-07-08 MED ORDER — GLIPIZIDE ER 2.5 MG PO TB24: 2.5000 mg | ORAL_TABLET | Freq: Every day | ORAL | 1 refills | Status: DC

## 2016-07-08 MED ORDER — CYANOCOBALAMIN 1000 MCG/ML IJ SOLN
1000.0000 ug | Freq: Once | INTRAMUSCULAR | Status: AC
  Administered 2016-07-08: 1000 ug via INTRAMUSCULAR

## 2016-07-08 MED ORDER — VITAMIN D (ERGOCALCIFEROL) 1.25 MG (50000 UNIT) PO CAPS: 50000.0000 [IU] | ORAL_CAPSULE | ORAL | 0 refills | Status: DC

## 2016-07-08 MED ORDER — OMEGA-3-ACID ETHYL ESTERS 1 G PO CAPS: 1.0000 g | ORAL_CAPSULE | Freq: Two times a day (BID) | ORAL | 1 refills | Status: DC

## 2016-07-08 MED ORDER — METFORMIN HCL ER 750 MG PO TB24: 750.0000 mg | ORAL_TABLET | Freq: Every day | ORAL | 1 refills | Status: DC

## 2016-07-08 MED ORDER — ATORVASTATIN CALCIUM 40 MG PO TABS: 40.0000 mg | ORAL_TABLET | Freq: Every day | ORAL | 1 refills | Status: DC

## 2016-07-08 MED ORDER — LOSARTAN POTASSIUM 100 MG PO TABS: 100.0000 mg | ORAL_TABLET | Freq: Every day | ORAL | 1 refills | Status: DC

## 2016-07-08 MED ORDER — ZOLPIDEM TARTRATE ER 6.25 MG PO TBCR: 6.2500 mg | EXTENDED_RELEASE_TABLET | Freq: Every evening | ORAL | 0 refills | Status: DC | PRN

## 2016-07-08 MED ORDER — B-12 1000 MCG SL SUBL: 1.0000 ug | SUBLINGUAL_TABLET | Freq: Every day | SUBLINGUAL | 1 refills | Status: DC

## 2016-07-08 NOTE — Telephone Encounter (Signed)
I told him to call back on Monday to let us know when [facility]

## 2016-07-08 NOTE — Telephone Encounter (Signed)
Pt was here today and was told Zolpidem could be called in for a few days while he is waiting on [facility].

## 2016-07-08 NOTE — Progress Notes (Signed)
Name: Brent Carroll   MRN: [id number]    DOB: 04/12/1945   Date:07/08/2016       Progress Note  Subjective  Chief Complaint  Chief Complaint  Patient presents with  . Diabetes    4 month follow up  . Hypertension  . Obesity    HPI  HTN: he is taking medication as prescribed, bp is at goal, no chest pain or palpitation.   Hyperlipidemia: he is now on Lovaza and Atorvastatin, no side effects, HDL and triglycerides improved, but still not at goal, discussed life style changes  DMII with CKI III and also dyslipidemia, ED, neuropathy: his hgbA1C is at goal today 6.3%, he has gained weight - 9 lbs since last visit - he states that he has been eating more - he had dentures placed Nov 2017 and is still adjusting to it.  He denies polyphagia, polyuria or polydipsia. He does not check glucose on a regular basis at home. No episodes of hypoglycemia. He is on ARB  Lovaza, Atorvastatin, he ran out of Gabapentin because he got new dose but still took 3 at night . He is not taking any medications for ED.Gabapentin helped with pain on both feet, he also has B12 deficiency and needs to have it replaced. Gabapentin helped him sleep better at night  OSA: he states Dr. Thana Ates check his sleep study, he was unable to tolerate CPAP machine, discussed the increased risk of heart attacks and strokes.   GERD: he has occasional heartburn symptoms and takes Tums prn, no regurgitation.   Insomnia: he has been on Ambien for many years and does not want to stop medication, discussed risk of medications. He is on lower dose of Ambien CR, he states when he ran out of gabapentin he had more difficulty sleeping, he asked to go up on Ambien dose, but we will refill Gabapentin instead.   Obesity: he has multiple co-morbidities, he had teeth pulled 10/2015 and since new dentures 12/2015  he has difficulty eating, so is no longer having a lot of snacks, he has also stopped sodas Summer 2017, he gained weight since  last visit  Patient Active Problem List   Diagnosis Date Noted  . B12 deficiency 07/08/2016  . Dyslipidemia associated with type 2 diabetes mellitus ([facility]) 11/12/2015  . 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 Vitamin D deficiency 08/14/2015  . CN (constipation) 01/07/2015  . Hyperlipidemia 01/07/2015  . Chronic kidney disease (CKD), stage III (moderate) 08/25/2014  . Narrowing of intervertebral disc space 08/25/2014  . Type 2 diabetes mellitus with other diabetic kidney complication ([facility]) 08/25/2014  . Failure of erection 08/25/2014  . Acid reflux 08/25/2014  . 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 Obesity, Class II, BMI 35-39.9 08/25/2014  . Obstructive apnea 08/25/2014  . Benign essential HTN 03/16/2009  . Insomnia, persistent 02/10/2009    Past Surgical History:  Procedure Laterality Date  . HERNIA REPAIR    . HERNIA REPAIR Bilateral    inguinal   . TONSILLECTOMY      Family History  Problem Relation Age of Onset  . Heart disease Father   . COPD Father     Social History   Social History  . Marital status: Married    Spouse name: N/A  . Number of children: N/A  . Years of education: N/A   Occupational History  . Not on file.   Social History Main Topics  . Smoking status: Former Smoker  Quit date: 04/04/1986  . Smokeless tobacco: Never Used  . Alcohol use No  . Drug use: No  . Sexual activity: Not Currently    Partners: Female   Other Topics Concern  . Not on file   Social History Narrative  . No narrative on file     Current Outpatient Prescriptions:  .  atorvastatin (LIPITOR) 40 MG tablet, Take 1 tablet (40 mg total) by mouth daily., Disp: 90 tablet, Rfl: 1 .  Cholecalciferol (VITAMIN D) 2000 units CAPS, Take 1 capsule (2,000 Units total) by mouth daily., Disp: 30 capsule, Rfl:  .  Cyanocobalamin (B-12) 1000 MCG SUBL, Place 1 mcg under the tongue daily., Disp: 90 each, Rfl: 1 .  gabapentin (NEURONTIN) 300  MG capsule, Take 1-2 capsules (300-600 mg total) by mouth at bedtime., Disp: 180 capsule, Rfl: 0 .  glipiZIDE (GLUCOTROL XL) 2.5 MG 24 hr tablet, Take 1 tablet (2.5 mg total) by mouth daily with breakfast., Disp: 90 tablet, Rfl: 1 .  losartan (COZAAR) 100 MG tablet, Take 1 tablet (100 mg total) by mouth daily., Disp: 90 tablet, Rfl: 1 .  metFORMIN (GLUCOPHAGE-XR) 750 MG 24 hr tablet, Take 1 tablet (750 mg total) by mouth daily with breakfast. New dose and formulation, Disp: 90 tablet, Rfl: 1 .  omega-3 acid ethyl esters (LOVAZA) 1 g capsule, Take 1 capsule (1 g total) by mouth 2 (two) times daily., Disp: 180 capsule, Rfl: 1 .  Vitamin D, Ergocalciferol, (DRISDOL) 50000 units CAPS capsule, Take 1 capsule (50,000 Units total) by mouth every 7 (seven) days., Disp: 12 capsule, Rfl: 0 .  zolpidem (AMBIEN CR) 6.25 MG CR tablet, Take 1 tablet (6.25 mg total) by mouth at bedtime as needed. for sleep, Disp: 90 tablet, Rfl: 0  Current Facility-Administered Medications:  .  cyanocobalamin ((VITAMIN B-12)) injection 1,000 mcg, 1,000 mcg, Intramuscular, Once, Alba Cory, MD  Allergies  Allergen Reactions  . Trazodone And Nefazodone      ROS  Constitutional: Negative for fever or weight change.  Respiratory: Negative for cough and shortness of breath.   Cardiovascular: Negative for chest pain or palpitations.  Gastrointestinal: Negative for abdominal pain, no bowel changes.  Musculoskeletal: Negative for gait problem or joint swelling.  Skin: Negative for rash.  Neurological: Negative for dizziness or headache.  No other specific complaints in a complete review of systems (except as listed in HPI above).  Objective  Vitals:   07/08/16 0942  BP: 118/64  Pulse: 81  Resp: 16  Temp: 98.2 F (36.8 C)  SpO2: 96%  Weight: 244 lb 6 oz (110.8 kg)  Height: 5\' 10"  (1.778 m)    Body mass index is 35.06 kg/m.  Physical Exam  Constitutional: Patient appears well-developed and well-nourished.  Obese  No distress.  HEENT: head atraumatic, normocephalic, pupils equal and reactive to light, neck supple, throat within normal limits Cardiovascular: Normal rate, regular rhythm and normal heart sounds.  No murmur heard. No BLE edema. Pulmonary/Chest: Effort normal and breath sounds normal. No respiratory distress. Abdominal: Soft.  There is no tenderness. Psychiatric: Patient has a normal mood and affect. behavior is normal. Judgment and thought content normal.  Recent Results (from the past 2160 hour(s))  POCT HgB A1C     Status: Abnormal   Collection Time: 07/08/16  9:58 AM  Result Value Ref Range   Hemoglobin A1C 6.3      PHQ2/9: Depression screen [facility] 2/9 03/22/2016 12/14/2015 11/12/2015 01/20/2015 01/07/2015  Decreased Interest 0 0 0 0 0  Down, Depressed, Hopeless 0 0 0 0 0  PHQ - 2 Score 0 0 0 0 0     Fall Risk: Fall Risk  03/22/2016 12/14/2015 11/12/2015 01/20/2015 01/07/2015  Falls in the past year? Yes Yes Yes Yes No  Number falls in past yr: 1 2 or more 1 2 or more -  Injury with Fall? Yes Yes Yes No -  Risk Factor Category  - - High Fall Risk High Fall Risk -  Risk for fall due to : - - - Impaired balance/gait;History of fall(s) -  Follow up - - Falls evaluation completed Education provided -      Assessment & Plan  1. Type 2 diabetes mellitus with stage 3 chronic kidney disease, without long-term current use of insulin (HCC)  - losartan (COZAAR) 100 MG tablet; Take 1 tablet (100 mg total) by mouth daily.  Dispense: 90 tablet; Refill: 1 - metFORMIN (GLUCOPHAGE-XR) 750 MG 24 hr tablet; Take 1 tablet (750 mg total) by mouth daily with breakfast. New dose and formulation  Dispense: 90 tablet; Refill: 1 - glipiZIDE (GLUCOTROL XL) 2.5 MG 24 hr tablet; Take 1 tablet (2.5 mg total) by mouth daily with breakfast.  Dispense: 90 tablet; Refill: 1  2. 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 Obstructive apnea  Continue CPAP   4. Dyslipidemia  associated with type 2 diabetes mellitus ([facility]CC)  - omega-3 acid ethyl esters (LOVAZA) 1 g capsule; Take 1 capsule (1 g total) by mouth 2 (two) times daily.  Dispense: 180 capsule; Refill: 1 - atorvastatin (LIPITOR) 40 MG tablet; Take 1 tablet (40 mg total) by mouth daily.  Dispense: 90 tablet; Refill: 1  5. Benign essential HTN  - losartan (COZAAR) 100 MG tablet; Take 1 tablet (100 mg total) by mouth daily.  Dispense: 90 tablet; Refill: 1  6. Vitamin D deficiency  - Vitamin D, Ergocalciferol, (DRISDOL) 50000 units CAPS capsule; Take 1 capsule (50,000 Units total) by mouth every 7 (seven) days.  Dispense: 12 capsule; Refill: 0  7. Insomnia, persistent  - zolpidem (AMBIEN CR) 6.25 MG CR tablet; Take 1 tablet (6.25 mg total) by mouth at bedtime as needed. for sleep  Dispense: 90 tablet; Refill: 0 Discussed sleep hygiene  8. B12 deficiency  - cyanocobalamin ((VITAMIN B-12)) injection 1,000 mcg; Inject 1 mL (1,000 mcg total) into the muscle once. - Cyanocobalamin (B-12) 1000 MCG SUBL; Place 1 mcg under the tongue daily.  Dispense: 90 each; Refill: 1  9. Hypertriglyceridemia  - omega-3 acid ethyl esters (LOVAZA) 1 g capsule; Take 1 capsule (1 g total) by mouth 2 (two) times daily.  Dispense: 180 capsule; Refill: 1

## 2016-07-10 ENCOUNTER — Encounter: Payer: Self-pay | Admitting: Family Medicine

## 2016-07-11 ENCOUNTER — Telehealth: Payer: Self-pay | Admitting: Family Medicine

## 2016-07-11 NOTE — Telephone Encounter (Signed)
At last visit pt was prescribed zolpidem and it was sent to his mail order pharmacy he was told that the doctor would call in a short supply to [facility] [address]. Pt is completely out and is asking for you to send it. Thank you (H) [phone]  (C) [phone]

## 2016-07-11 NOTE — Telephone Encounter (Signed)
Pt told Melissa last week when he checked out that he had forgotten to mention to you during visit but he had a few pills left

## 2016-07-12 NOTE — Telephone Encounter (Signed)
Yes just called in a 7 day supply for patient to [facility].

## 2016-07-12 NOTE — Telephone Encounter (Signed)
Did we call it in?

## 2016-07-21 ENCOUNTER — Ambulatory Visit: Payer: 59 | Admitting: Family Medicine

## 2016-08-26 ENCOUNTER — Other Ambulatory Visit: Payer: Self-pay | Admitting: Family Medicine

## 2016-08-26 NOTE — Telephone Encounter (Signed)
Patient requesting refill of Gabapentin to [facility].

## 2016-10-06 ENCOUNTER — Other Ambulatory Visit: Payer: Self-pay

## 2016-10-06 DIAGNOSIS — G4700 Insomnia, unspecified: Secondary | ICD-10-CM

## 2016-10-06 NOTE — Telephone Encounter (Signed)
Patient requesting refill of Ambien to [facility]. 

## 2016-10-07 ENCOUNTER — Encounter: Payer: Self-pay | Admitting: Family Medicine

## 2016-10-07 ENCOUNTER — Telehealth: Payer: Self-pay

## 2016-10-07 ENCOUNTER — Ambulatory Visit (INDEPENDENT_AMBULATORY_CARE_PROVIDER_SITE_OTHER): Payer: 59 | Admitting: Family Medicine

## 2016-10-07 VITALS — BP 120/71 | HR 103 | Temp 97.6°F | Resp 17 | Ht 70.0 in | Wt 243.3 lb

## 2016-10-07 DIAGNOSIS — G4700 Insomnia, unspecified: Secondary | ICD-10-CM

## 2016-10-07 DIAGNOSIS — E1129 Type 2 diabetes mellitus with other diabetic kidney complication: Secondary | ICD-10-CM

## 2016-10-07 DIAGNOSIS — E1169 Type 2 diabetes mellitus with other specified complication: Secondary | ICD-10-CM

## 2016-10-07 DIAGNOSIS — I10 Essential (primary) hypertension: Secondary | ICD-10-CM

## 2016-10-07 DIAGNOSIS — M545 Low back pain, unspecified: Secondary | ICD-10-CM

## 2016-10-07 DIAGNOSIS — N521 Erectile dysfunction due to diseases classified elsewhere: Secondary | ICD-10-CM

## 2016-10-07 DIAGNOSIS — Z23 Encounter for immunization: Secondary | ICD-10-CM

## 2016-10-07 DIAGNOSIS — E559 Vitamin D deficiency, unspecified: Secondary | ICD-10-CM

## 2016-10-07 DIAGNOSIS — E785 Hyperlipidemia, unspecified: Secondary | ICD-10-CM | POA: Diagnosis not present

## 2016-10-07 DIAGNOSIS — E538 Deficiency of other specified B group vitamins: Secondary | ICD-10-CM | POA: Diagnosis not present

## 2016-10-07 DIAGNOSIS — E1140 Type 2 diabetes mellitus with diabetic neuropathy, unspecified: Secondary | ICD-10-CM | POA: Diagnosis not present

## 2016-10-07 DIAGNOSIS — N183 Chronic kidney disease, stage 3 unspecified: Secondary | ICD-10-CM

## 2016-10-07 DIAGNOSIS — G4733 Obstructive sleep apnea (adult) (pediatric): Secondary | ICD-10-CM | POA: Diagnosis not present

## 2016-10-07 LAB — POCT GLYCOSYLATED HEMOGLOBIN (HGB A1C): HEMOGLOBIN A1C: 6.8

## 2016-10-07 MED ORDER — ACETAMINOPHEN 500 MG PO TABS: 500.0000 mg | ORAL_TABLET | Freq: Four times a day (QID) | ORAL | 0 refills | Status: AC | PRN

## 2016-10-07 MED ORDER — ZOLPIDEM TARTRATE ER 6.25 MG PO TBCR: 6.2500 mg | EXTENDED_RELEASE_TABLET | Freq: Every evening | ORAL | 0 refills | Status: DC | PRN

## 2016-10-07 MED ORDER — VITAMIN D (ERGOCALCIFEROL) 1.25 MG (50000 UNIT) PO CAPS: 50000.0000 [IU] | ORAL_CAPSULE | ORAL | 0 refills | Status: DC

## 2016-10-07 NOTE — Telephone Encounter (Signed)
Patient called to ask if Dr. Carlynn PurlSowles would sent in a 10 day supply of Ambien to [facility] until he gets his 90 pills from [facility].

## 2016-10-07 NOTE — Progress Notes (Signed)
Name: Brent Carroll   MRN: [id number]    DOB: 1945/09/16   Date:10/07/2016       Progress Note  Subjective  Chief Complaint  Chief Complaint  Patient presents with  . Medication Refill    3 month F/U  . Diabetes  . Hypertension  . Hyperlipidemia  . Sleep Apnea  . Gastroesophageal Reflux  . Insomnia  . Obesity    HPI  HTN: he is taking medication as prescribed, bp is at goal, no chest pain, dizziness  or palpitation.   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  He denies polyphagia, polyuria or polydipsia. He does not check glucose on a regular basis at home. No episodes of hypoglycemia. He is on ARB Lovaza, Atorvastatin. He is not taking any medications for ED. Gabapentin helped with pain on both feet, he also has B12 deficiency and is getting it  replaced.  OSA: he states Dr. Thana AtesMorrisey ordered   his sleep study, he was unable to tolerate CPAP machine, discussed the increased risk of heart attacks and strokes.   GERD: he has occasional heartburn symptoms and takes Tums prn, no regurgitation.   Insomnia: he has been on Ambien for many years and does not want to stop medication, discussed risk of medications. He is on lower dose of Ambien CR,and is doing well at this time.  Obesity: he has multiple co-morbidities, he had teeth pulled 10/2015 and since new dentures 12/2015  he has difficulty eating, so is no longer having a lot of snacks, he has also stopped sodas Summer 2017, he  gained weight since last visit.  Patient Active Problem List   Diagnosis Date Noted  . Morbid obesity, unspecified obesity type (HCC) 10/07/2016  . B12 deficiency 07/08/2016  . Dyslipidemia associated with type 2 diabetes mellitus (HCC) 11/12/2015  . 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 Vitamin D deficiency 08/14/2015  . CN (constipation) 01/07/2015  . Hyperlipidemia 01/07/2015  . Chronic kidney disease (CKD), stage III (moderate) 08/25/2014  . Narrowing of intervertebral disc space 08/25/2014  . Type 2 diabetes mellitus with other diabetic kidney complication (HCC) 08/25/2014  . Failure of erection 08/25/2014  . Acid reflux 08/25/2014  . 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 Obesity, Class II, BMI 35-39.9 08/25/2014  . Obstructive apnea 08/25/2014  . Benign essential HTN 03/16/2009  . Insomnia, persistent 02/10/2009    Past Surgical History:  Procedure Laterality Date  . HERNIA REPAIR    . HERNIA REPAIR Bilateral    inguinal   . TONSILLECTOMY      Family History  Problem Relation Age of Onset  . Heart disease Father   . COPD Father     Social History   Social History  . Marital status: Married    Spouse name: N/A  . Number of children: N/A  . Years of education: N/A   Occupational History  . Not on file.   Social History Main  Topics  . Smoking status: Former Smoker    Quit date: 04/04/1986  . Smokeless tobacco: Never Used  . Alcohol use No  . Drug use: No  . Sexual activity: Not Currently    Partners: Female   Other Topics Concern  . Not on file   Social History Narrative  . No narrative on file     Current Outpatient Prescriptions:  .  atorvastatin (LIPITOR) 40 MG tablet, Take 1 tablet (40 mg total) by mouth daily., Disp: 90 tablet, Rfl: 1 .  Cholecalciferol (VITAMIN D) 2000 units CAPS, Take 1 capsule (2,000 Units total) by mouth daily., Disp: 30 capsule, Rfl:  .  Cyanocobalamin (B-12) 1000 MCG SUBL, Place 1  mcg under the tongue daily., Disp: 90 each, Rfl: 1 .  gabapentin (NEURONTIN) 300 MG capsule, TAKE 1 TO 2 CAPSULES BY  MOUTH AT BEDTIME, Disp: 180 capsule, Rfl: 1 .  glipiZIDE (GLUCOTROL XL) 2.5 MG 24 hr tablet, Take 1 tablet (2.5 mg total) by mouth daily with breakfast., Disp: 90 tablet, Rfl: 1 .  losartan (COZAAR) 100 MG tablet, Take 1 tablet (100 mg total) by mouth daily., Disp: 90 tablet, Rfl: 1 .  metFORMIN (GLUCOPHAGE-XR) 750 MG 24 hr tablet, Take 1 tablet (750 mg total) by mouth daily with breakfast. New dose and formulation, Disp: 90 tablet, Rfl: 1 .  omega-3 acid ethyl esters (LOVAZA) 1 g capsule, Take 1 capsule (1 g total) by mouth 2 (two) times daily., Disp: 180 capsule, Rfl: 1 .  Vitamin D, Ergocalciferol, (DRISDOL) 50000 units CAPS capsule, Take 1 capsule (50,000 Units total) by mouth every 7 (seven) days., Disp: 12 capsule, Rfl: 0 .  zolpidem (AMBIEN CR) 6.25 MG CR tablet, Take 1 tablet (6.25 mg total) by mouth at bedtime as needed. for sleep, Disp: 90 tablet, Rfl: 0 .  acetaminophen (TYLENOL) 500 MG tablet, Take 1 tablet (500 mg total) by mouth every 6 (six) hours as needed., Disp: 60 tablet, Rfl: 0  Allergies  Allergen Reactions  . Trazodone And Nefazodone   . Trazodone Anxiety     ROS  Constitutional: Negative for fever or weight change.  Respiratory: Negative for cough and shortness of breath.   Cardiovascular: Negative for chest pain or palpitations.  Gastrointestinal: Negative for abdominal pain, no bowel changes.  Musculoskeletal: Positive for gait problem or joint swelling.  Skin: Negative for rash.  Neurological: Negative for dizziness or headache.  No other specific complaints in a complete review of systems (except as listed in HPI above).  Objective  Vitals:   10/07/16 0933  BP: 120/71  Pulse: (!) 103  Resp: 17  Temp: 97.6 F (36.4 C)  TempSrc: Oral  SpO2: 96%  Weight: 243 lb 4.8 oz (110.4 kg)  Height: 5\' 10"  (1.778 m)    Body mass index is 34.91  kg/m.  Physical Exam  Constitutional: Patient appears well-developed and well-nourished. Obese No distress.  HEENT: head atraumatic, normocephalic, pupils equal and reactive to light,  neck supple, throat within normal limits Cardiovascular: Normal rate, regular rhythm and normal heart sounds.  No murmur heard. No BLE edema. Pulmonary/Chest: Effort normal and breath sounds normal. No respiratory distress. Abdominal: Soft.  There is no tenderness. Psychiatric: Patient has a normal mood and affect. behavior is normal. Judgment and thought content normal.  PHQ2/9: Depression screen [facility] 2/9 10/07/2016 03/22/2016 12/14/2015 11/12/2015 01/20/2015  Decreased Interest 0 0 0 0 0  Down, Depressed, Hopeless 0 0 0 0 0  PHQ - 2 Score 0 0  0 0 0     Fall Risk: Fall Risk  10/07/2016 03/22/2016 12/14/2015 11/12/2015 01/20/2015  Falls in the past year? No Yes Yes Yes Yes  Number falls in past yr: - 1 2 or more 1 2 or more  Comment - - - - 2-3 times in past year  Injury with Fall? - Yes Yes Yes No  Comment - Fractured Left knee  Fracture his left knee - -  Risk Factor Category  - - - High Fall Risk High Fall Risk  Risk for fall due to : - - - - Impaired balance/gait;History of fall(s)  Follow up - - - Falls evaluation completed Education provided    Functional Status Survey: Is the patient deaf or have difficulty hearing?: No Does the patient have difficulty seeing, even when wearing glasses/contacts?: Yes (glasses) Does the patient have difficulty concentrating, remembering, or making decisions?: No Does the patient have difficulty walking or climbing stairs?: No Does the patient have difficulty dressing or bathing?: No Does the patient have difficulty doing errands alone such as visiting a doctor's office or shopping?: No    Assessment & Plan  1. Dyslipidemia associated with type 2 diabetes mellitus (HCC)  - POCT HgB A1C - Lipid panel  2. Type 2 diabetes mellitus with other diabetic kidney  complication (HCC)  - POCT HgB A1C  3. Morbid obesity, unspecified obesity type (HCC)  Weight is stable  4. Obstructive apnea   5. Chronic kidney disease (CKD), stage III (moderate)  Avoid NSAID's  6. Benign essential HTN  - Comp. Metabolic Panel (12)  7. 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 Insomnia, persistent  - zolpidem (AMBIEN CR) 6.25 MG CR tablet; Take 1 tablet (6.25 mg total) by mouth at bedtime as needed. for sleep  Dispense: 90 tablet; Refill: 0  9. Vitamin D deficiency  - Vitamin D, Ergocalciferol, (DRISDOL) 50000 units CAPS capsule; Take 1 capsule (50,000 Units total) by mouth every 7 (seven) days.  Dispense: 12 capsule; Refill: 0 - VITAMIN D 25 Hydroxy (Vit-D Deficiency, Fractures)  10. Midline low back pain without sciatica, unspecified chronicity  - acetaminophen (TYLENOL) 500 MG tablet; Take 1 tablet (500 mg total) by mouth every 6 (six) hours as needed.  Dispense: 60 tablet; Refill: 0  11. B12 deficiency  - Vitamin B12

## 2016-10-07 NOTE — Telephone Encounter (Deleted)
Okay to call it in.

## 2016-10-07 NOTE — Telephone Encounter (Signed)
He told me insurance does not pay for Ambien CR, so he can take the rx I gave to him to the local pharmacy

## 2016-10-07 NOTE — Telephone Encounter (Signed)
Patient notified via voicemail on home phone.

## 2016-10-08 LAB — VITAMIN B12: Vitamin B-12: 489 pg/mL (ref 232–1245)

## 2016-10-08 LAB — COMP. METABOLIC PANEL (12)
A/G RATIO: 2 (ref 1.2–2.2)
AST: 22 IU/L (ref 0–40)
Albumin: 4.6 g/dL (ref 3.6–4.8)
Alkaline Phosphatase: 88 IU/L (ref 39–117)
BUN/Creatinine Ratio: 15 (ref 10–24)
BUN: 18 mg/dL (ref 8–27)
Bilirubin Total: 1.1 mg/dL (ref 0.0–1.2)
CALCIUM: 10 mg/dL (ref 8.6–10.2)
CHLORIDE: 105 mmol/L (ref 96–106)
Creatinine, Ser: 1.22 mg/dL (ref 0.76–1.27)
GFR calc Af Amer: 70 mL/min/{1.73_m2} (ref >59)
GFR calc non Af Amer: 61 mL/min/{1.73_m2} (ref >59)
GLOBULIN, TOTAL: 2.3 g/dL (ref 1.5–4.5)
Glucose: 142 mg/dL — ABNORMAL HIGH (ref 65–99)
Potassium: 4.5 mmol/L (ref 3.5–5.2)
Sodium: 144 mmol/L (ref 134–144)
Total Protein: 6.9 g/dL (ref 6.0–8.5)

## 2016-10-08 LAB — VITAMIN D 25 HYDROXY (VIT D DEFICIENCY, FRACTURES): VIT D 25 HYDROXY: 18.7 ng/mL — AB (ref 30.0–100.0)

## 2016-10-08 LAB — LIPID PANEL
CHOLESTEROL TOTAL: 128 mg/dL (ref 100–199)
Chol/HDL Ratio: 4.3 ratio (ref 0.0–5.0)
HDL: 30 mg/dL — AB (ref >39)
LDL Calculated: 58 mg/dL (ref 0–99)
TRIGLYCERIDES: 200 mg/dL — AB (ref 0–149)
VLDL Cholesterol Cal: 40 mg/dL (ref 5–40)

## 2016-10-09 ENCOUNTER — Other Ambulatory Visit: Payer: Self-pay | Admitting: Family Medicine

## 2016-10-11 ENCOUNTER — Telehealth: Payer: Self-pay | Admitting: Family Medicine

## 2016-10-11 NOTE — Telephone Encounter (Signed)
Patient was handed the prescription after his office visit since we were under the impression he was taking it to a local pharmacy due to him telling Dr. Carlynn PurlSowles Ambien was not covered under his insurance. Patient states it has not been covered for a while but would like prescription sent to [facility] and a short term called into local pharmacy. Told patient we would have to have prescription back to fax it into [facility] for him. Then I could ask Dr. Carlynn PurlSowles about calling in short term supply to last him until he received his supply from [facility]. He states he layed the prescription somewhere at his house and now can't find it.

## 2016-10-11 NOTE — Telephone Encounter (Signed)
Requesting return call. He has missed placed the zolpidem and is not able to find it. [phone]

## 2016-10-11 NOTE — Telephone Encounter (Signed)
I cannot give Ambien for the next 3 months ( controlled medication) . I can change to Seroquel or Trazodone

## 2016-10-12 ENCOUNTER — Other Ambulatory Visit: Payer: Self-pay | Admitting: Family Medicine

## 2016-10-12 MED ORDER — QUETIAPINE FUMARATE 25 MG PO TABS: 25.0000 mg | ORAL_TABLET | Freq: Every day | ORAL | 0 refills | Status: DC

## 2016-10-12 NOTE — Telephone Encounter (Signed)
Patient stated he would try either one. Could you please send in a supply of Trazodone or Seroquel to [facility]. Thanks.

## 2016-10-26 NOTE — Telephone Encounter (Signed)
I am not able to close the chart. Will you close it for me. Thank you

## 2016-11-08 ENCOUNTER — Other Ambulatory Visit: Payer: Self-pay | Admitting: Family Medicine

## 2016-11-08 MED ORDER — QUETIAPINE FUMARATE 25 MG PO TABS: 25.0000 mg | ORAL_TABLET | Freq: Every day | ORAL | 2 refills | Status: DC

## 2016-11-08 NOTE — Telephone Encounter (Signed)
Requesting refill on quetiapine 25mg . Please send to [facility] [address]. Pt has about 6 pills left. [phone]

## 2016-11-23 ENCOUNTER — Other Ambulatory Visit: Payer: Self-pay | Admitting: Family Medicine

## 2016-11-23 DIAGNOSIS — E785 Hyperlipidemia, unspecified: Secondary | ICD-10-CM

## 2016-11-23 DIAGNOSIS — E781 Pure hyperglyceridemia: Secondary | ICD-10-CM

## 2016-11-23 DIAGNOSIS — E1169 Type 2 diabetes mellitus with other specified complication: Secondary | ICD-10-CM

## 2016-11-23 NOTE — Telephone Encounter (Signed)
Patient requesting refill of Lovaza to [facility].

## 2016-12-20 ENCOUNTER — Other Ambulatory Visit: Payer: Self-pay | Admitting: Family Medicine

## 2016-12-20 DIAGNOSIS — E1122 Type 2 diabetes mellitus with diabetic chronic kidney disease: Secondary | ICD-10-CM

## 2016-12-20 DIAGNOSIS — N183 Chronic kidney disease, stage 3 (moderate): Secondary | ICD-10-CM

## 2016-12-20 DIAGNOSIS — E1169 Type 2 diabetes mellitus with other specified complication: Secondary | ICD-10-CM

## 2016-12-20 DIAGNOSIS — E785 Hyperlipidemia, unspecified: Principal | ICD-10-CM

## 2016-12-20 DIAGNOSIS — I10 Essential (primary) hypertension: Secondary | ICD-10-CM

## 2017-01-05 ENCOUNTER — Ambulatory Visit: Payer: 59 | Admitting: Family Medicine

## 2017-01-05 ENCOUNTER — Encounter: Payer: Self-pay | Admitting: Family Medicine

## 2017-01-05 VITALS — BP 126/74 | HR 94 | Temp 98.0°F | Resp 18 | Ht 70.0 in | Wt 242.8 lb

## 2017-01-05 DIAGNOSIS — E559 Vitamin D deficiency, unspecified: Secondary | ICD-10-CM | POA: Diagnosis not present

## 2017-01-05 DIAGNOSIS — E781 Pure hyperglyceridemia: Secondary | ICD-10-CM | POA: Diagnosis not present

## 2017-01-05 DIAGNOSIS — J069 Acute upper respiratory infection, unspecified: Secondary | ICD-10-CM

## 2017-01-05 DIAGNOSIS — N183 Chronic kidney disease, stage 3 (moderate): Secondary | ICD-10-CM | POA: Diagnosis not present

## 2017-01-05 DIAGNOSIS — E1142 Type 2 diabetes mellitus with diabetic polyneuropathy: Secondary | ICD-10-CM

## 2017-01-05 DIAGNOSIS — E1129 Type 2 diabetes mellitus with other diabetic kidney complication: Secondary | ICD-10-CM | POA: Diagnosis not present

## 2017-01-05 DIAGNOSIS — E1169 Type 2 diabetes mellitus with other specified complication: Secondary | ICD-10-CM | POA: Diagnosis not present

## 2017-01-05 DIAGNOSIS — E785 Hyperlipidemia, unspecified: Secondary | ICD-10-CM

## 2017-01-05 DIAGNOSIS — I10 Essential (primary) hypertension: Secondary | ICD-10-CM | POA: Diagnosis not present

## 2017-01-05 DIAGNOSIS — E538 Deficiency of other specified B group vitamins: Secondary | ICD-10-CM

## 2017-01-05 DIAGNOSIS — E1140 Type 2 diabetes mellitus with diabetic neuropathy, unspecified: Secondary | ICD-10-CM | POA: Insufficient documentation

## 2017-01-05 DIAGNOSIS — Z23 Encounter for immunization: Secondary | ICD-10-CM

## 2017-01-05 DIAGNOSIS — E1122 Type 2 diabetes mellitus with diabetic chronic kidney disease: Secondary | ICD-10-CM | POA: Diagnosis not present

## 2017-01-05 LAB — POCT UA - MICROALBUMIN: MICROALBUMIN (UR) POC: 20 mg/L

## 2017-01-05 LAB — POCT GLYCOSYLATED HEMOGLOBIN (HGB A1C): HEMOGLOBIN A1C: 6.2

## 2017-01-05 MED ORDER — METFORMIN HCL ER 750 MG PO TB24: 1500.0000 mg | ORAL_TABLET | Freq: Every day | ORAL | 1 refills | Status: DC

## 2017-01-05 MED ORDER — ATORVASTATIN CALCIUM 40 MG PO TABS: 40.0000 mg | ORAL_TABLET | Freq: Every day | ORAL | 1 refills | Status: DC

## 2017-01-05 MED ORDER — LOSARTAN POTASSIUM 100 MG PO TABS: 100.0000 mg | ORAL_TABLET | Freq: Every day | ORAL | 1 refills | Status: DC

## 2017-01-05 MED ORDER — BENZONATATE 100 MG PO CAPS: 100.0000 mg | ORAL_CAPSULE | Freq: Three times a day (TID) | ORAL | 0 refills | Status: DC | PRN

## 2017-01-05 MED ORDER — QUETIAPINE FUMARATE 25 MG PO TABS: 25.0000 mg | ORAL_TABLET | Freq: Every day | ORAL | 1 refills | Status: DC

## 2017-01-05 MED ORDER — VITAMIN D (ERGOCALCIFEROL) 1.25 MG (50000 UNIT) PO CAPS: 50000.0000 [IU] | ORAL_CAPSULE | ORAL | 1 refills | Status: DC

## 2017-01-05 NOTE — Progress Notes (Signed)
Name: Brent Carroll   MRN: [id number]    DOB: 1945-10-02   Date:01/05/2017       Progress Note  Subjective  Chief Complaint  Chief Complaint  Patient presents with  . Medication Refill    3 month F/U  . Diabetes  . Dyslipidemia  . Hyperlipidemia  . Gastroesophageal Reflux    Well controlled  . Sleep Apnea  . Insomnia  . Obesity    HPI  HTN: he is taking medication as prescribed, bp is at goal, no chest pain, dizziness or palpitation.   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  He had dentures placed Nov 2017 and is still adjusting to it. He denies polyphagia, polyuria or polydipsia.  He is on ARB Lovaza, Atorvastatin. He is not taking any medications for ED. Gabapentin helped with pain on both feet, he also has B12 deficiency and is getting it  replaced. We will increase dose of Metformin since we are stopping Glipizide  OSA: he states Dr. Thana Ates ordered  his sleep study, he was unable to tolerate CPAP machine, discussed the increased risk of heart attacks and strokes.   GERD: he has occasional heartburn symptoms and takes Tums prn, no regurgitation.   Insomnia: he is off Ambien CR, he is taking Seroquel and gabapentin and is sleeping from midnight and waking up around 8 am, no side effects of medication  Obesity: he has multiple co-morbidities, he had teeth pulled 10/2015 and since new dentures 12/2015 he has difficulty eating, so is no longer having a lot of snacks, he has also stopped sodas Summer 2017, weight is stable now   Patient Active  Problem List   Diagnosis Date Noted  . Diabetic neuropathy (HCC) 01/05/2017  . Morbid obesity, unspecified obesity type (HCC) 10/07/2016  . B12 deficiency 07/08/2016  . Dyslipidemia associated with type 2 diabetes mellitus (HCC) 11/12/2015  . 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 Vitamin D deficiency 08/14/2015  . CN (constipation) 01/07/2015  . Hyperlipidemia 01/07/2015  . Chronic kidney disease (CKD), stage III (moderate) (HCC) 08/25/2014  . Narrowing of intervertebral disc space 08/25/2014  . Type 2 diabetes mellitus with other diabetic kidney complication (HCC) 08/25/2014  . Failure of erection 08/25/2014  . Acid reflux 08/25/2014  . 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 Obesity, Class II, BMI 35-39.9 08/25/2014  . Obstructive apnea 08/25/2014  . Benign essential HTN 03/16/2009  . Insomnia, persistent 02/10/2009    Past Surgical History:  Procedure Laterality Date  . HERNIA REPAIR    . HERNIA REPAIR Bilateral    inguinal   . TONSILLECTOMY      Family History  Problem Relation Age of Onset  . Heart disease Father   . COPD Father     Social History   Socioeconomic History  . Marital status: Married    Spouse name: Not on file  . Number of children: Not on file  . Years of education: Not on file  . Highest education level: Not on file  Social  Needs  . Financial resource strain: Not on file  . Food insecurity - worry: Not on file  . Food insecurity - inability: Not on file  . Transportation needs - medical: Not on file  . Transportation needs - non-medical: Not on file  Occupational History  . Not on file  Tobacco Use  . Smoking status: Former Smoker    Last attempt to quit: 04/04/1986    Years since quitting: 30.7  . Smokeless tobacco: Never Used  Substance and Sexual Activity  . Alcohol use: No    Alcohol/week: 0.0 oz  . Drug use: No  . Sexual activity: Not Currently    Partners: Female  Other Topics Concern  . Not  on file  Social History Narrative  . Not on file     Current Outpatient Medications:  .  acetaminophen (TYLENOL) 500 MG tablet, Take 1 tablet (500 mg total) by mouth every 6 (six) hours as needed., Disp: 60 tablet, Rfl: 0 .  atorvastatin (LIPITOR) 40 MG tablet, Take 1 tablet (40 mg total) daily by mouth., Disp: 90 tablet, Rfl: 1 .  Cholecalciferol (VITAMIN D) 2000 units CAPS, Take 1 capsule (2,000 Units total) by mouth daily., Disp: 30 capsule, Rfl:  .  Cyanocobalamin (B-12) 1000 MCG SUBL, Place 1 mcg under the tongue daily., Disp: 90 each, Rfl: 1 .  gabapentin (NEURONTIN) 300 MG capsule, TAKE 1 TO 2 CAPSULES BY  MOUTH AT BEDTIME, Disp: 180 capsule, Rfl: 1 .  glipiZIDE (GLUCOTROL XL) 2.5 MG 24 hr tablet, Take 1 tablet (2.5 mg total) by mouth daily with breakfast., Disp: 90 tablet, Rfl: 1 .  losartan (COZAAR) 100 MG tablet, Take 1 tablet (100 mg total) daily by mouth., Disp: 90 tablet, Rfl: 1 .  metFORMIN (GLUCOPHAGE-XR) 750 MG 24 hr tablet, Take 2 tablets (1,500 mg total) daily with breakfast by mouth. New dose and formulation, Disp: 180 tablet, Rfl: 1 .  omega-3 acid ethyl esters (LOVAZA) 1 g capsule, TAKE 1 CAPSULE BY MOUTH TWO TIMES DAILY, Disp: 180 capsule, Rfl: 2 .  QUEtiapine (SEROQUEL) 25 MG tablet, Take 1 tablet (25 mg total) at bedtime by mouth., Disp: 90 tablet, Rfl: 1 .  Vitamin D, Ergocalciferol, (DRISDOL) 50000 units CAPS capsule, Take 1 capsule (50,000 Units total) every 7 (seven) days by mouth., Disp: 12 capsule, Rfl: 1 .  benzonatate (TESSALON) 100 MG capsule, Take 1-2 capsules (100-200 mg total) 3 (three) times daily as needed by mouth., Disp: 40 capsule, Rfl: 0  Allergies  Allergen Reactions  . Trazodone And Nefazodone   . Trazodone Anxiety     ROS  Constitutional: Negative for fever or weight change.  Respiratory: Negative for cough and shortness of breath.   Cardiovascular: Negative for chest pain or palpitations.  Gastrointestinal: Negative for abdominal pain, no  bowel changes.  Musculoskeletal: Negative for gait problem or joint swelling.  Skin: Negative for rash.  Neurological: Negative for dizziness ( seldom when sugar drops) but no  headache.  No other specific complaints in a complete review of systems (except as listed in HPI above).   Objective  Vitals:   01/05/17 0935  BP: 126/74  Pulse: 94  Resp: 18  Temp: 98 F (36.7 C)  TempSrc: Oral  SpO2: 96%  Weight: 242 lb 12.8 oz (110.1 kg)  Height: 5\' 10"  (1.778 m)    Body mass index is 34.84 kg/m.  Physical Exam  Constitutional: Patient appears well-developed and well-nourished. Obese No distress.  HEENT: head atraumatic, normocephalic, pupils equal  and reactive to light,  neck supple, throat within normal limits Cardiovascular: Normal rate, regular rhythm and normal heart sounds.  No murmur heard. No BLE edema. Pulmonary/Chest: Effort normal and breath sounds normal. No respiratory distress. Abdominal: Soft.  There is no tenderness. Psychiatric: Patient has a normal mood and affect. behavior is normal. Judgment and thought content normal.  Recent Results (from the past 2160 hour(s))  POCT HgB A1C     Status: None   Collection Time: 01/05/17  9:33 AM  Result Value Ref Range   Hemoglobin A1C 6.2   POCT UA - Microalbumin     Status: None   Collection Time: 01/05/17 10:38 AM  Result Value Ref Range   Microalbumin Ur, POC 20 mg/L   Creatinine, POC  mg/dL   Albumin/Creatinine Ratio, Urine, POC      Diabetic Foot Exam: Diabetic Foot Exam - Simple   Simple Foot Form Diabetic Foot exam was performed with the following findings:  Yes 01/05/2017 10:19 AM  Visual Inspection See comments:  Yes Sensation Testing Intact to touch and monofilament testing bilaterally:  Yes Pulse Check Posterior Tibialis and Dorsalis pulse intact bilaterally:  Yes Comments Thick toenails.      PHQ2/9: [facility] 2/9 01/05/2017 10/07/2016 03/22/2016 12/14/2015 11/12/2015  Decreased  Interest 0 0 0 0 0  Down, Depressed, Hopeless 0 0 0 0 0  PHQ - 2 Score 0 0 0 0 0     Fall Risk: Fall Risk  01/05/2017 10/07/2016 03/22/2016 12/14/2015 11/12/2015  Falls in the past year? No No Yes Yes Yes  Number falls in past yr: - - 1 2 or more 1  Comment - - - - -  Injury with Fall? - - Yes Yes Yes  Comment - - Fractured Left knee  Fracture his left knee -  Risk Factor Category  - - - - High Fall Risk  Risk for fall due to : - - - - -  Follow up - - - - Falls evaluation completed     Functional Status Survey: Is the patient deaf or have difficulty hearing?: No Does the patient have difficulty seeing, even when wearing glasses/contacts?: No Does the patient have difficulty concentrating, remembering, or making decisions?: No Does the patient have difficulty walking or climbing stairs?: No Does the patient have difficulty dressing or bathing?: No Does the patient have difficulty doing errands alone such as visiting a doctor's office or shopping?: No   Assessment & Plan  1. Type 2 diabetes mellitus with other diabetic kidney complication (HCC)  - POCT HgB A1C - POCT UA - Microalbumin  2. Need for immunization against influenza  - Flu vaccine HIGH DOSE PF (Fluzone High dose)  3. Benign essential HTN  - losartan (COZAAR) 100 MG tablet; Take 1 tablet (100 mg total) daily by mouth.  Dispense: 90 tablet; Refill: 1  4. Type 2 diabetes mellitus with stage 3 chronic kidney disease, without long-term current use of insulin (HCC)  - losartan (COZAAR) 100 MG tablet; Take 1 tablet (100 mg total) daily by mouth.  Dispense: 90 tablet; Refill: 1 - metFORMIN (GLUCOPHAGE-XR) 750 MG 24 hr tablet; Take 2 tablets (1,500 mg total) daily with breakfast by mouth. New dose and formulation  Dispense: 180 tablet; Refill: 1  5. Vitamin D deficiency  - Vitamin D, Ergocalciferol, (DRISDOL) 50000 units CAPS capsule; Take 1 capsule (50,000 Units total) every 7 (seven) days by mouth.  Dispense: 12  capsule; Refill: 1  6. Hypertriglyceridemia  Needs to increase Lovaza to 2 g twice daily   7. Dyslipidemia associated with type 2 diabetes mellitus (HCC)  - atorvastatin (LIPITOR) 40 MG tablet; Take 1 tablet (40 mg total) daily by mouth.  Dispense: 90 tablet; Refill: 1  8. B12 deficiency  Needs to take otc SL supplementation   9. Diabetic peripheral neuropathy associated with type 2 diabetes mellitus (HCC)  Taking Gabapentin at night because it makes him sleepy during the day   10. Need for vaccination for Strep pneumoniae  - Pneumococcal polysaccharide vaccine 23-valent greater than or equal to 2yo subcutaneous/IM  11. Viral upper respiratory tract infection  Doing better, he needs something for cough  - benzonatate (TESSALON) 100 MG capsule; Take 1-2 capsules (100-200 mg total) 3 (three) times daily as needed by mouth.  Dispense: 40 capsule; Refill: 0

## 2017-02-04 ENCOUNTER — Other Ambulatory Visit: Payer: Self-pay | Admitting: Family Medicine

## 2017-02-04 DIAGNOSIS — E1122 Type 2 diabetes mellitus with diabetic chronic kidney disease: Secondary | ICD-10-CM

## 2017-02-04 DIAGNOSIS — N183 Chronic kidney disease, stage 3 (moderate): Principal | ICD-10-CM

## 2017-04-29 ENCOUNTER — Other Ambulatory Visit: Payer: Self-pay | Admitting: Family Medicine

## 2017-05-04 ENCOUNTER — Ambulatory Visit: Payer: 59 | Admitting: Family Medicine

## 2017-05-31 ENCOUNTER — Ambulatory Visit: Payer: Self-pay | Admitting: Family Medicine

## 2017-06-10 ENCOUNTER — Other Ambulatory Visit: Payer: Self-pay | Admitting: Family Medicine

## 2017-06-12 ENCOUNTER — Other Ambulatory Visit: Payer: Self-pay | Admitting: Family Medicine

## 2017-06-12 DIAGNOSIS — N183 Chronic kidney disease, stage 3 unspecified: Secondary | ICD-10-CM

## 2017-06-12 DIAGNOSIS — E1122 Type 2 diabetes mellitus with diabetic chronic kidney disease: Secondary | ICD-10-CM

## 2017-06-26 ENCOUNTER — Ambulatory Visit: Payer: Managed Care, Other (non HMO) | Admitting: Family Medicine

## 2017-06-26 ENCOUNTER — Encounter: Payer: Self-pay | Admitting: Family Medicine

## 2017-06-26 VITALS — BP 128/78 | HR 92 | Temp 97.8°F | Resp 16 | Ht 70.0 in | Wt 243.5 lb

## 2017-06-26 DIAGNOSIS — E781 Pure hyperglyceridemia: Secondary | ICD-10-CM

## 2017-06-26 DIAGNOSIS — G4733 Obstructive sleep apnea (adult) (pediatric): Secondary | ICD-10-CM

## 2017-06-26 DIAGNOSIS — E1140 Type 2 diabetes mellitus with diabetic neuropathy, unspecified: Secondary | ICD-10-CM

## 2017-06-26 DIAGNOSIS — R2681 Unsteadiness on feet: Secondary | ICD-10-CM

## 2017-06-26 DIAGNOSIS — E1122 Type 2 diabetes mellitus with diabetic chronic kidney disease: Secondary | ICD-10-CM

## 2017-06-26 DIAGNOSIS — N521 Erectile dysfunction due to diseases classified elsewhere: Secondary | ICD-10-CM

## 2017-06-26 DIAGNOSIS — E559 Vitamin D deficiency, unspecified: Secondary | ICD-10-CM | POA: Diagnosis not present

## 2017-06-26 DIAGNOSIS — N183 Chronic kidney disease, stage 3 unspecified: Secondary | ICD-10-CM

## 2017-06-26 DIAGNOSIS — E1169 Type 2 diabetes mellitus with other specified complication: Secondary | ICD-10-CM | POA: Diagnosis not present

## 2017-06-26 DIAGNOSIS — I10 Essential (primary) hypertension: Secondary | ICD-10-CM | POA: Diagnosis not present

## 2017-06-26 DIAGNOSIS — E538 Deficiency of other specified B group vitamins: Secondary | ICD-10-CM | POA: Diagnosis not present

## 2017-06-26 DIAGNOSIS — E785 Hyperlipidemia, unspecified: Secondary | ICD-10-CM

## 2017-06-26 DIAGNOSIS — E1129 Type 2 diabetes mellitus with other diabetic kidney complication: Secondary | ICD-10-CM | POA: Diagnosis not present

## 2017-06-26 DIAGNOSIS — G4700 Insomnia, unspecified: Secondary | ICD-10-CM

## 2017-06-26 LAB — POCT GLYCOSYLATED HEMOGLOBIN (HGB A1C): HEMOGLOBIN A1C: 6.6

## 2017-06-26 MED ORDER — QUETIAPINE FUMARATE 25 MG PO TABS: 25.0000 mg | ORAL_TABLET | Freq: Every day | ORAL | 1 refills | Status: DC

## 2017-06-26 MED ORDER — LOSARTAN POTASSIUM 100 MG PO TABS: 100.0000 mg | ORAL_TABLET | Freq: Every day | ORAL | 1 refills | Status: DC

## 2017-06-26 MED ORDER — METFORMIN HCL ER 750 MG PO TB24: ORAL_TABLET | ORAL | 0 refills | Status: DC

## 2017-06-26 MED ORDER — ATORVASTATIN CALCIUM 40 MG PO TABS: 40.0000 mg | ORAL_TABLET | Freq: Every day | ORAL | 1 refills | Status: DC

## 2017-06-26 NOTE — Progress Notes (Signed)
Name: Brent Carroll   MRN: [id number]    DOB: 01/17/1946   Date:06/26/2017       Progress Note  Subjective  Chief Complaint  Chief Complaint  Patient presents with  . Medication Refill  . Diabetes    Does not check his sugar at home  . Gastroesophageal Reflux    Infrequent and will take Baking Soda if symptoms bother him  . Insomnia    Doing well, sleeping on average 6 to 8 hours nightly  . Obesity    Stable  . Dyslipidemia    HPI   HTN: he is taking medication as prescribed, bp is at goal, no chest pain, dizzinessor palpitation.   Hyperlipidemia: he is now on Lovaza ( but only taking one pill twice daily)  and Atorvastatin, no side effects, HDL and triglycerides improved, but still not at goal, discussed life style changes  DMII with CKI III and also dyslipidemia, ED, neuropathy: his hgbA1Cwas6.3%, it  Went up to 6.8%, and down to 6.2%, 6.6% , he occasionally feels dizzy but not checking glucose at home, advised to try taking Glipizide 2.5 mg only on Holidays or on Sundays when he eats out.He denies polyphagia, polyuria or polydipsia.  He is on ARB, Lovaza, Atorvastatin. He is not taking any medications for ED. Gabapentin helped with pain on both feet, he also has B12 deficiency and is getting itreplaced. He is still taking Glipizide prn only, but Metformin daily, no side effects of medication. HgbA1C is at goal   OSA: he states Dr. Brett Albino sleep study, he was unable to tolerate CPAP machine, discussed the increased risk of heart attacks and strokes. He is still not interested in using it  GERD: he has occasional heartburn symptoms and takes Tums prn, no regurgitation. Unchanged  Insomnia: he is off Ambien CR, he is taking Seroquel and gabapentin and is sleeping from midnight and waking up around 8 am, no side effects of medication. Unchanged   Obesity: he has multiple co-morbidities, he had teeth pulled 10/2015 and since new dentures 12/2015 he has  difficulty eating, so is no longer having a lot of snacks, he is drinking sodas again but no longer daily. Weight is stable now  Vertigo: started in 2014, had MRI ordered by ENT, symptoms are better but still has occasional lack of balance intermittently.    Patient Active Problem List   Diagnosis Date Noted  . Diabetic neuropathy (HCC) 01/05/2017  . Morbid obesity, unspecified obesity type (HCC) 10/07/2016  . B12 deficiency 07/08/2016  . Dyslipidemia associated with type 2 diabetes mellitus (HCC) 11/12/2015  . 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 Vitamin D deficiency 08/14/2015  . CN (constipation) 01/07/2015  . Hyperlipidemia 01/07/2015  . Chronic kidney disease (CKD), stage III (moderate) (HCC) 08/25/2014  . Narrowing of intervertebral disc space 08/25/2014  . Type 2 diabetes mellitus with other diabetic kidney complication (HCC) 08/25/2014  . Failure of erection 08/25/2014  . Acid reflux 08/25/2014  . 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 Obesity, Class II, BMI 35-39.9 08/25/2014  . Obstructive apnea 08/25/2014  . Benign essential HTN 03/16/2009  . Insomnia, persistent 02/10/2009    Past Surgical History:  Procedure Laterality Date  . HERNIA REPAIR    . HERNIA REPAIR Bilateral    inguinal   . TONSILLECTOMY      Family History  Problem Relation Age of Onset  . Heart disease Father   . COPD Father     Social History  Socioeconomic History  . Marital status: Married    Spouse name: Not on file  . Number of children: Not on file  . Years of education: Not on file  . Highest education level: Not on file  Occupational History  . Not on file  Social Needs  . Financial resource strain: Not on file  . Food insecurity:    Worry: Not on file    Inability: Not on file  . Transportation needs:    Medical: Not on file    Non-medical: Not on file  Tobacco Use  . Smoking status: Former Smoker    Last attempt to quit: 04/04/1986     Years since quitting: 31.2  . Smokeless tobacco: Never Used  Substance and Sexual Activity  . Alcohol use: No    Alcohol/week: 0.0 oz  . Drug use: No  . Sexual activity: Not Currently    Partners: Female  Lifestyle  . Physical activity:    Days per week: Not on file    Minutes per session: Not on file  . Stress: Not on file  Relationships  . Social connections:    Talks on phone: Not on file    Gets together: Not on file    Attends religious service: Not on file    Active member of club or organization: Not on file    Attends meetings of clubs or organizations: Not on file    Relationship status: Not on file  . Intimate partner violence:    Fear of current or ex partner: Not on file    Emotionally abused: Not on file    Physically abused: Not on file    Forced sexual activity: Not on file  Other Topics Concern  . Not on file  Social History Narrative  . Not on file     Current Outpatient Medications:  .  acetaminophen (TYLENOL) 500 MG tablet, Take 1 tablet (500 mg total) by mouth every 6 (six) hours as needed., Disp: 60 tablet, Rfl: 0 .  atorvastatin (LIPITOR) 40 MG tablet, Take 1 tablet (40 mg total) by mouth daily., Disp: 90 tablet, Rfl: 1 .  Cholecalciferol (VITAMIN D) 2000 units CAPS, Take 1 capsule (2,000 Units total) by mouth daily., Disp: 30 capsule, Rfl:  .  Cyanocobalamin (B-12) 1000 MCG SUBL, Place 1 mcg under the tongue daily., Disp: 90 each, Rfl: 1 .  gabapentin (NEURONTIN) 300 MG capsule, TAKE 1 TO 2 CAPSULES BY  MOUTH AT BEDTIME, Disp: 180 capsule, Rfl: 1 .  glipiZIDE (GLUCOTROL XL) 2.5 MG 24 hr tablet, TAKE 1 TABLET BY MOUTH  DAILY WITH BREAKFAST, Disp: 90 tablet, Rfl: 1 .  losartan (COZAAR) 100 MG tablet, Take 1 tablet (100 mg total) by mouth daily., Disp: 90 tablet, Rfl: 1 .  metFORMIN (GLUCOPHAGE-XR) 750 MG 24 hr tablet, TAKE 2 TABLETS BY MOUTH  DAILY WITH BREAKFAST, Disp: 180 tablet, Rfl: 0 .  omega-3 acid ethyl esters (LOVAZA) 1 g capsule, TAKE 1  CAPSULE BY MOUTH TWO TIMES DAILY, Disp: 180 capsule, Rfl: 2 .  QUEtiapine (SEROQUEL) 25 MG tablet, Take 1 tablet (25 mg total) by mouth at bedtime., Disp: 90 tablet, Rfl: 1  Allergies  Allergen Reactions  . Trazodone And Nefazodone   . Trazodone Anxiety     ROS  Constitutional: Negative for fever or weight change.  Respiratory: Negative for cough and shortness of breath.   Cardiovascular: Negative for chest pain or palpitations.  Gastrointestinal: Negative for abdominal pain, no bowel changes.  Musculoskeletal:  positive  for gait problem or joint swelling.  Skin: Negative for rash.  Neurological: positive  For intermittent  dizziness but no  headache.  No other specific complaints in a complete review of systems (except as listed in HPI above).  Objective  Vitals:   06/26/17 0955  BP: 128/78  Pulse: 92  Resp: 16  Temp: 97.8 F (36.6 C)  TempSrc: Oral  SpO2: 95%  Weight: 243 lb 8 oz (110.5 kg)  Height:  (1.778 m)    Body mass index is 34.94 kg/m.  Physical Exam  Constitutional: Patient appears well-developed and well-nourished. Obese No distress.  HEENT: head atraumatic, normocephalic, pupils equal and reactive to light, neck supple, throat within normal limits Cardiovascular: Normal rate, regular rhythm and normal heart sounds.  No murmur heard. Trace BLE edema. Pulmonary/Chest: Effort normal and breath sounds normal. No respiratory distress. Abdominal: Soft.  There is no tenderness. Psychiatric: Patient has a normal mood and affect. behavior is normal. Judgment and thought content normal. Muscular Skeletal: normal gait today, used arm to get out of chair. Limping because of previous injury of left knee   PHQ2/9: Depression screen [facility] 2/9 06/26/2017 01/05/2017 10/07/2016 03/22/2016 12/14/2015  Decreased Interest 0 0 0 0 0  Down, Depressed, Hopeless 1 0 0 0 0  PHQ - 2 Score 1 0 0 0 0  Altered sleeping 0 - - - -  Tired, decreased energy 1 - - - -  Change in  appetite 0 - - - -  Feeling bad or failure about yourself  0 - - - -  Trouble concentrating 0 - - - -  Moving slowly or fidgety/restless 0 - - - -  Suicidal thoughts 0 - - - -  PHQ-9 Score 2 - - - -  Difficult doing work/chores Not difficult at all - - - -     Fall Risk: Fall Risk  06/26/2017 01/05/2017 10/07/2016 03/22/2016 12/14/2015  Falls in the past year? No No No Yes Yes  Number falls in past yr: - - - 1 2 or more  Comment - - - - -  Injury with Fall? - - - Yes Yes  Comment - - - Fractured Left knee  Fracture his left knee  Risk Factor Category  - - - - -  Risk for fall due to : - - - - -  Follow up - - - - -     Functional Status Survey: Is the patient deaf or have difficulty hearing?: No Does the patient have difficulty seeing, even when wearing glasses/contacts?: Yes(prescription glasses) Does the patient have difficulty concentrating, remembering, or making decisions?: No Does the patient have difficulty walking or climbing stairs?: Yes(Broke his left knee-couple of years ago(2016) still has trouble climbing steps) Does the patient have difficulty dressing or bathing?: No Does the patient have difficulty doing errands alone such as visiting a doctor's office or shopping?: No   Assessment & Plan  1. Type 2 diabetes mellitus with other diabetic kidney complication (HCC)  - POCT HgB A1C 6.6%  2. Dyslipidemia associated with type 2 diabetes mellitus (HCC)  - atorvastatin (LIPITOR) 40 MG tablet; Take 1 tablet (40 mg total) by mouth daily.  Dispense: 90 tablet; Refill: 1  3. Benign essential HTN  - losartan (COZAAR) 100 MG tablet; Take 1 tablet (100 mg total) by mouth daily.  Dispense: 90 tablet; Refill: 1  4. Type 2 diabetes mellitus with stage 3 chronic kidney disease, without long-term current use of  insulin (HCC)  - losartan (COZAAR) 100 MG tablet; Take 1 tablet (100 mg total) by mouth daily.  Dispense: 90 tablet; Refill: 1 - metFORMIN (GLUCOPHAGE-XR) 750 MG 24 hr  tablet; TAKE 2 TABLETS BY MOUTH  DAILY WITH BREAKFAST  Dispense: 180 tablet; Refill: 0  5. Hypertriglyceridemia  Discussed life style modification again   6. Vitamin D deficiency  Continue supplementation   7. B12 deficiency  Continue supplementation   8. Morbid obesity, unspecified obesity type [facility])  Discussed with the patient the risk posed by an increased BMI. Discussed importance of portion control, calorie counting and at least 150 minutes of physical activity weekly. Avoid sweet beverages and drink more water. Eat at least 6 servings of fruit and vegetables daily   9. Chronic kidney disease (CKD), stage III (moderate) (HCC)  Recheck labs next visit   10. 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 Insomnia, persistent  - QUEtiapine (SEROQUEL) 25 MG tablet; Take 1 tablet (25 mg total) by mouth at bedtime.  Dispense: 90 tablet; Refill: 1  12. Obstructive apnea  Cannot tolerate CPAP   13. Gait instability  Doing better

## 2017-06-27 ENCOUNTER — Telehealth: Payer: Self-pay

## 2017-06-27 NOTE — Telephone Encounter (Signed)
Called pt to sched AWV w/ [facility]. LVM requesting returned call.  

## 2017-08-11 ENCOUNTER — Other Ambulatory Visit: Payer: Self-pay | Admitting: Family Medicine

## 2017-08-11 DIAGNOSIS — I10 Essential (primary) hypertension: Secondary | ICD-10-CM

## 2017-08-11 DIAGNOSIS — G4700 Insomnia, unspecified: Secondary | ICD-10-CM

## 2017-08-11 DIAGNOSIS — E1122 Type 2 diabetes mellitus with diabetic chronic kidney disease: Secondary | ICD-10-CM

## 2017-08-11 DIAGNOSIS — E785 Hyperlipidemia, unspecified: Secondary | ICD-10-CM

## 2017-08-11 DIAGNOSIS — E1169 Type 2 diabetes mellitus with other specified complication: Secondary | ICD-10-CM

## 2017-08-11 DIAGNOSIS — N183 Chronic kidney disease, stage 3 (moderate): Secondary | ICD-10-CM

## 2017-08-11 NOTE — Telephone Encounter (Signed)
Copied from CRM [phone]#[id number]. Topic: Quick Communication - Rx Refill/Question >> Aug 11, 2017 10:19 AM Oneal GroutSebastian, Jennifer S wrote: Medication: QUEtiapine (SEROQUEL) 25 MG tablet, atorvastatin (LIPITOR) 40 MG tablet , losartan (COZAAR) 100 MG tablet and metFORMIN (GLUCOPHAGE-XR) 750 MG 24 hr tablet 90 day supply  Has the patient contacted their pharmacy? Yes.   (Agent: If no, request that the patient contact the pharmacy for the refill.) (Agent: If yes, when and what did the pharmacy advise?)  Preferred Pharmacy (with phone number or street name): [facility] [address]  Agent: Please be advised that RX refills may take up to 3 business days. We ask that you follow-up with your pharmacy.  Requesting call once sent

## 2017-08-14 MED ORDER — QUETIAPINE FUMARATE 25 MG PO TABS: 25.0000 mg | ORAL_TABLET | Freq: Every day | ORAL | 0 refills | Status: DC

## 2017-08-14 MED ORDER — METFORMIN HCL ER 750 MG PO TB24: ORAL_TABLET | ORAL | 0 refills | Status: DC

## 2017-08-14 MED ORDER — LOSARTAN POTASSIUM 100 MG PO TABS: 100.0000 mg | ORAL_TABLET | Freq: Every day | ORAL | 0 refills | Status: DC

## 2017-08-14 MED ORDER — GLIPIZIDE ER 2.5 MG PO TB24: 2.5000 mg | ORAL_TABLET | Freq: Every day | ORAL | 0 refills | Status: DC

## 2017-08-14 NOTE — Telephone Encounter (Signed)
Refill request for diabetic medication:   Glipizide 2.5 mg Metformin 750 mg Losartan 100 mg Seroquel 25 mg  Last office visit pertaining to diabetes: 06/26/2017   Lab Results  Component Value Date   HGBA1C 6.6 06/26/2017     Follow-ups on file. 10/05/2017

## 2017-08-19 ENCOUNTER — Other Ambulatory Visit: Payer: Self-pay | Admitting: Family Medicine

## 2017-08-19 DIAGNOSIS — E1169 Type 2 diabetes mellitus with other specified complication: Secondary | ICD-10-CM

## 2017-08-19 DIAGNOSIS — E785 Hyperlipidemia, unspecified: Principal | ICD-10-CM

## 2017-09-12 ENCOUNTER — Encounter: Payer: Self-pay | Admitting: Family Medicine

## 2017-09-12 LAB — HM DIABETES EYE EXAM

## 2017-10-02 ENCOUNTER — Other Ambulatory Visit: Payer: Self-pay | Admitting: Family Medicine

## 2017-10-02 DIAGNOSIS — N183 Chronic kidney disease, stage 3 (moderate): Principal | ICD-10-CM

## 2017-10-02 DIAGNOSIS — E1122 Type 2 diabetes mellitus with diabetic chronic kidney disease: Secondary | ICD-10-CM

## 2017-10-04 ENCOUNTER — Other Ambulatory Visit: Payer: Self-pay

## 2017-10-04 DIAGNOSIS — N183 Chronic kidney disease, stage 3 (moderate): Principal | ICD-10-CM

## 2017-10-04 DIAGNOSIS — E1122 Type 2 diabetes mellitus with diabetic chronic kidney disease: Secondary | ICD-10-CM

## 2017-10-04 NOTE — Telephone Encounter (Signed)
Please see if patient has enough until follow up on 08/08

## 2017-10-04 NOTE — Telephone Encounter (Signed)
Request for diabetes medication. [facility]rol to [facility].   Last office visit pertaining to diabetes: 06/26/17   Lab Results  Component Value Date   HGBA1C 6.6 06/26/2017    Follow up on 10/05/17

## 2017-10-04 NOTE — Telephone Encounter (Signed)
Left voice message at 8:32 asking pt to return call. Need to know if he has enough of his medication to last until his appt on 10-05-17

## 2017-10-05 ENCOUNTER — Encounter: Payer: Self-pay | Admitting: Family Medicine

## 2017-10-05 ENCOUNTER — Ambulatory Visit: Payer: Managed Care, Other (non HMO) | Admitting: Family Medicine

## 2017-10-05 VITALS — BP 126/78 | HR 87 | Temp 97.6°F | Resp 14 | Ht 70.0 in | Wt 241.2 lb

## 2017-10-05 DIAGNOSIS — E1129 Type 2 diabetes mellitus with other diabetic kidney complication: Secondary | ICD-10-CM

## 2017-10-05 DIAGNOSIS — E559 Vitamin D deficiency, unspecified: Secondary | ICD-10-CM

## 2017-10-05 DIAGNOSIS — E1122 Type 2 diabetes mellitus with diabetic chronic kidney disease: Secondary | ICD-10-CM

## 2017-10-05 DIAGNOSIS — R2681 Unsteadiness on feet: Secondary | ICD-10-CM

## 2017-10-05 DIAGNOSIS — E538 Deficiency of other specified B group vitamins: Secondary | ICD-10-CM | POA: Diagnosis not present

## 2017-10-05 DIAGNOSIS — E785 Hyperlipidemia, unspecified: Secondary | ICD-10-CM

## 2017-10-05 DIAGNOSIS — K2100 Gastro-esophageal reflux disease with esophagitis, without bleeding: Secondary | ICD-10-CM

## 2017-10-05 DIAGNOSIS — G4700 Insomnia, unspecified: Secondary | ICD-10-CM

## 2017-10-05 DIAGNOSIS — E1169 Type 2 diabetes mellitus with other specified complication: Secondary | ICD-10-CM

## 2017-10-05 DIAGNOSIS — I10 Essential (primary) hypertension: Secondary | ICD-10-CM | POA: Diagnosis not present

## 2017-10-05 DIAGNOSIS — G4733 Obstructive sleep apnea (adult) (pediatric): Secondary | ICD-10-CM

## 2017-10-05 DIAGNOSIS — Z79899 Other long term (current) drug therapy: Secondary | ICD-10-CM

## 2017-10-05 DIAGNOSIS — N183 Chronic kidney disease, stage 3 unspecified: Secondary | ICD-10-CM

## 2017-10-05 LAB — POCT GLYCOSYLATED HEMOGLOBIN (HGB A1C): Hemoglobin A1C: 6.5 % — AB (ref 4.0–5.6)

## 2017-10-05 MED ORDER — GABAPENTIN 300 MG PO CAPS: ORAL_CAPSULE | ORAL | 1 refills | Status: DC

## 2017-10-05 MED ORDER — LOSARTAN POTASSIUM 100 MG PO TABS: 100.0000 mg | ORAL_TABLET | Freq: Every day | ORAL | 1 refills | Status: DC

## 2017-10-05 MED ORDER — ATORVASTATIN CALCIUM 40 MG PO TABS: 40.0000 mg | ORAL_TABLET | Freq: Every day | ORAL | 1 refills | Status: DC

## 2017-10-05 MED ORDER — QUETIAPINE FUMARATE 25 MG PO TABS: 25.0000 mg | ORAL_TABLET | Freq: Every day | ORAL | 0 refills | Status: DC

## 2017-10-05 NOTE — Telephone Encounter (Signed)
Tried calling pt again today before I realized that he had already arrived to his appt

## 2017-10-05 NOTE — Progress Notes (Signed)
Name: Brent Carroll   MRN: [id number]    DOB: 08/07/1945   Date:10/05/2017       Progress Note  Subjective  Chief Complaint  Chief Complaint  Patient presents with  . Diabetes  . Hypertension  . Hyperlipidemia  . Sleep Apnea  . Gastroesophageal Reflux  . Insomnia  . Obesity  . Chronic Kidney Disease  . Medication Refill  . Labs Only    HPI  HTN: he is taking medication as prescribed, bp is at goal, no chest pain, SOB or palpitation.   Hyperlipidemia: he is now on Lovaza( but only taking one pill twice daily)and Atorvastatin, no side effects, HDL and triglycerides improved, but still not at goal, discussed life style changes. We will recheck labs today   DMII with CKI III and also dyslipidemia, ED, neuropathy: his hgbA1Cwas6.3%,it Went up to 6.8%, and down to 6.2%, 6.6% , and today it is 6.4% he occasionally feels dizzy but not checking glucose at home, advised to try taking Glipizide 2.5 mg only on Holidays or on Sundays when he eats out, he is doing that now and is doing well , He is taking metformin as prescribed. hgbA1C is still at goal.He denies polyphagia, polyuria or polydipsia. He is on ARB, Lovaza, Atorvastatin. He is not taking any medications for ED. Gabapentin helped with pain on both feet, he also has B12 deficiency but is not taking supplements at this time, we will recheck level.   OSA: he states Dr. Brett Albino sleep study, he was unable to tolerate CPAP machine, discussed the increased risk of heart attacks and strokes. He is still not interested in using it. Unchanged   GERD: he has occasional heartburn symptoms and takes Tums prn, no regurgitation. Doing well at this time  Insomnia:he is off Ambien CR, he is taking Seroquel and gabapentin and is sleeping from midnight and waking up around 8 am, no side effects of medication. Doing well on medication and no side effects   Obesity: he has multiple co-morbidities, he had teeth pulled  10/2015 and since new dentures 12/2015 he had difficulty eating initially but back to normal.  .He lost 4 lbs since last visit, weight has been stable.   Vertigo: started in 2014, had MRI ordered by ENT, symptoms are better but still has occasional lack of balance intermittently. He states less than once a week, it resolves by itself, not associated with hearing loss or tinnitus  Patient Active Problem List   Diagnosis Date Noted  . Diabetic neuropathy (HCC) 01/05/2017  . Morbid obesity, unspecified obesity type (HCC) 10/07/2016  . B12 deficiency 07/08/2016  . Dyslipidemia associated with type 2 diabetes mellitus (HCC) 11/12/2015  . 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 Vitamin D deficiency 08/14/2015  . CN (constipation) 01/07/2015  . Hyperlipidemia 01/07/2015  . Chronic kidney disease (CKD), stage III (moderate) (HCC) 08/25/2014  . Narrowing of intervertebral disc space 08/25/2014  . Type 2 diabetes mellitus with other diabetic kidney complication (HCC) 08/25/2014  . Failure of erection 08/25/2014  . Acid reflux 08/25/2014  . 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 Obesity, Class II, BMI 35-39.9 08/25/2014  . Obstructive apnea 08/25/2014  . Benign essential HTN 03/16/2009  . Insomnia, persistent 02/10/2009    Past Surgical History:  Procedure Laterality Date  . HERNIA REPAIR    . HERNIA REPAIR Bilateral    inguinal   . TONSILLECTOMY      Family History  Problem Relation Age of Onset  .  Heart disease Father   . COPD Father     Social History   Socioeconomic History  . Marital status: Married    Spouse name: Clydie BraunKaren  . Number of children: 3  . Years of education: Not on file  . Highest education level: Some college, no degree  Occupational History  . Not on file  Social Needs  . Financial resource strain: Not hard at all  . Food insecurity:    Worry: Never true    Inability: Never true  . Transportation needs:    Medical: No     Non-medical: No  Tobacco Use  . Smoking status: Former Smoker    Last attempt to quit: 04/04/1986    Years since quitting: 31.5  . Smokeless tobacco: Never Used  Substance and Sexual Activity  . Alcohol use: No    Alcohol/week: 0.0 standard drinks  . Drug use: No  . Sexual activity: Not Currently    Partners: Female  Lifestyle  . Physical activity:    Days per week: 2 days    Minutes per session: 10 min  . Stress: Not at all  Relationships  . Social connections:    Talks on phone: More than three times a week    Gets together: More than three times a week    Attends religious service: More than 4 times per year    Active member of club or organization: No    Attends meetings of clubs or organizations: Never    Relationship status: Married  . Intimate partner violence:    Fear of current or ex partner: No    Emotionally abused: No    Physically abused: No    Forced sexual activity: No  Other Topics Concern  . Not on file  Social History Narrative  . Not on file     Current Outpatient Medications:  .  acetaminophen (TYLENOL) 500 MG tablet, Take 1 tablet (500 mg total) by mouth every 6 (six) hours as needed., Disp: 60 tablet, Rfl: 0 .  atorvastatin (LIPITOR) 40 MG tablet, Take 1 tablet (40 mg total) by mouth daily., Disp: 90 tablet, Rfl: 1 .  Cholecalciferol (VITAMIN D) 2000 units CAPS, Take 1 capsule (2,000 Units total) by mouth daily., Disp: 30 capsule, Rfl:  .  Cyanocobalamin (B-12) 1000 MCG SUBL, Place 1 mcg under the tongue daily., Disp: 90 each, Rfl: 1 .  gabapentin (NEURONTIN) 300 MG capsule, TAKE 1 TO 2 CAPSULES BY  MOUTH AT BEDTIME, Disp: 180 capsule, Rfl: 1 .  losartan (COZAAR) 100 MG tablet, Take 1 tablet (100 mg total) by mouth daily., Disp: 90 tablet, Rfl: 1 .  metFORMIN (GLUCOPHAGE-XR) 750 MG 24 hr tablet, TAKE 2 TABLETS BY MOUTH  DAILY WITH BREAKFAST, Disp: 180 tablet, Rfl: 0 .  omega-3 acid ethyl esters (LOVAZA) 1 g capsule, TAKE 1 CAPSULE BY MOUTH TWO  TIMES DAILY, Disp: 180 capsule, Rfl: 2 .  QUEtiapine (SEROQUEL) 25 MG tablet, Take 1 tablet (25 mg total) by mouth at bedtime., Disp: 90 tablet, Rfl: 0 .  glipiZIDE (GLUCOTROL XL) 2.5 MG 24 hr tablet, Take 1 tablet (2.5 mg total) by mouth daily with breakfast. (Patient not taking: Reported on 10/05/2017), Disp: 90 tablet, Rfl: 0  Allergies  Allergen Reactions  . Trazodone And Nefazodone   . Trazodone Anxiety     ROS  Constitutional: Negative for fever or weight change.  Respiratory: Negative for cough and shortness of breath.   Cardiovascular: Negative for chest pain or palpitations.  Gastrointestinal: Negative for abdominal pain, no bowel changes.  Musculoskeletal: positive  for intermittent gait problem but no  joint swelling.  Skin: Negative for rash.  Neurological: Positive for intermittent dizziness but no  headache.  No other specific complaints in a complete review of systems (except as listed in HPI above).   Objective  Vitals:   10/05/17 0947  BP: 126/78  Pulse: 87  Resp: 14  Temp: 97.6 F (36.4 C)  TempSrc: Oral  SpO2: 95%  Weight: 241 lb 3.2 oz (109.4 kg)  Height: 5\' 10"  (1.778 m)    Body mass index is 34.61 kg/m.  Physical Exam  Constitutional: Patient appears well-developed and well-nourished. Obese  No distress.  HEENT: head atraumatic, normocephalic, pupils equal and reactive to light, neck supple, throat within normal limits Cardiovascular: Normal rate, regular rhythm and normal heart sounds.  No murmur heard. 1 plus  BLE edema. Pulmonary/Chest: Effort normal and breath sounds normal. No respiratory distress. Abdominal: Soft.  There is no tenderness. Psychiatric: Patient has a normal mood and affect. behavior is normal. Judgment and thought content normal.  Recent Results (from the past 2160 hour(s))  HM DIABETES EYE EXAM     Status: None   Collection Time: 09/12/17 12:00 AM  Result Value Ref Range   HM Diabetic Eye Exam No Retinopathy No  Retinopathy    Comment: [facility]   POCT glycosylated hemoglobin (Hb A1C)     Status: Abnormal   Collection Time: 10/05/17  9:58 AM  Result Value Ref Range   Hemoglobin A1C 6.5 (A) 4.0 - 5.6 %   HbA1c POC (<> result, manual entry)     HbA1c, POC (prediabetic range)     HbA1c, POC (controlled diabetic range)       PHQ2/9: Depression screen [facility] 2/9 10/05/2017 06/26/2017 01/05/2017 10/07/2016 03/22/2016  Decreased Interest 0 0 0 0 0  Down, Depressed, Hopeless 0 1 0 0 0  PHQ - 2 Score 0 1 0 0 0  Altered sleeping 0 0 - - -  Tired, decreased energy 0 1 - - -  Change in appetite 0 0 - - -  Feeling bad or failure about yourself  0 0 - - -  Trouble concentrating 0 0 - - -  Moving slowly or fidgety/restless 0 0 - - -  Suicidal thoughts 0 0 - - -  PHQ-9 Score 0 2 - - -  Difficult doing work/chores Not difficult at all Not difficult at all - - -     Fall Risk: Fall Risk  10/05/2017 06/26/2017 01/05/2017 10/07/2016 03/22/2016  Falls in the past year? No No No No Yes  Number falls in past yr: - - - - 1  Comment - - - - -  Injury with Fall? - - - - Yes  Comment - - - - Fractured Left knee   Risk Factor Category  - - - - -  Risk for fall due to : - - - - -  Follow up - - - - -     Functional Status Survey: Is the patient deaf or have difficulty hearing?: No Does the patient have difficulty seeing, even when wearing glasses/contacts?: No Does the patient have difficulty concentrating, remembering, or making decisions?: No Does the patient have difficulty walking or climbing stairs?: No Does the patient have difficulty dressing or bathing?: No Does the patient have difficulty doing errands alone such as visiting a doctor's office or shopping?: No    Assessment &  Plan  1. Type 2 diabetes mellitus with other diabetic kidney complication (HCC)  - POCT glycosylated hemoglobin (Hb A1C)  2. Benign essential HTN  - losartan (COZAAR) 100 MG tablet; Take 1 tablet (100 mg total) by  mouth daily.  Dispense: 90 tablet; Refill: 1  3. Vitamin D deficiency  - VITAMIN D 25 Hydroxy (Vit-D Deficiency, Fractures)  4. B12 deficiency  - Vitamin B12  5. Chronic kidney disease (CKD), stage III (moderate) (HCC)  - CBC with Differential/Platelet - Comprehensive metabolic panel  6. Morbid obesity, unspecified obesity type [facility])  Discussed with the patient the risk posed by an increased BMI. Discussed importance of portion control, calorie counting and at least 150 minutes of physical activity weekly. Avoid sweet beverages and drink more water. Eat at least 6 servings of fruit and vegetables daily   7. Long-term use of high-risk medication   8. Gastroesophageal reflux disease with esophagitis  Doing well at this time  9. Obstructive apnea   10. Gait instability  Very seldom, no recent fall, discussed PT but we will hold off for now   11. Dyslipidemia associated with type 2 diabetes mellitus (HCC)  - Lipid panel - atorvastatin (LIPITOR) 40 MG tablet; Take 1 tablet (40 mg total) by mouth daily.  Dispense: 90 tablet; Refill: 1  12. Dyslipidemia  - Lipid panel  13. Type 2 diabetes mellitus with stage 3 chronic kidney disease, without long-term current use of insulin (HCC)  - losartan (COZAAR) 100 MG tablet; Take 1 tablet (100 mg total) by mouth daily.  Dispense: 90 tablet; Refill: 1  14. Insomnia, persistent  - QUEtiapine (SEROQUEL) 25 MG tablet; Take 1 tablet (25 mg total) by mouth at bedtime.  Dispense: 90 tablet; Refill: 0

## 2017-10-06 LAB — COMPREHENSIVE METABOLIC PANEL
ALBUMIN: 4.6 g/dL (ref 3.6–4.8)
ALT: 49 IU/L — ABNORMAL HIGH (ref 0–44)
AST: 37 IU/L (ref 0–40)
Albumin/Globulin Ratio: 1.8 (ref 1.2–2.2)
Alkaline Phosphatase: 81 IU/L (ref 39–117)
BUN/Creatinine Ratio: 11 (ref 10–24)
BUN: 12 mg/dL (ref 8–27)
Bilirubin Total: 1.1 mg/dL (ref 0.0–1.2)
CALCIUM: 9.6 mg/dL (ref 8.6–10.2)
CO2: 22 mmol/L (ref 20–29)
CREATININE: 1.09 mg/dL (ref 0.76–1.27)
Chloride: 105 mmol/L (ref 96–106)
GFR calc Af Amer: 80 mL/min/{1.73_m2} (ref >59)
GFR, EST NON AFRICAN AMERICAN: 69 mL/min/{1.73_m2} (ref >59)
GLOBULIN, TOTAL: 2.5 g/dL (ref 1.5–4.5)
Glucose: 136 mg/dL — ABNORMAL HIGH (ref 65–99)
Potassium: 4.8 mmol/L (ref 3.5–5.2)
SODIUM: 145 mmol/L — AB (ref 134–144)
Total Protein: 7.1 g/dL (ref 6.0–8.5)

## 2017-10-06 LAB — CBC WITH DIFFERENTIAL/PLATELET
Basophils Absolute: 0 10*3/uL (ref 0.0–0.2)
Basos: 0 %
EOS (ABSOLUTE): 0.2 10*3/uL (ref 0.0–0.4)
Eos: 3 %
HEMOGLOBIN: 13.5 g/dL (ref 13.0–17.7)
Hematocrit: 38.8 % (ref 37.5–51.0)
IMMATURE GRANULOCYTES: 0 %
Immature Grans (Abs): 0 10*3/uL (ref 0.0–0.1)
Lymphocytes Absolute: 1.6 10*3/uL (ref 0.7–3.1)
Lymphs: 23 %
MCH: 30.8 pg (ref 26.6–33.0)
MCHC: 34.8 g/dL (ref 31.5–35.7)
MCV: 88 fL (ref 79–97)
MONOCYTES: 7 %
Monocytes Absolute: 0.5 10*3/uL (ref 0.1–0.9)
NEUTROS PCT: 67 %
Neutrophils Absolute: 4.5 10*3/uL (ref 1.4–7.0)
Platelets: 210 10*3/uL (ref 150–450)
RBC: 4.39 x10E6/uL (ref 4.14–5.80)
RDW: 13.1 % (ref 12.3–15.4)
WBC: 6.8 10*3/uL (ref 3.4–10.8)

## 2017-10-06 LAB — LIPID PANEL
CHOL/HDL RATIO: 4 ratio (ref 0.0–5.0)
Cholesterol, Total: 116 mg/dL (ref 100–199)
HDL: 29 mg/dL — ABNORMAL LOW (ref >39)
LDL CALC: 50 mg/dL (ref 0–99)
Triglycerides: 187 mg/dL — ABNORMAL HIGH (ref 0–149)
VLDL CHOLESTEROL CAL: 37 mg/dL (ref 5–40)

## 2017-10-06 LAB — VITAMIN B12: Vitamin B-12: 422 pg/mL (ref 232–1245)

## 2017-10-06 LAB — VITAMIN D 25 HYDROXY (VIT D DEFICIENCY, FRACTURES): VIT D 25 HYDROXY: 21.2 ng/mL — AB (ref 30.0–100.0)

## 2017-10-31 ENCOUNTER — Other Ambulatory Visit: Payer: Self-pay | Admitting: Family Medicine

## 2017-10-31 DIAGNOSIS — N183 Chronic kidney disease, stage 3 (moderate): Principal | ICD-10-CM

## 2017-10-31 DIAGNOSIS — E1122 Type 2 diabetes mellitus with diabetic chronic kidney disease: Secondary | ICD-10-CM

## 2018-01-04 ENCOUNTER — Other Ambulatory Visit: Payer: Self-pay | Admitting: Family Medicine

## 2018-01-04 DIAGNOSIS — G4700 Insomnia, unspecified: Secondary | ICD-10-CM

## 2018-01-24 ENCOUNTER — Other Ambulatory Visit: Payer: Self-pay | Admitting: Family Medicine

## 2018-01-24 DIAGNOSIS — N183 Chronic kidney disease, stage 3 (moderate): Principal | ICD-10-CM

## 2018-01-24 DIAGNOSIS — E1122 Type 2 diabetes mellitus with diabetic chronic kidney disease: Secondary | ICD-10-CM

## 2018-02-02 ENCOUNTER — Ambulatory Visit: Payer: Managed Care, Other (non HMO) | Admitting: Family Medicine

## 2018-02-06 ENCOUNTER — Encounter: Payer: Self-pay | Admitting: Family Medicine

## 2018-02-06 ENCOUNTER — Ambulatory Visit: Payer: Managed Care, Other (non HMO) | Admitting: Family Medicine

## 2018-02-06 VITALS — BP 126/80 | HR 76 | Temp 97.7°F | Resp 16 | Ht 70.0 in | Wt 243.2 lb

## 2018-02-06 DIAGNOSIS — G4700 Insomnia, unspecified: Secondary | ICD-10-CM

## 2018-02-06 DIAGNOSIS — I10 Essential (primary) hypertension: Secondary | ICD-10-CM | POA: Diagnosis not present

## 2018-02-06 DIAGNOSIS — E781 Pure hyperglyceridemia: Secondary | ICD-10-CM

## 2018-02-06 DIAGNOSIS — N182 Chronic kidney disease, stage 2 (mild): Secondary | ICD-10-CM

## 2018-02-06 DIAGNOSIS — E785 Hyperlipidemia, unspecified: Secondary | ICD-10-CM | POA: Diagnosis not present

## 2018-02-06 DIAGNOSIS — K2100 Gastro-esophageal reflux disease with esophagitis, without bleeding: Secondary | ICD-10-CM

## 2018-02-06 DIAGNOSIS — Z23 Encounter for immunization: Secondary | ICD-10-CM | POA: Diagnosis not present

## 2018-02-06 DIAGNOSIS — I129 Hypertensive chronic kidney disease with stage 1 through stage 4 chronic kidney disease, or unspecified chronic kidney disease: Secondary | ICD-10-CM

## 2018-02-06 DIAGNOSIS — E1122 Type 2 diabetes mellitus with diabetic chronic kidney disease: Secondary | ICD-10-CM | POA: Diagnosis not present

## 2018-02-06 DIAGNOSIS — E1142 Type 2 diabetes mellitus with diabetic polyneuropathy: Secondary | ICD-10-CM

## 2018-02-06 DIAGNOSIS — E1169 Type 2 diabetes mellitus with other specified complication: Secondary | ICD-10-CM | POA: Diagnosis not present

## 2018-02-06 DIAGNOSIS — E538 Deficiency of other specified B group vitamins: Secondary | ICD-10-CM

## 2018-02-06 DIAGNOSIS — G4733 Obstructive sleep apnea (adult) (pediatric): Secondary | ICD-10-CM

## 2018-02-06 LAB — POCT GLYCOSYLATED HEMOGLOBIN (HGB A1C)
HBA1C, POC (PREDIABETIC RANGE): 6.6 % — AB (ref 5.7–6.4)
HbA1c POC (<> result, manual entry): 6.6 % (ref 4.0–5.6)
HbA1c, POC (controlled diabetic range): 6.6 % (ref 0.0–7.0)
Hemoglobin A1C: 6.6 % — AB (ref 4.0–5.6)

## 2018-02-06 MED ORDER — LOSARTAN POTASSIUM 100 MG PO TABS: 100.0000 mg | ORAL_TABLET | Freq: Every day | ORAL | 1 refills | Status: DC

## 2018-02-06 MED ORDER — GABAPENTIN 300 MG PO CAPS: ORAL_CAPSULE | ORAL | 1 refills | Status: DC

## 2018-02-06 MED ORDER — METFORMIN HCL ER 750 MG PO TB24: 1500.0000 mg | ORAL_TABLET | Freq: Every day | ORAL | 0 refills | Status: DC

## 2018-02-06 MED ORDER — QUETIAPINE FUMARATE 25 MG PO TABS: 25.0000 mg | ORAL_TABLET | Freq: Every day | ORAL | 0 refills | Status: DC

## 2018-02-06 MED ORDER — OMEGA-3-ACID ETHYL ESTERS 1 G PO CAPS: 1.0000 g | ORAL_CAPSULE | Freq: Two times a day (BID) | ORAL | 1 refills | Status: DC

## 2018-02-06 MED ORDER — ATORVASTATIN CALCIUM 40 MG PO TABS: 40.0000 mg | ORAL_TABLET | Freq: Every day | ORAL | 1 refills | Status: DC

## 2018-02-06 NOTE — Progress Notes (Signed)
Name: Brent Carroll   MRN: [id number]    DOB: Feb 17, 1946   Date:02/06/2018       Progress Note  Subjective  Chief Complaint  Chief Complaint  Patient presents with  . Diabetes  . Hypertension  . Hyperlipidemia    HPI    HTN: he is taking medication as prescribed, bp is at goal, no chest pain, SOB or palpitation. 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 He is now off glipizide and taking metformin as prescribed. hgbA1C is still at goal.He denies polyphagia, polyuria or polydipsia. He is on ARB,Lovaza, Atorvastatin. He is not taking any medications for ED. Gabapentin helped with pain on both feet, he also has B12 deficiency and is taking otc supplementation, he states neuropathy has improved.   OSA: he states Dr. Brett Albino sleep study, he was unable to tolerate CPAP machine, discussed the increased risk of heart attacks and strokes. He is still not interested in having a repeat study at this time  GERD: he has occasional heartburn symptoms and takes Tums prn, no regurgitation.Unchanged   Insomnia:he is off Ambien CR, he is taking Seroquel and gabapentin, he watches TV at night, he states that he stays asleep 7-8 hours and feels rested in am. no side effects of medication. Unchanged   Obesity: he has multiple co-morbidities, he has gained a few pounds since last visit, he has HTN, DM, OSA, GERD. Discussed importance of losing weight again   Vertigo: started in 2014, had MRI ordered by ENT, symptoms are better but still has occasional lack of balance intermittently.He states less than once a week, it resolves by itself, not associated with hearing loss or  tinnitus  Patient Active Problem List   Diagnosis Date Noted  . Diabetic neuropathy (HCC) 01/05/2017  . Morbid obesity, unspecified obesity type (HCC) 10/07/2016  . B12 deficiency 07/08/2016  . Dyslipidemia associated with type 2 diabetes mellitus (HCC) 11/12/2015  . 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 Vitamin D deficiency 08/14/2015  . CN (constipation) 01/07/2015  . Hyperlipidemia 01/07/2015  . Chronic kidney disease, stage II (mild) 08/25/2014  . Narrowing of intervertebral disc space 08/25/2014  . Type 2 diabetes mellitus with other diabetic kidney complication (HCC) 08/25/2014  . Failure of erection 08/25/2014  . Acid reflux 08/25/2014  . 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 Obstructive apnea 08/25/2014  . Benign essential HTN 03/16/2009  . Insomnia, persistent 02/10/2009    Past Surgical History:  Procedure Laterality Date  . HERNIA REPAIR    . HERNIA REPAIR Bilateral    inguinal   . TONSILLECTOMY      Family History  Problem Relation Age of Onset  . Heart disease Father   . COPD Father     Social History   Socioeconomic History  . Marital status: Married    Spouse name: Clydie Braun  . Number of children: 3  . Years of education: Not on file  . Highest education level: Some college, no degree  Occupational History  . Not on file  Social Needs  . Financial resource strain: Not hard at all  . Food insecurity:    Worry: Never true  Inability: Never true  . Transportation needs:    Medical: No    Non-medical: No  Tobacco Use  . Smoking status: Former Smoker    Last attempt to quit: 04/04/1986    Years since quitting: 31.8  . Smokeless tobacco: Never Used  Substance and Sexual Activity  . Alcohol use: No    Alcohol/week: 0.0 standard drinks  . Drug use: No  . Sexual activity: Not Currently    Partners: Female  Lifestyle  . Physical activity:    Days per week: 2 days    Minutes per session: 10 min  . Stress: Not  at all  Relationships  . Social connections:    Talks on phone: More than three times a week    Gets together: More than three times a week    Attends religious service: More than 4 times per year    Active member of club or organization: No    Attends meetings of clubs or organizations: Never    Relationship status: Married  . Intimate partner violence:    Fear of current or ex partner: No    Emotionally abused: No    Physically abused: No    Forced sexual activity: No  Other Topics Concern  . Not on file  Social History Narrative  . Not on file     Current Outpatient Medications:  .  acetaminophen (TYLENOL) 500 MG tablet, Take 1 tablet (500 mg total) by mouth every 6 (six) hours as needed., Disp: 60 tablet, Rfl: 0 .  atorvastatin (LIPITOR) 40 MG tablet, Take 1 tablet (40 mg total) by mouth daily., Disp: 90 tablet, Rfl: 1 .  Cholecalciferol (VITAMIN D) 2000 units CAPS, Take 1 capsule (2,000 Units total) by mouth daily., Disp: 30 capsule, Rfl:  .  Cyanocobalamin (B-12) 1000 MCG SUBL, Place 1 mcg under the tongue daily., Disp: 90 each, Rfl: 1 .  gabapentin (NEURONTIN) 300 MG capsule, TAKE 1 TO 2 CAPSULES BY  MOUTH AT BEDTIME, Disp: 180 capsule, Rfl: 1 .  losartan (COZAAR) 100 MG tablet, Take 1 tablet (100 mg total) by mouth daily., Disp: 90 tablet, Rfl: 1 .  metFORMIN (GLUCOPHAGE-XR) 750 MG 24 hr tablet, Take 2 tablets (1,500 mg total) by mouth daily with breakfast., Disp: 180 tablet, Rfl: 0 .  omega-3 acid ethyl esters (LOVAZA) 1 g capsule, Take 1 capsule (1 g total) by mouth 2 (two) times daily., Disp: 180 capsule, Rfl: 1 .  QUEtiapine (SEROQUEL) 25 MG tablet, Take 1 tablet (25 mg total) by mouth at bedtime., Disp: 90 tablet, Rfl: 0  Allergies  Allergen Reactions  . Trazodone And Nefazodone   . Trazodone Anxiety    I personally reviewed active problem list, medication list, allergies, family history, social history with the patient/caregiver today.   ROS  Constitutional:  Negative for fever or significant  weight change.  Respiratory: Negative for cough and shortness of breath.   Cardiovascular: Negative for chest pain or palpitations.  Gastrointestinal: Negative for abdominal pain, no bowel changes.  Musculoskeletal: Positive  for intermittent gait problem but no  joint swelling.  Skin: Negative for rash.  Neurological: Positive for intermittent  dizziness but no headache.  No other specific complaints in a complete review of systems (except as listed in HPI above).  Objective  Vitals:   02/06/18 1053  BP: 126/80  Pulse: 76  Resp: 16  Temp: 97.7 F (36.5 C)  TempSrc: Oral  SpO2: 96%  Weight: 243 lb 3.2 oz (110.3 kg)  Height: 5\' 10"  (1.778 m)    Body mass index is 34.9 kg/m.  Physical Exam  Constitutional: Patient appears well-developed and well-nourished. Obese  No distress.  HEENT: head atraumatic, normocephalic, pupils equal and reactive to light,  neck supple, throat within normal limits Cardiovascular: Normal rate, regular rhythm and normal heart sounds.  No murmur heard. No BLE edema. Pulmonary/Chest: Effort normal and breath sounds normal. No respiratory distress. Abdominal: Soft.  There is no tenderness. Psychiatric: Patient has a normal mood and affect. behavior is normal. Judgment and thought content normal.   Diabetic Foot Exam: Diabetic Foot Exam - Simple   Simple Foot Form Diabetic Foot exam was performed with the following findings:  Yes 02/06/2018 11:28 AM  Visual Inspection See comments:  Yes Sensation Testing See comments:  Yes Pulse Check Posterior Tibialis and Dorsalis pulse intact bilaterally:  Yes Comments Thick toe nails, decrease sensation monofilament test       PHQ2/9: [facility] screen [facility] 2/9 10/05/2017 06/26/2017 01/05/2017 10/07/2016 03/22/2016  Decreased Interest 0 0 0 0 0  Down, Depressed, Hopeless 0 1 0 0 0  PHQ - 2 Score 0 1 0 0 0  Altered sleeping 0 0 - - -  Tired, decreased energy 0 1 - - -   Change in appetite 0 0 - - -  Feeling bad or failure about yourself  0 0 - - -  Trouble concentrating 0 0 - - -  Moving slowly or fidgety/restless 0 0 - - -  Suicidal thoughts 0 0 - - -  PHQ-9 Score 0 2 - - -  Difficult doing work/chores Not difficult at all Not difficult at all - - -     Fall Risk: Fall Risk  10/05/2017 06/26/2017 01/05/2017 10/07/2016 03/22/2016  Falls in the past year? No No No No Yes  Number falls in past yr: - - - - 1  Comment - - - - -  Injury with Fall? - - - - Yes  Comment - - - - Fractured Left knee   Risk Factor Category  - - - - -  Risk for fall due to : - - - - -  Follow up - - - - -      Assessment & Plan  1. Dyslipidemia associated with type 2 diabetes mellitus (HCC)  - POCT HgB A1C - atorvastatin (LIPITOR) 40 MG tablet; Take 1 tablet (40 mg total) by mouth daily.  Dispense: 90 tablet; Refill: 1 - omega-3 acid ethyl esters (LOVAZA) 1 g capsule; Take 1 capsule (1 g total) by mouth 2 (two) times daily.  Dispense: 180 capsule; Refill: 1  2. Flu vaccine need  - Flu vaccine HIGH DOSE PF  3. Benign essential HTN  - losartan (COZAAR) 100 MG tablet; Take 1 tablet (100 mg total) by mouth daily.  Dispense: 90 tablet; Refill: 1  4. Type 2 diabetes mellitus with stage 2 chronic kidney disease, without long-term current use of insulin (HCC)  - losartan (COZAAR) 100 MG tablet; Take 1 tablet (100 mg total) by mouth daily.  Dispense: 90 tablet; Refill: 1 - metFORMIN (GLUCOPHAGE-XR) 750 MG 24 hr tablet; Take 2 tablets (1,500 mg total) by mouth daily with breakfast.  Dispense: 180 tablet; Refill: 0  5. Insomnia, persistent  - QUEtiapine (SEROQUEL) 25 MG tablet; Take 1 tablet (25 mg total) by mouth at bedtime.  Dispense: 90 tablet; Refill: 0  6. Chronic kidney disease (CKD), stage II (moderate) (HCC)  Last GFR   7. Diabetic  peripheral neuropathy associated with type 2 diabetes mellitus (HCC)  - gabapentin (NEURONTIN) 300 MG capsule; TAKE 1 TO 2 CAPSULES  BY  MOUTH AT BEDTIME  Dispense: 180 capsule; Refill: 1 - Urine Microalbumin w/creat. ratio  8. Gastroesophageal reflux disease with esophagitis  Doing well on prn medication  9. Obstructive apnea  Unable to tolerate CPAP   10. B12 deficiency  Continue supplementation   11. Hypertriglyceridemia  - omega-3 acid ethyl esters (LOVAZA) 1 g capsule; Take 1 capsule (1 g total) by mouth 2 (two) times daily.  Dispense: 180 capsule; Refill: 1

## 2018-03-12 ENCOUNTER — Other Ambulatory Visit: Payer: Self-pay | Admitting: Family Medicine

## 2018-03-12 DIAGNOSIS — G4700 Insomnia, unspecified: Secondary | ICD-10-CM

## 2018-03-12 NOTE — Telephone Encounter (Signed)
Left message for patient to call us back-wanted to inform Brent Carroll I fax his medication list as asked to [facility]. Also patient will need to call [facility] Customer Service at  [phone] to dis enroll in their program.

## 2018-03-12 NOTE — Telephone Encounter (Signed)
Pt is no longer using [facility]. New pharmacy is requesting med list for Pt before they will dispense any of his medications/ please send med list to pharmacy asap.   [facility] [place], [place] - [address] [phone] (Phone) [phone] (Fax)

## 2018-03-12 NOTE — Telephone Encounter (Signed)
Advised Pt of med list sent and to dis enroll from [facility]

## 2018-03-12 NOTE — Telephone Encounter (Signed)
Copied from CRM [id number]. Topic: Quick Communication - Rx Refill/Question >> Mar 12, 2018 12:41 PM Jilda Roche wrote: Medication: QUEtiapine (SEROQUEL) 25 MG tablet [[id number]  Has the patient contacted their pharmacy? Yes.   (Agent: If no, request that the patient contact the pharmacy for the refill.) (Agent: If yes, when and what did the pharmacy advise?) Changed pharmacy on 02/28/2018  Preferred Pharmacy (with phone number or street name): [facility], [place] - [address] [phone] (Phone) [phone] (Fax)    Agent: Please be advised that RX refills may take up to 3 business days. We ask that you follow-up with your pharmacy.

## 2018-03-22 NOTE — Telephone Encounter (Signed)
Pt states that the QUEtiapine (SEROQUEL) 25 MG tablet refill was never sent to  [facility], [place] - [address] [phone] (Phone) [phone] (Fax)    And he is needing the medication sent in.

## 2018-03-25 MED ORDER — QUETIAPINE FUMARATE 25 MG PO TABS: 25.0000 mg | ORAL_TABLET | Freq: Every day | ORAL | 0 refills | Status: DC

## 2018-06-06 ENCOUNTER — Ambulatory Visit (INDEPENDENT_AMBULATORY_CARE_PROVIDER_SITE_OTHER): Payer: Managed Care, Other (non HMO) | Admitting: Family Medicine

## 2018-06-06 ENCOUNTER — Other Ambulatory Visit: Payer: Self-pay

## 2018-06-06 ENCOUNTER — Encounter: Payer: Self-pay | Admitting: Family Medicine

## 2018-06-06 DIAGNOSIS — I10 Essential (primary) hypertension: Secondary | ICD-10-CM

## 2018-06-06 DIAGNOSIS — G4700 Insomnia, unspecified: Secondary | ICD-10-CM

## 2018-06-06 DIAGNOSIS — E785 Hyperlipidemia, unspecified: Secondary | ICD-10-CM

## 2018-06-06 DIAGNOSIS — E1122 Type 2 diabetes mellitus with diabetic chronic kidney disease: Secondary | ICD-10-CM

## 2018-06-06 DIAGNOSIS — E1169 Type 2 diabetes mellitus with other specified complication: Secondary | ICD-10-CM

## 2018-06-06 DIAGNOSIS — I129 Hypertensive chronic kidney disease with stage 1 through stage 4 chronic kidney disease, or unspecified chronic kidney disease: Secondary | ICD-10-CM

## 2018-06-06 DIAGNOSIS — N182 Chronic kidney disease, stage 2 (mild): Secondary | ICD-10-CM

## 2018-06-06 DIAGNOSIS — E781 Pure hyperglyceridemia: Secondary | ICD-10-CM

## 2018-06-06 DIAGNOSIS — E1142 Type 2 diabetes mellitus with diabetic polyneuropathy: Secondary | ICD-10-CM

## 2018-06-06 MED ORDER — METFORMIN HCL ER 750 MG PO TB24: 1500.0000 mg | ORAL_TABLET | Freq: Every day | ORAL | 1 refills | Status: DC

## 2018-06-06 MED ORDER — OMEGA-3-ACID ETHYL ESTERS 1 G PO CAPS: 1.0000 g | ORAL_CAPSULE | Freq: Two times a day (BID) | ORAL | 1 refills | Status: DC

## 2018-06-06 MED ORDER — GABAPENTIN 300 MG PO CAPS: 300.0000 mg | ORAL_CAPSULE | Freq: Two times a day (BID) | ORAL | 1 refills | Status: DC

## 2018-06-06 MED ORDER — ATORVASTATIN CALCIUM 40 MG PO TABS: 40.0000 mg | ORAL_TABLET | Freq: Every day | ORAL | 1 refills | Status: DC

## 2018-06-06 MED ORDER — LOSARTAN POTASSIUM 100 MG PO TABS: 100.0000 mg | ORAL_TABLET | Freq: Every day | ORAL | 1 refills | Status: DC

## 2018-06-06 MED ORDER — HYDROXYZINE HCL 25 MG PO TABS: 25.0000 mg | ORAL_TABLET | Freq: Every day | ORAL | 0 refills | Status: DC

## 2018-06-06 NOTE — Progress Notes (Signed)
Name: Brent Carroll   MRN: [id number]    DOB: 22-Sep-1945   Date:06/06/2018       Progress Note  Subjective  Chief Complaint  Chief Complaint  Patient presents with  . Diabetes  . Hyperlipidemia  . Hypertension  . Medication Refill    I connected with@ on 06/06/18 at 10:00 AM EDT by telephone and verified that I am speaking with the correct person using two identifiers.  I discussed the limitations, risks, security and privacy concerns of performing an evaluation and management service by telephone and the availability of in person appointments. Staff also discussed with the patient that there may be a patient responsible charge related to this service. 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 He is now off glipizide and taking metformin as prescribed. hgbA1C is still at goal.He denies polyphagia, polyuria or polydipsia. He is on ARB,Lovaza, Atorvastatin. He is not taking any medications for ED. Gabapentin helped with pain on both feet, he also has B12 deficiency and is taking otc supplementation, he states neuropathy has improved.   OSA: he states Dr. Brett Albino sleep study, he was unable to tolerate CPAP machine, discussed the increased risk of heart attacks and strokes. He is still not interested in having a repeat study at this time. Unchanged  GERD: he has occasional heartburn symptoms and takes Tums prn, no regurgitation.Unchanged   Insomnia:he is off Ambien CR, he is taking Seroquel and gabapentin, he watches TV at night until 11 pm,  discussed importance of sleep hygiene. He would like to try something else, we will try hydroxyzine   Obesity: he has multiple co-morbidities, he lost 3 lbs based on his home scale weight of 240 lbs this am.  He has HTN, DM, OSA, GERD.    Patient Active Problem List   Diagnosis Date Noted  . Diabetic neuropathy (HCC) 01/05/2017  . Morbid obesity, unspecified obesity type (HCC) 10/07/2016  . B12 deficiency 07/08/2016  . Dyslipidemia associated with type 2 diabetes mellitus (HCC) 11/12/2015  . 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 Vitamin D deficiency 08/14/2015  . CN (constipation) 01/07/2015  . Hyperlipidemia 01/07/2015  . Chronic kidney disease, stage II (mild) 08/25/2014  . Narrowing of intervertebral disc space 08/25/2014  . Type 2 diabetes mellitus with other diabetic kidney complication (HCC) 08/25/2014  . Failure of erection 08/25/2014  . Acid reflux 08/25/2014  . 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 Obstructive apnea 08/25/2014  . Benign essential HTN 03/16/2009  . Insomnia, persistent 02/10/2009    Past Surgical History:  Procedure Laterality Date  . HERNIA REPAIR    . HERNIA REPAIR Bilateral    inguinal   . TONSILLECTOMY      Family History  Problem Relation Age of Onset  . Heart disease Father   . COPD Father     Social History   Socioeconomic History  . Marital status: Married    Spouse name: Clydie Braun  . Number of children: 3  . Years of education: Not on file  . Highest education level: Some  college, no degree  Occupational History  . Not on file  Social Needs  . Financial resource strain: Not hard at all  . Food insecurity:    Worry: Never true    Inability: Never true  . Transportation needs:    Medical: No    Non-medical: No  Tobacco Use  . Smoking status: Former Smoker    Last attempt to quit: 04/04/1986    Years since quitting: 32.1  . Smokeless tobacco: Never Used  Substance and Sexual Activity  .  Alcohol use: No    Alcohol/week: 0.0 standard drinks  . Drug use: No  . Sexual activity: Not Currently    Partners: Female  Lifestyle  . Physical activity:    Days per week: 2 days    Minutes per session: 10 min  . Stress: Not at all  Relationships  . Social connections:    Talks on phone: More than three times a week    Gets together: More than three times a week    Attends religious service: More than 4 times per year    Active member of club or organization: No    Attends meetings of clubs or organizations: Never    Relationship status: Married  . Intimate partner violence:    Fear of current or ex partner: No    Emotionally abused: No    Physically abused: No    Forced sexual activity: No  Other Topics Concern  . Not on file  Social History Narrative  . Not on file     Current Outpatient Medications:  .  acetaminophen (TYLENOL) 500 MG tablet, Take 1 tablet (500 mg total) by mouth every 6 (six) hours as needed., Disp: 60 tablet, Rfl: 0 .  atorvastatin (LIPITOR) 40 MG tablet, Take 1 tablet (40 mg total) by mouth daily., Disp: 90 tablet, Rfl: 1 .  Cholecalciferol (VITAMIN D) 2000 units CAPS, Take 1 capsule (2,000 Units total) by mouth daily., Disp: 30 capsule, Rfl:  .  Cyanocobalamin (B-12) 1000 MCG SUBL, Place 1 mcg under the tongue daily., Disp: 90 each, Rfl: 1 .  gabapentin (NEURONTIN) 300 MG capsule, TAKE 1 TO 2 CAPSULES BY  MOUTH AT BEDTIME, Disp: 180 capsule, Rfl: 1 .  losartan (COZAAR) 100 MG tablet, Take 1 tablet (100 mg total) by mouth daily., Disp: 90 tablet, Rfl: 1 .  metFORMIN (GLUCOPHAGE-XR) 750 MG 24 hr tablet, Take 2 tablets (1,500 mg total) by mouth daily with breakfast., Disp: 180 tablet, Rfl: 0 .  QUEtiapine (SEROQUEL) 25 MG tablet, Take 1 tablet (25 mg total) by mouth at bedtime., Disp: 90 tablet, Rfl: 0 .  omega-3 acid ethyl esters (LOVAZA) 1 g capsule, Take 1 capsule (1 g total) by mouth 2 (two) times daily., Disp: 180 capsule, Rfl: 1  Allergies   Allergen Reactions  . Trazodone And Nefazodone   . Trazodone Anxiety    I personally reviewed active problem list, medication list, allergies, family history, social history with the patient/caregiver today.   ROS  Constitutional: Negative for fever or weight change.  Respiratory: Negative for cough and shortness of breath.   Cardiovascular: Negative for chest pain or palpitations.  Gastrointestinal: Negative for abdominal pain, no bowel changes.  Musculoskeletal: Negative for gait problem or joint swelling.  Skin: Negative for rash.  Neurological: positive  for intermittent dizziness but no  headache.  No other specific complaints in a complete review of systems (except as listed in HPI above).   Objective  Virtual encounter, vitals  not obtained.   Physical Exam   Telephone encounter, voice strong , engaging and answering questions appropriately   PHQ2/9: Depression screen [facility] 2/9 06/06/2018 10/05/2017 06/26/2017 01/05/2017 10/07/2016  Decreased Interest 0 0 0 0 0  Down, Depressed, Hopeless 0 0 1 0 0  PHQ - 2 Score 0 0 1 0 0  Altered sleeping 0 0 0 - -  Tired, decreased energy 0 0 1 - -  Change in appetite 0 0 0 - -  Feeling bad or failure about yourself  0 0 0 - -  Trouble concentrating 0 0 0 - -  Moving slowly or fidgety/restless 0 0 0 - -  Suicidal thoughts 0 0 0 - -  PHQ-9 Score 0 0 2 - -  Difficult doing work/chores - Not difficult at all Not difficult at all - -   PHQ-2/9 Result is negative.    Fall Risk: Fall Risk  06/06/2018 10/05/2017 06/26/2017 01/05/2017 10/07/2016  Falls in the past year? 0 No No No No  Number falls in past yr: 0 - - - -  Comment - - - - -  Injury with Fall? 0 - - - -  Comment - - - - -  Risk Factor Category  - - - - -  Risk for fall due to : - - - - -  Follow up - - - - -     Assessment & Plan  1. Dyslipidemia associated with type 2 diabetes mellitus (HCC)  - atorvastatin (LIPITOR) 40 MG tablet; Take 1 tablet (40 mg total) by mouth  daily.  Dispense: 90 tablet; Refill: 1 - omega-3 acid ethyl esters (LOVAZA) 1 g capsule; Take 1 capsule (1 g total) by mouth 2 (two) times daily.  Dispense: 180 capsule; Refill: 1  2. Diabetic peripheral neuropathy associated with type 2 diabetes mellitus (HCC)  - gabapentin (NEURONTIN) 300 MG capsule; TAKE 1 TO 2 CAPSULES BY  MOUTH AT BEDTIME  Dispense: 180 capsule; Refill: 1  3. Benign essential HTN  - losartan (COZAAR) 100 MG tablet; Take 1 tablet (100 mg total) by mouth daily.  Dispense: 90 tablet; Refill: 1  4. Type 2 diabetes mellitus with stage 2 chronic kidney disease, without long-term current use of insulin (HCC)  - losartan (COZAAR) 100 MG tablet; Take 1 tablet (100 mg total) by mouth daily.  Dispense: 90 tablet; Refill: 1 - metFORMIN (GLUCOPHAGE-XR) 750 MG 24 hr tablet; Take 2 tablets (1,500 mg total) by mouth daily with breakfast.  Dispense: 180 tablet; Refill: 1  5. Insomnia, persistent  - QUEtiapine (SEROQUEL) 25 MG tablet; Take 1 tablet (25 mg total) by mouth at bedtime.  Dispense: 90 tablet; Refill: 1  6. Hypertriglyceridemia  - omega-3 acid ethyl esters (LOVAZA) 1 g capsule; Take 1 capsule (1 g total) by mouth 2 (two) times daily.  Dispense: 180 capsule; Refill: 1   I discussed the assessment and treatment plan with the patient. The patient was provided an opportunity to ask questions and all were answered. The patient agreed with the plan and demonstrated an understanding of the instructions.   The patient was advised to call back or seek an in-person evaluation if the symptoms worsen or if the condition fails to improve as anticipated.  I provided 21  minutes of non-face-to-face time during this encounter.  Ruel Favors, MD

## 2018-06-15 ENCOUNTER — Telehealth: Payer: Self-pay | Admitting: Family Medicine

## 2018-06-15 NOTE — Telephone Encounter (Signed)
Losartan 100mg  is on backorder, the pharmacy only has Losartan in 25mg  and 50mg . Please call [phone] with response

## 2018-06-18 NOTE — Telephone Encounter (Signed)
Brent Carroll with [facility] calling to check on the patient's prescription for Losartan. States that the 100mg  is on back order. Pease advise.

## 2018-06-18 NOTE — Telephone Encounter (Signed)
Pt called back. Pt states the omega 3 tablets would cost him a $400+ co pay. Pt is asking if taking them over the counter would have the same affect.

## 2018-06-18 NOTE — Telephone Encounter (Signed)
Spoke with Dahlia Client with [facility] and informed her per Dr. Carlynn Purl to fill Losartan with the 50 mg bid to equal the 100 mg he was originially on but due to backorder not able to get.

## 2018-08-06 ENCOUNTER — Other Ambulatory Visit: Payer: Self-pay | Admitting: Family Medicine

## 2018-08-06 DIAGNOSIS — G4700 Insomnia, unspecified: Secondary | ICD-10-CM

## 2018-09-03 ENCOUNTER — Other Ambulatory Visit: Payer: Self-pay | Admitting: Family Medicine

## 2018-09-03 DIAGNOSIS — G4700 Insomnia, unspecified: Secondary | ICD-10-CM

## 2018-09-05 ENCOUNTER — Telehealth: Payer: Self-pay

## 2018-09-05 NOTE — Telephone Encounter (Signed)
Copied from [place] [id number]. Topic: General - Inquiry >> Sep 05, 2018 12:48 PM Mathis Bud wrote: Reason for CRM: Jarrett Soho called from [facility] stating  metFORMIN is on back order. [facility] call back #  [phone]

## 2018-09-06 NOTE — Telephone Encounter (Signed)
Called but no voicemail is set up.    Reason for CRM: Brent Carroll called from [facility] stating  metFORMIN is on back order. [facility] call back #  [phone]

## 2018-09-07 NOTE — Telephone Encounter (Signed)
Pharmacist states she does have the other Metformins-Instant Release and ER 500 and 1000 mg.  Jarrett Soho form [facility] states Dr. Ancil Boozer could order Metformin 500 mg take 1 and half to equal his current dosage. Please advise.

## 2018-09-07 NOTE — Telephone Encounter (Signed)
Spoke with Jarrett Soho and called in Metformin 500 mg 1.5 tablets twice a day to equal his old prescription 750 mg bid per Dr. Ancil Boozer.

## 2018-09-17 ENCOUNTER — Encounter: Payer: Self-pay | Admitting: Family Medicine

## 2018-09-17 ENCOUNTER — Other Ambulatory Visit: Payer: Self-pay

## 2018-09-17 ENCOUNTER — Ambulatory Visit (INDEPENDENT_AMBULATORY_CARE_PROVIDER_SITE_OTHER): Payer: Self-pay | Admitting: Family Medicine

## 2018-09-17 VITALS — Ht 70.0 in | Wt 243.0 lb

## 2018-09-17 DIAGNOSIS — E785 Hyperlipidemia, unspecified: Secondary | ICD-10-CM

## 2018-09-17 DIAGNOSIS — I10 Essential (primary) hypertension: Secondary | ICD-10-CM

## 2018-09-17 DIAGNOSIS — N182 Chronic kidney disease, stage 2 (mild): Secondary | ICD-10-CM

## 2018-09-17 DIAGNOSIS — I129 Hypertensive chronic kidney disease with stage 1 through stage 4 chronic kidney disease, or unspecified chronic kidney disease: Secondary | ICD-10-CM

## 2018-09-17 DIAGNOSIS — G4733 Obstructive sleep apnea (adult) (pediatric): Secondary | ICD-10-CM

## 2018-09-17 DIAGNOSIS — E538 Deficiency of other specified B group vitamins: Secondary | ICD-10-CM

## 2018-09-17 DIAGNOSIS — E1169 Type 2 diabetes mellitus with other specified complication: Secondary | ICD-10-CM

## 2018-09-17 DIAGNOSIS — G4700 Insomnia, unspecified: Secondary | ICD-10-CM

## 2018-09-17 DIAGNOSIS — E559 Vitamin D deficiency, unspecified: Secondary | ICD-10-CM

## 2018-09-17 MED ORDER — METFORMIN HCL 1000 MG PO TABS: 1000.0000 mg | ORAL_TABLET | Freq: Two times a day (BID) | ORAL | 0 refills | Status: DC

## 2018-09-17 NOTE — Progress Notes (Signed)
Name: Brent Carroll   MRN: [id number]    DOB: 1945/08/21   Date:09/17/2018       Progress Note  Subjective  Chief Complaint  Chief Complaint  Patient presents with  . Medication Refill  . Diabetes    Eye Exam September 21  . Hypertension    Edema in ankles since he was diagnosed wtih DM  . Hyperlipidemia    Lovaza is too expensive for patient to afford  . Sleep Apnea    Unchanged- takes sleeping pills to help  . Gastroesophageal Reflux    Has been well controlled   . Obesity    I connected with  Emmanual L Dreyfuss  on 09/17/18 at  2:40 PM EDT by a telephone  application and verified that I am speaking with the correct person using two identifiers.  I discussed the limitations of evaluation and management by telemedicine and the availability of in person appointments. The patient expressed understanding and agreed to proceed. Staff also discussed with the patient that there may be a patient responsible charge related to this service. Patient Location: at home  Provider Location: [facility]   HPI  HTN: he is taking medication as prescribed, bp usually at goal but has not checked in months. No chest pain or palpitation   Hyperlipidemia: he is off Lovaza because only have medicare and medication is too expensive, used to be under wife's insurance. 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 He is now off glipizide and takingmetformin as prescribed, on lower dose of metformin due to Metformin ER was recalled. He denies polyphagia, polyuria or polydipsia. He is on ARB, off Lovaza , Atorvastatin. He is not taking any medications for ED. Gabapentin helped with pain on both feet, he also has B12 deficiency and is taking otc supplementation, he states neuropathy has improved.  OSA: he states Dr. Nolberto Hanlon sleep study, he was unable to tolerate CPAP machine, discussed the increased risk  of heart attacks and strokes. He is still not interested in having a repeat study at this time. Unchanged  GERD: he has occasional heartburn symptoms and takes Tums prn, no regurgitation.Unchanged   Insomnia:he is off Ambien CR, he is taking Seroquel and gabapentin, he watches TV at night until 11 pm, discussed importance of sleep hygiene. He states only has difficulty falling asleep but staying asleep for about 8 hours. Continue medication   Obesity: he has multiple co-morbidities,weight has been stable , discussed importance of weight los  B12 and Vitamin D deficiency: we will recheck labs, not taking supplementation   Patient Active Problem List   Diagnosis Date Noted  . Diabetic neuropathy ([place]) 01/05/2017  . Morbid obesity, unspecified obesity type ([place]) 10/07/2016  . B12 deficiency 07/08/2016  . Dyslipidemia associated with type 2 diabetes mellitus ([place]) 11/12/2015  . 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 Vitamin D deficiency 08/14/2015  . CN (constipation) 01/07/2015  . Hyperlipidemia 01/07/2015  . Chronic kidney disease, stage II (mild) 08/25/2014  . Narrowing of intervertebral disc space 08/25/2014  . Type 2 diabetes mellitus with other diabetic kidney complication ([place]) 29/79/8921  . Failure of erection 08/25/2014  . Acid reflux 08/25/2014  . 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 Obstructive apnea 08/25/2014  . Benign essential HTN 03/16/2009  . Insomnia, persistent 02/10/2009    Past Surgical History:  Procedure Laterality Date  . HERNIA REPAIR    .  HERNIA REPAIR Bilateral    inguinal   . TONSILLECTOMY      Family History  Problem Relation Age of Onset  . Heart disease Father   . COPD Father     Social History   Socioeconomic History  . Marital status: Married    Spouse name: Clydie BraunKaren  . Number of children: 3  . Years of education: Not on file  . Highest education level: Some college, no degree  Occupational  History  . Not on file  Social Needs  . Financial resource strain: Not hard at all  . Food insecurity    Worry: Never true    Inability: Never true  . Transportation needs    Medical: No    Non-medical: No  Tobacco Use  . Smoking status: Former Smoker    Quit date: 04/04/1986    Years since quitting: 32.4  . Smokeless tobacco: Never Used  Substance and Sexual Activity  . Alcohol use: No    Alcohol/week: 0.0 standard drinks  . Drug use: No  . Sexual activity: Not Currently    Partners: Female  Lifestyle  . Physical activity    Days per week: 2 days    Minutes per session: 10 min  . Stress: Not at all  Relationships  . Social connections    Talks on phone: More than three times a week    Gets together: More than three times a week    Attends religious service: More than 4 times per year    Active member of club or organization: No    Attends meetings of clubs or organizations: Never    Relationship status: Married  . Intimate partner violence    Fear of current or ex partner: No    Emotionally abused: No    Physically abused: No    Forced sexual activity: No  Other Topics Concern  . Not on file  Social History Narrative  . Not on file     Current Outpatient Medications:  .  atorvastatin (LIPITOR) 40 MG tablet, Take 1 tablet (40 mg total) by mouth daily., Disp: 90 tablet, Rfl: 1 .  gabapentin (NEURONTIN) 300 MG capsule, Take 1 capsule (300 mg total) by mouth 2 (two) times daily. TAKE 1 TO 2 CAPSULES BY  MOUTH AT BEDTIME (Patient taking differently: Take 600 mg by mouth at bedtime. TAKE 1 TO 2 CAPSULES BY  MOUTH AT BEDTIME), Disp: 180 capsule, Rfl: 1 .  hydrOXYzine (ATARAX/VISTARIL) 25 MG tablet, Take 1 tablet (25 mg total) by mouth at bedtime., Disp: 90 tablet, Rfl: 0 .  losartan (COZAAR) 100 MG tablet, Take 1 tablet (100 mg total) by mouth daily., Disp: 90 tablet, Rfl: 1 .  metFORMIN (GLUCOPHAGE) 500 MG tablet, Take 750 mg by mouth 2 (two) times daily with a meal. ,  Disp: , Rfl:  .  QUEtiapine (SEROQUEL) 25 MG tablet, Take 1 tablet (25 mg total) by mouth at bedtime., Disp: 90 tablet, Rfl: 0 .  acetaminophen (TYLENOL) 500 MG tablet, Take 1 tablet (500 mg total) by mouth every 6 (six) hours as needed. (Patient not taking: Reported on 09/17/2018), Disp: 60 tablet, Rfl: 0 .  Cholecalciferol (VITAMIN D) 2000 units CAPS, Take 1 capsule (2,000 Units total) by mouth daily. (Patient not taking: Reported on 09/17/2018), Disp: 30 capsule, Rfl:  .  Cyanocobalamin (B-12) 1000 MCG SUBL, Place 1 mcg under the tongue daily. (Patient not taking: Reported on 09/17/2018), Disp: 90 each, Rfl: 1 .  losartan (COZAAR) 50  MG tablet, TAKE 1 TABLET BY MOUTH by mouth twice a day (Patient not taking: Reported on 09/17/2018), Disp: 180 tablet, Rfl: 0 .  metFORMIN (GLUCOPHAGE-XR) 750 MG 24 hr tablet, Take 2 tablets (1,500 mg total) by mouth daily with breakfast. (Patient not taking: Reported on 09/17/2018), Disp: 180 tablet, Rfl: 1 .  omega-3 acid ethyl esters (LOVAZA) 1 g capsule, Take 1 capsule (1 g total) by mouth 2 (two) times daily. (Patient not taking: Reported on 09/17/2018), Disp: 180 capsule, Rfl: 1  Allergies  Allergen Reactions  . Trazodone And Nefazodone   . Trazodone Anxiety    I personally reviewed active problem list, medication list, allergies, family history, social history with the patient/caregiver today.   ROS  Ten systems reviewed and is negative except as mentioned in HPI   Objective  Virtual encounter, vitals not obtained.  Body mass index is 34.87 kg/m.  Physical Exam  Awake, alert and oriented   PHQ2/9: [facility] 2/9 09/17/2018 06/06/2018 10/05/2017 06/26/2017 01/05/2017  Decreased Interest 0 0 0 0 0  Down, Depressed, Hopeless 0 0 0 1 0  PHQ - 2 Score 0 0 0 1 0  Altered sleeping 0 0 0 0 -  Tired, decreased energy 0 0 0 1 -  Change in appetite 0 0 0 0 -  Feeling bad or failure about yourself  0 0 0 0 -  Trouble concentrating 0 0 0 0 -  Moving  slowly or fidgety/restless 0 0 0 0 -  Suicidal thoughts 0 0 0 0 -  PHQ-9 Score 0 0 0 2 -  Difficult doing work/chores Not difficult at all - Not difficult at all Not difficult at all -   PHQ-2/9 Result is negative.    Fall Risk: Fall Risk  09/17/2018 06/06/2018 10/05/2017 06/26/2017 01/05/2017  Falls in the past year? 0 0 No No No  Number falls in past yr: 0 0 - - -  Comment - - - - -  Injury with Fall? 0 0 - - -  Comment - - - - -  Risk Factor Category  - - - - -  Risk for fall due to : - - - - -  Follow up - - - - -    Assessment & Plan  1. Dyslipidemia associated with type 2 diabetes mellitus (HCC)  - Lipid panel - Hemoglobin A1c - Microalbumin / creatinine urine ratio - metFORMIN (GLUCOPHAGE) 1000 MG tablet; Take 1 tablet (1,000 mg total) by mouth 2 (two) times daily with a meal.  Dispense: 180 tablet; Refill: 0  2. Obstructive apnea   3. Benign hypertension with chronic kidney disease, stage II  Needs to check bp at home  4. Insomnia, persistent  Continue medication  5. Benign essential HTN  - Comprehensive metabolic panel - CBC with Differential/Platelet  6. Morbid obesity, unspecified obesity type [facility](HCC)  Discussed with the patient the risk posed by an increased BMI. Discussed importance of portion control, calorie counting and at least 150 minutes of physical activity weekly. Avoid sweet beverages and drink more water. Eat at least 6 servings of fruit and vegetables daily   7. Vitamin D deficiency  - VITAMIN D 25 Hydroxy (Vit-D Deficiency, Fractures)  8. B12 deficiency  - B12 and Folate Panel  I discussed the assessment and treatment plan with the patient. The patient was provided an opportunity to ask questions and all were answered. The patient agreed with the plan and demonstrated an understanding of the instructions.  The patient was advised to call back or seek an in-person evaluation if the symptoms worsen or if the condition fails to improve as  anticipated.  I provided 25 minutes of non-face-to-face time during this encounter.

## 2018-09-18 DIAGNOSIS — E785 Hyperlipidemia, unspecified: Secondary | ICD-10-CM | POA: Diagnosis not present

## 2018-09-18 DIAGNOSIS — E559 Vitamin D deficiency, unspecified: Secondary | ICD-10-CM | POA: Diagnosis not present

## 2018-09-18 DIAGNOSIS — E1169 Type 2 diabetes mellitus with other specified complication: Secondary | ICD-10-CM | POA: Diagnosis not present

## 2018-09-18 DIAGNOSIS — E538 Deficiency of other specified B group vitamins: Secondary | ICD-10-CM | POA: Diagnosis not present

## 2018-09-18 DIAGNOSIS — I10 Essential (primary) hypertension: Secondary | ICD-10-CM | POA: Diagnosis not present

## 2018-09-19 LAB — CBC WITH DIFFERENTIAL/PLATELET
Basophils Absolute: 0.1 10*3/uL (ref 0.0–0.2)
Basos: 1 %
EOS (ABSOLUTE): 0.3 10*3/uL (ref 0.0–0.4)
Eos: 4 %
Hematocrit: 38.6 % (ref 37.5–51.0)
Hemoglobin: 13.3 g/dL (ref 13.0–17.7)
Immature Grans (Abs): 0 10*3/uL (ref 0.0–0.1)
Immature Granulocytes: 0 %
Lymphocytes Absolute: 1.7 10*3/uL (ref 0.7–3.1)
Lymphs: 25 %
MCH: 31.5 pg (ref 26.6–33.0)
MCHC: 34.5 g/dL (ref 31.5–35.7)
MCV: 92 fL (ref 79–97)
Monocytes Absolute: 0.5 10*3/uL (ref 0.1–0.9)
Monocytes: 8 %
Neutrophils Absolute: 4.3 10*3/uL (ref 1.4–7.0)
Neutrophils: 62 %
Platelets: 198 10*3/uL (ref 150–450)
RBC: 4.22 x10E6/uL (ref 4.14–5.80)
RDW: 13.1 % (ref 11.6–15.4)
WBC: 6.9 10*3/uL (ref 3.4–10.8)

## 2018-09-19 LAB — COMPREHENSIVE METABOLIC PANEL
ALT: 39 IU/L (ref 0–44)
AST: 28 IU/L (ref 0–40)
Albumin/Globulin Ratio: 1.7 (ref 1.2–2.2)
Albumin: 4.3 g/dL (ref 3.8–4.8)
Alkaline Phosphatase: 81 IU/L (ref 39–117)
BUN/Creatinine Ratio: 11 (ref 10–24)
BUN: 13 mg/dL (ref 8–27)
Bilirubin Total: 1 mg/dL (ref 0.0–1.2)
CO2: 20 mmol/L (ref 20–29)
Calcium: 9.5 mg/dL (ref 8.6–10.2)
Chloride: 104 mmol/L (ref 96–106)
Creatinine, Ser: 1.23 mg/dL (ref 0.76–1.27)
GFR calc Af Amer: 68 mL/min/{1.73_m2} (ref >59)
GFR calc non Af Amer: 59 mL/min/{1.73_m2} — ABNORMAL LOW (ref >59)
Globulin, Total: 2.5 g/dL (ref 1.5–4.5)
Glucose: 202 mg/dL — ABNORMAL HIGH (ref 65–99)
Potassium: 4.6 mmol/L (ref 3.5–5.2)
Sodium: 141 mmol/L (ref 134–144)
Total Protein: 6.8 g/dL (ref 6.0–8.5)

## 2018-09-19 LAB — B12 AND FOLATE PANEL
Folate: 14.6 ng/mL (ref >3.0)
Vitamin B-12: 337 pg/mL (ref 232–1245)

## 2018-09-19 LAB — HEMOGLOBIN A1C
Est. average glucose Bld gHb Est-mCnc: 183 mg/dL
Hgb A1c MFr Bld: 8 % — ABNORMAL HIGH (ref 4.8–5.6)

## 2018-09-19 LAB — LIPID PANEL
Chol/HDL Ratio: 4.9 ratio (ref 0.0–5.0)
Cholesterol, Total: 142 mg/dL (ref 100–199)
HDL: 29 mg/dL — ABNORMAL LOW (ref >39)
LDL Calculated: 63 mg/dL (ref 0–99)
Triglycerides: 251 mg/dL — ABNORMAL HIGH (ref 0–149)
VLDL Cholesterol Cal: 50 mg/dL — ABNORMAL HIGH (ref 5–40)

## 2018-09-19 LAB — VITAMIN D 25 HYDROXY (VIT D DEFICIENCY, FRACTURES): Vit D, 25-Hydroxy: 16 ng/mL — ABNORMAL LOW (ref 30.0–100.0)

## 2018-10-23 IMAGING — US US EXTREM LOW VENOUS*L*
1 series · 13 of 24 positions shown · non-contrast
Comparison: None.

CLINICAL DATA: Acute onset of left knee pain and swelling. Initial
encounter.



[Series 1: us extrem low venous*left* · 0.08mm/px · 13 of 33 slices shown]
[im 1/33]
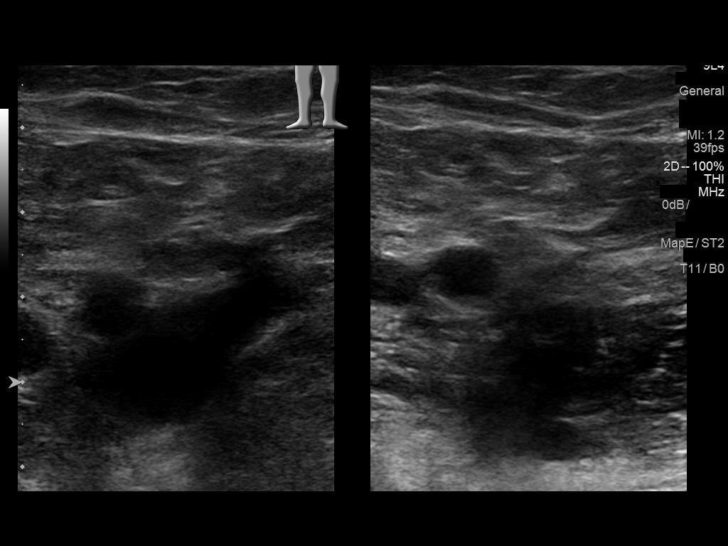
[im 3/33]
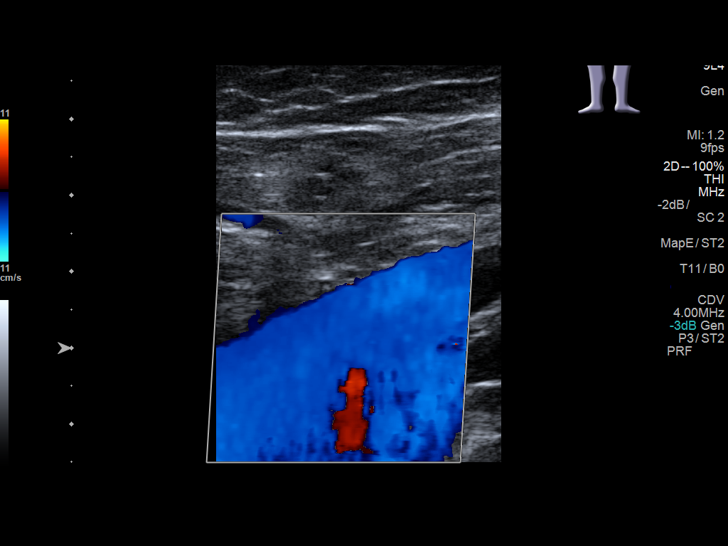
[im 6/33]
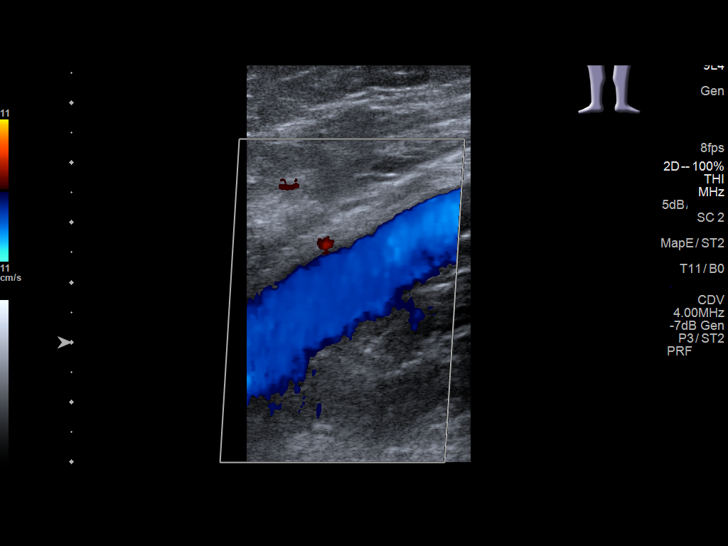
[im 9/33]
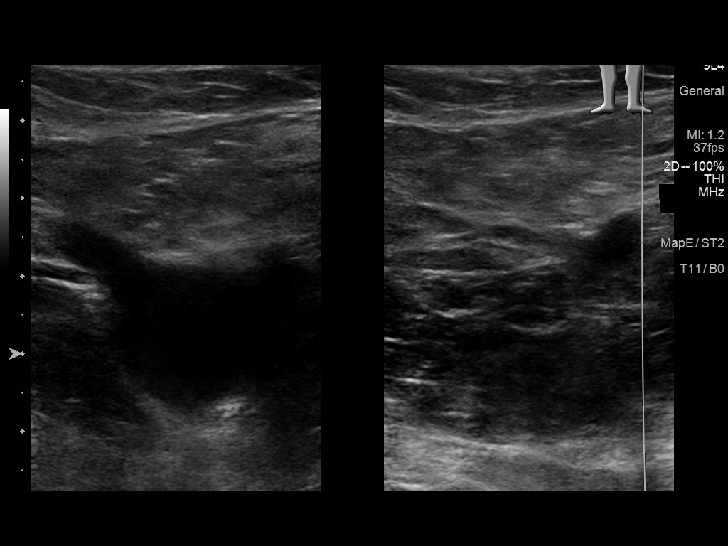
[im 12/33]
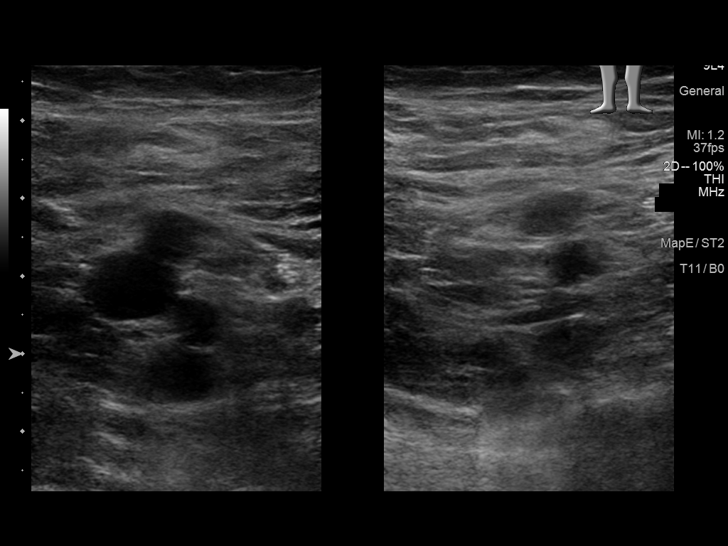
[im 14/33]
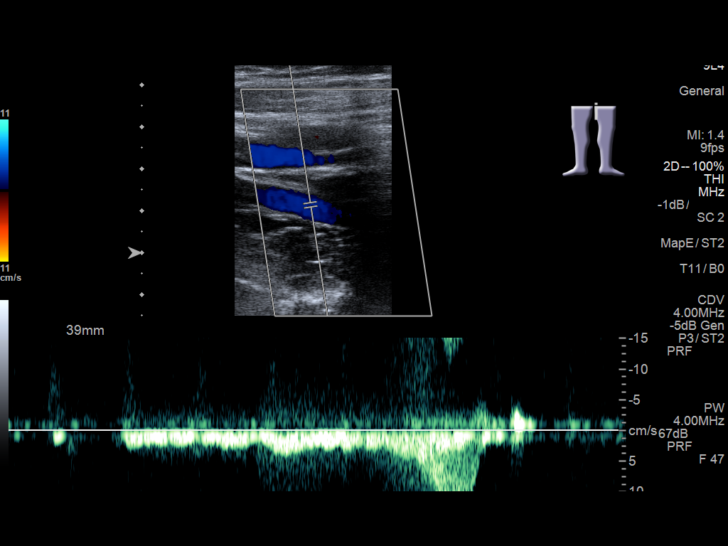
[im 17/33]
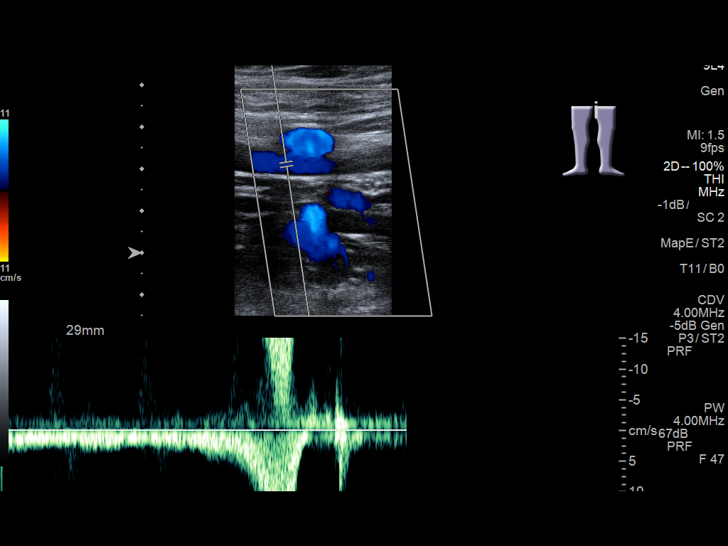
[im 19/33]
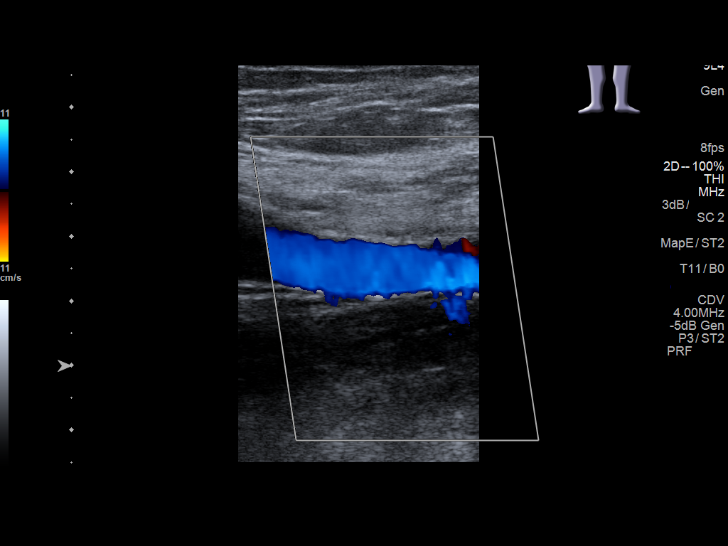
[im 21/33]
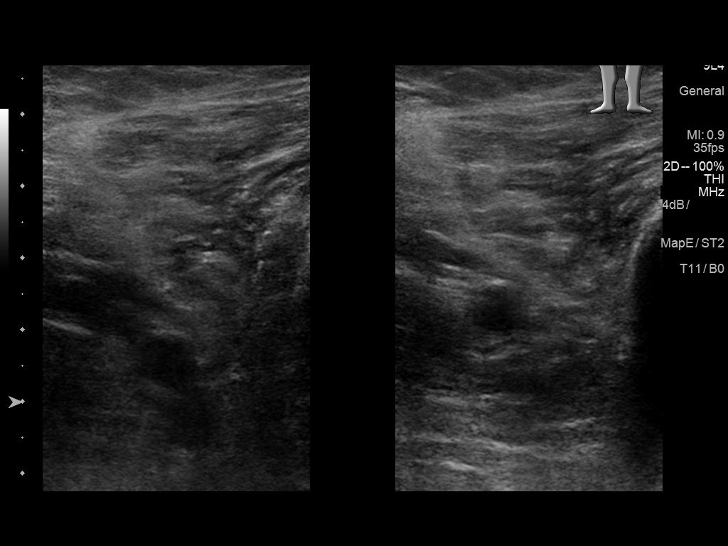
[im 24/33]
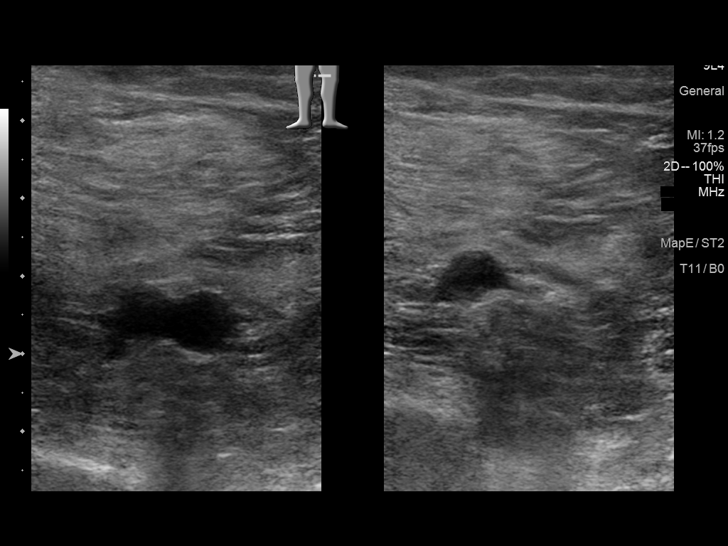
[im 27/33]
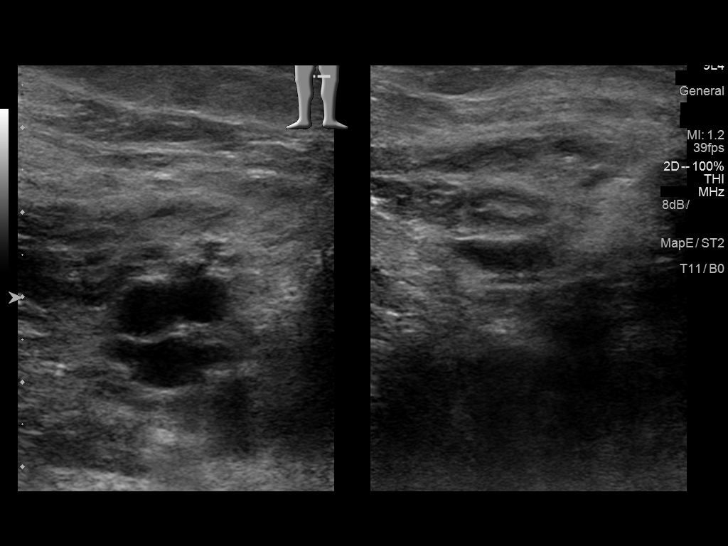
[im 30/33]
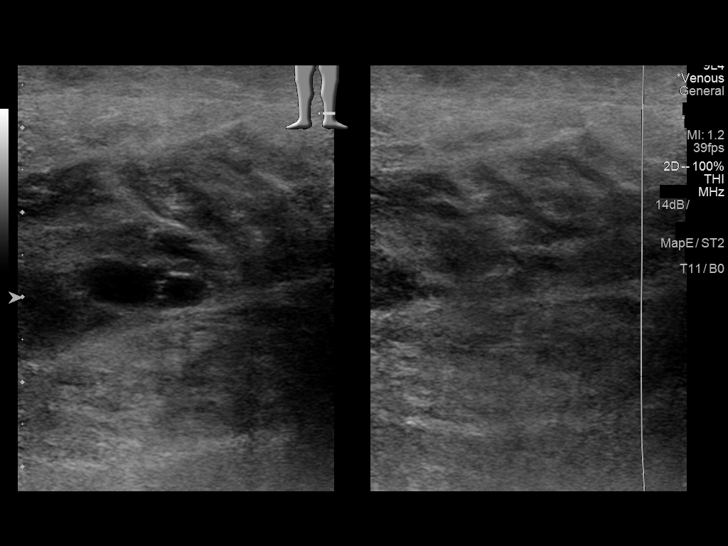
[im 33/33]
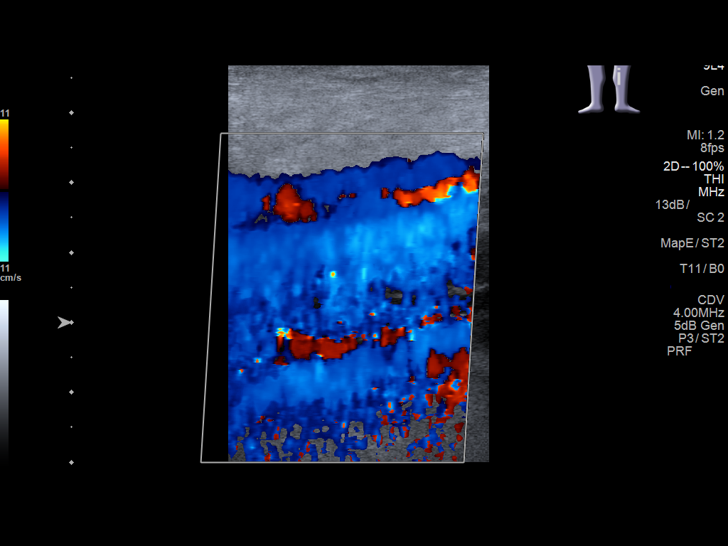

[13 of 24 positions shown; findings below may reference images not displayed]

FINDINGS: Contralateral Common Femoral Vein: Respiratory phasicity is normal
and symmetric with the symptomatic side. No evidence of thrombus.
Normal compressibility.

Common Femoral Vein: No evidence of thrombus. Normal
compressibility, respiratory phasicity and response to augmentation.

Saphenofemoral Junction: No evidence of thrombus. Normal
compressibility and flow on color Doppler imaging.

Profunda Femoral Vein: No evidence of thrombus. Normal
compressibility and flow on color Doppler imaging.

Femoral Vein: No evidence of thrombus. Normal compressibility,
respiratory phasicity and response to augmentation.

Popliteal Vein: No evidence of thrombus. Normal compressibility,
respiratory phasicity and response to augmentation.

Calf Veins: No evidence of thrombus. Normal compressibility and flow
on color Doppler imaging.

Superficial Great Saphenous Vein: No evidence of thrombus. Normal
compressibility and flow on color Doppler imaging.

Venous Reflux:  None.

Other Findings:  None.
IMPRESSION: No evidence of deep venous thrombosis.

## 2018-10-30 DIAGNOSIS — E119 Type 2 diabetes mellitus without complications: Secondary | ICD-10-CM | POA: Diagnosis not present

## 2018-10-30 LAB — HM DIABETES EYE EXAM

## 2018-11-23 ENCOUNTER — Encounter: Payer: Self-pay | Admitting: Family Medicine

## 2018-11-23 ENCOUNTER — Ambulatory Visit (INDEPENDENT_AMBULATORY_CARE_PROVIDER_SITE_OTHER): Payer: Self-pay | Admitting: Family Medicine

## 2018-11-23 VITALS — Ht 70.0 in | Wt 241.0 lb

## 2018-11-23 DIAGNOSIS — E785 Hyperlipidemia, unspecified: Secondary | ICD-10-CM

## 2018-11-23 DIAGNOSIS — E1169 Type 2 diabetes mellitus with other specified complication: Secondary | ICD-10-CM

## 2018-11-23 DIAGNOSIS — Z23 Encounter for immunization: Secondary | ICD-10-CM

## 2018-11-23 DIAGNOSIS — I10 Essential (primary) hypertension: Secondary | ICD-10-CM

## 2018-11-23 NOTE — Progress Notes (Signed)
Name: Brent Carroll   MRN: [id number]    DOB: 1945/03/06   Date:11/24/2018       Progress Note  Subjective  Chief Complaint  Chief Complaint  Patient presents with  . Diabetes    Has felt good-does not have a meter at home  . Hypertension    Ankles swell but not as bad as they used too  . Sleep Apnea  . Insomnia    Doing well with the sleep medication-unable to tolerate CPAP machine    I connected with  Sharone L Saindon on 11/24/18 at  9:20 AM EDT by telephone and verified that I am speaking with the correct person using two identifiers.  I discussed the limitations, risks, security and privacy concerns of performing an evaluation and management service by telephone and the availability of in person appointments. Staff also discussed with the patient that there may be a patient responsible charge related to this service. Patient Location: at home Provider Location: [facility]   HPI  DMII: today's follow up was for DM check because his A1C was in the 6.5-6.6% for over 2 years, but last visit in July it went up to 8%. He states he occasionally skips medications, but did not change his diet and was surprised with results. A1C last time was done at [facility] and unlikely to be an error. He states his routine has changed since wife has been working from home. He used to get up around 9-10 am, eat eggs and not eat until 1-2 pm, but now he is getting fast food most days for lunch, but trying to avoid fries. He states for dinner he also gets take out. He states he does not eat much of his food. He denies polyphagia or polyuria, but has noticed polydipsia.   HTN: unable to check bp, he denies chest pain, palpitation , ankle edema has improved  Patient Active Problem List   Diagnosis Date Noted  . Diabetic neuropathy ([place]) 01/05/2017  . Morbid obesity, unspecified obesity type ([place]) 10/07/2016  . B12 deficiency 07/08/2016  . Dyslipidemia associated with type 2 diabetes  mellitus ([place]) 11/12/2015  . 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 Vitamin D deficiency 08/14/2015  . CN (constipation) 01/07/2015  . Hyperlipidemia 01/07/2015  . Chronic kidney disease, stage II (mild) 08/25/2014  . Narrowing of intervertebral disc space 08/25/2014  . Type 2 diabetes mellitus with other diabetic kidney complication ([place]) 43/32/9518  . Failure of erection 08/25/2014  . Acid reflux 08/25/2014  . 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 Obstructive apnea 08/25/2014  . Benign essential HTN 03/16/2009  . Insomnia, persistent 02/10/2009    Past Surgical History:  Procedure Laterality Date  . HERNIA REPAIR    . HERNIA REPAIR Bilateral    inguinal   . TONSILLECTOMY      Family History  Problem Relation Age of Onset  . Heart disease Father   . COPD Father     Social History   Socioeconomic History  . Marital status: Married    Spouse name: Santiago Glad  . Number of children: 3  . Years of education: Not on file  . Highest education level: Some college, no degree  Occupational History  . Not on file  Social Needs  . Financial resource strain: Not hard at all  . Food insecurity    Worry: Never true    Inability: Never true  . Transportation needs    Medical: No  Non-medical: No  Tobacco Use  . Smoking status: Former Smoker    Quit date: 04/04/1986    Years since quitting: 32.6  . Smokeless tobacco: Never Used  Substance and Sexual Activity  . Alcohol use: No    Alcohol/week: 0.0 standard drinks  . Drug use: No  . Sexual activity: Not Currently    Partners: Female  Lifestyle  . Physical activity    Days per week: 2 days    Minutes per session: 10 min  . Stress: Not at all  Relationships  . Social connections    Talks on phone: More than three times a week    Gets together: More than three times a week    Attends religious service: More than 4 times per year    Active member of club or organization: No    Attends  meetings of clubs or organizations: Never    Relationship status: Married  . Intimate partner violence    Fear of current or ex partner: No    Emotionally abused: No    Physically abused: No    Forced sexual activity: No  Other Topics Concern  . Not on file  Social History Narrative  . Not on file     Current Outpatient Medications:  .  acetaminophen (TYLENOL) 500 MG tablet, Take 1 tablet (500 mg total) by mouth every 6 (six) hours as needed., Disp: 60 tablet, Rfl: 0 .  atorvastatin (LIPITOR) 40 MG tablet, Take 1 tablet (40 mg total) by mouth daily., Disp: 90 tablet, Rfl: 1 .  Cholecalciferol (VITAMIN D) 2000 units CAPS, Take 1 capsule (2,000 Units total) by mouth daily., Disp: 30 capsule, Rfl:  .  Cyanocobalamin (B-12) 1000 MCG SUBL, Place 1 mcg under the tongue daily., Disp: 90 each, Rfl: 1 .  gabapentin (NEURONTIN) 300 MG capsule, Take 1 capsule (300 mg total) by mouth 2 (two) times daily. TAKE 1 TO 2 CAPSULES BY  MOUTH AT BEDTIME (Patient taking differently: Take 600 mg by mouth at bedtime. TAKE 1 TO 2 CAPSULES BY  MOUTH AT BEDTIME), Disp: 180 capsule, Rfl: 1 .  hydrOXYzine (ATARAX/VISTARIL) 25 MG tablet, Take 1 tablet (25 mg total) by mouth at bedtime., Disp: 90 tablet, Rfl: 0 .  losartan (COZAAR) 100 MG tablet, Take 1 tablet (100 mg total) by mouth daily., Disp: 90 tablet, Rfl: 1 .  metFORMIN (GLUCOPHAGE) 1000 MG tablet, Take 1 tablet (1,000 mg total) by mouth 2 (two) times daily with a meal., Disp: 180 tablet, Rfl: 0 .  QUEtiapine (SEROQUEL) 25 MG tablet, Take 1 tablet (25 mg total) by mouth at bedtime., Disp: 90 tablet, Rfl: 0  Allergies  Allergen Reactions  . Trazodone And Nefazodone   . Trazodone Anxiety    I personally reviewed active problem list, medication list, allergies, family history, social history, health maintenance with the patient/caregiver today.   ROS  Ten systems reviewed and is negative except as mentioned in HPI   Objective  Virtual encounter,  vitals not obtained.  Today's Vitals   11/23/18 0844  Weight: 241 lb (109.3 kg)  Height: 5\' 10"  (1.778 m)   Body mass index is 34.58 kg/m.  Body mass index is 34.58 kg/m.  Physical Exam  Awake, alert and oriented  PHQ2/9: Depression screen [facility] 2/9 11/23/2018 09/17/2018 06/06/2018 10/05/2017 06/26/2017  Decreased Interest 0 0 0 0 0  Down, Depressed, Hopeless 0 0 0 0 1  PHQ - 2 Score 0 0 0 0 1  Altered sleeping 0 0  0 0 0  Tired, decreased energy 0 0 0 0 1  Change in appetite 0 0 0 0 0  Feeling bad or failure about yourself  0 0 0 0 0  Trouble concentrating 0 0 0 0 0  Moving slowly or fidgety/restless 0 0 0 0 0  Suicidal thoughts 0 0 0 0 0  PHQ-9 Score 0 0 0 0 2  Difficult doing work/chores Not difficult at all Not difficult at all - Not difficult at all Not difficult at all   PHQ-2/9 Result is negative.    Fall Risk: Fall Risk  11/23/2018 09/17/2018 06/06/2018 10/05/2017 06/26/2017  Falls in the past year? 0 0 0 No No  Number falls in past yr: 0 0 0 - -  Comment - - - - -  Injury with Fall? 0 0 0 - -  Comment - - - - -  Risk Factor Category  - - - - -  Risk for fall due to : - - - - -  Follow up - - - - -    Assessment & Plan  1. Dyslipidemia associated with type 2 diabetes mellitus (HCC)  - Hemoglobin A1c - Microalbumin / creatinine urine ratio  He thinks the A1C was not correct, his wife works at [employer] and he would like to have labs repeated before adjusting medications. D[facility]ed [facility], discussed possible side effects and also importance of life style modification, especially avoiding eating fast food but he states it will be very difficult for him to do so. Discussed dietician referral also but he would like to check labs first. He will stop by this afternoon for flu vaccine and get lab slip.   2. Benign essential HTN  - Microalbumin / creatinine urine   3. Need for immunization against influenza  - Flu Vaccine QUAD High Dose(Fluad)   I discussed the assessment and treatment plan with the patient. The patient was provided an opportunity to ask questions and all were answered. The patient agreed with the plan and demonstrated an understanding of the instructions.   The patient was advised to call back or seek an in-person evaluation if the symptoms worsen or if the condition fails to improve as anticipated.  I provided 15  minutes of non-face-to-face time during this encounter.  Ruel FavorsKrichna F Chanae Gemma, MD

## 2018-11-27 DIAGNOSIS — E785 Hyperlipidemia, unspecified: Secondary | ICD-10-CM | POA: Diagnosis not present

## 2018-11-27 DIAGNOSIS — E1169 Type 2 diabetes mellitus with other specified complication: Secondary | ICD-10-CM | POA: Diagnosis not present

## 2018-11-27 DIAGNOSIS — I10 Essential (primary) hypertension: Secondary | ICD-10-CM | POA: Diagnosis not present

## 2018-11-28 LAB — HEMOGLOBIN A1C
Est. average glucose Bld gHb Est-mCnc: 166 mg/dL
Hgb A1c MFr Bld: 7.4 % — ABNORMAL HIGH (ref 4.8–5.6)

## 2018-11-28 LAB — MICROALBUMIN / CREATININE URINE RATIO
Creatinine, Urine: 116.5 mg/dL
Microalb/Creat Ratio: 3 mg/g{creat} (ref 0–29)
Microalbumin, Urine: 3.3 ug/mL

## 2018-12-27 ENCOUNTER — Other Ambulatory Visit: Payer: Self-pay | Admitting: Family Medicine

## 2018-12-27 DIAGNOSIS — E1142 Type 2 diabetes mellitus with diabetic polyneuropathy: Secondary | ICD-10-CM

## 2018-12-27 MED ORDER — GABAPENTIN 300 MG PO CAPS: 600.0000 mg | ORAL_CAPSULE | Freq: Every day | ORAL | 0 refills | Status: DC

## 2018-12-27 NOTE — Telephone Encounter (Signed)
Pt request refill   gabapentin (NEURONTIN) 300 MG capsule  90 day  [id number] - [address], [place] [phone] (Phone) [phone] (Fax)

## 2019-01-23 ENCOUNTER — Ambulatory Visit (INDEPENDENT_AMBULATORY_CARE_PROVIDER_SITE_OTHER): Payer: Self-pay | Admitting: Family Medicine

## 2019-01-23 ENCOUNTER — Encounter: Payer: Self-pay | Admitting: Family Medicine

## 2019-01-23 ENCOUNTER — Other Ambulatory Visit: Payer: Self-pay

## 2019-01-23 VITALS — BP 114/70 | HR 93 | Temp 97.3°F | Resp 16 | Ht 70.0 in | Wt 242.9 lb

## 2019-01-23 DIAGNOSIS — E1169 Type 2 diabetes mellitus with other specified complication: Secondary | ICD-10-CM

## 2019-01-23 DIAGNOSIS — Z1211 Encounter for screening for malignant neoplasm of colon: Secondary | ICD-10-CM

## 2019-01-23 DIAGNOSIS — E785 Hyperlipidemia, unspecified: Secondary | ICD-10-CM

## 2019-01-23 DIAGNOSIS — G4700 Insomnia, unspecified: Secondary | ICD-10-CM

## 2019-01-23 DIAGNOSIS — N182 Chronic kidney disease, stage 2 (mild): Secondary | ICD-10-CM

## 2019-01-23 DIAGNOSIS — E1142 Type 2 diabetes mellitus with diabetic polyneuropathy: Secondary | ICD-10-CM

## 2019-01-23 DIAGNOSIS — E1122 Type 2 diabetes mellitus with diabetic chronic kidney disease: Secondary | ICD-10-CM

## 2019-01-23 DIAGNOSIS — I10 Essential (primary) hypertension: Secondary | ICD-10-CM

## 2019-01-23 MED ORDER — METFORMIN HCL 1000 MG PO TABS: 1000.0000 mg | ORAL_TABLET | Freq: Two times a day (BID) | ORAL | 1 refills | Status: DC

## 2019-01-23 MED ORDER — LOSARTAN POTASSIUM 100 MG PO TABS: 100.0000 mg | ORAL_TABLET | Freq: Every day | ORAL | 1 refills | Status: DC

## 2019-01-23 MED ORDER — ATORVASTATIN CALCIUM 40 MG PO TABS: 40.0000 mg | ORAL_TABLET | Freq: Every day | ORAL | 1 refills | Status: DC

## 2019-01-23 MED ORDER — QUETIAPINE FUMARATE 25 MG PO TABS: 25.0000 mg | ORAL_TABLET | Freq: Every day | ORAL | 1 refills | Status: DC

## 2019-01-23 NOTE — Progress Notes (Signed)
Name: Brent Carroll   MRN: [id number]    DOB: Aug 20, 1945   Date:01/23/2019       Progress Note  Subjective  Chief Complaint  Chief Complaint  Patient presents with  . Follow-up    2 month F/U  . Diabetes  . Dyslipidemia  . Hypertension  . Hyperlipidemia    HPI  HTN: he is taking medication as prescribed, bpis towards low end of normal, denies chest pain or palpitation, denies having orthostatic changes   Hyperlipidemia: he is off Lovaza because only have medicare and medication is too expensive, used to be under wife's insurance. Discussed last labs, also explained he can  use GoodRx, he is not sure about it. 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 He is now off glipizide and takingmetformin as prescribed, on 1000 mg BID He denies polyphagia, polyuria or polydipsia. He is on ARB, off Lovaza - because of cost but still on  Atorvastatin. He is not taking any medications for ED. Gabapentin helped with pain on both feet, he also has B12 deficiency and is taking otc supplementation, he states neuropathy has improved.He is eating better, down on drinking sodas once or twice a week, drinking more water. He states he is getting chicken filet sandwich and fries.   OSA: he states Dr. Nolberto Hanlon sleep study, he was unable to tolerate CPAP machine, discussed the increased risk of heart attacks and strokes. He is still not interested in having a repeat study at this time. Unchanged   GERD: he has occasional heartburn symptoms , episodes are less than once a month, and Tums works for him   Insomnia:he is off Ambien CR, he is taking Seroquel and gabapentin, he watches TV at nightuntil 11 pm, discussed importance of sleep hygiene. He states only has difficulty falling asleep but staying asleep for about 8 hours. He states difficulty  laying flat and breathing deeply, discussed CPAP again and sleep hygiene, also explained risk of Ambien and the fact that is not covered by insurance. Discussed elevating the head of the bed.   Obesity: he has multiple co-morbidities,weight has been stable , discussed importance of weight los  B12 and Vitamin D deficiency: discussed last labs. last B12 was 337   Vertigo: started in 2014, had MRI ordered by ENT, symptoms are better but still has occasional lack of balance , he states nothing that requires help. He states less than once a week, it resolves by itself, not associated with hearing loss or tinnitus. Unchanged.   Patient Active Problem List   Diagnosis Date Noted  . Diabetic neuropathy ([place]) 01/05/2017  . Morbid obesity, unspecified obesity type ([place]) 10/07/2016  . B12 deficiency 07/08/2016  . Dyslipidemia associated with type 2 diabetes mellitus ([place]) 11/12/2015  . 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 Vitamin D deficiency 08/14/2015  . CN (constipation) 01/07/2015  . Hyperlipidemia 01/07/2015  . Chronic kidney disease, stage II (mild) 08/25/2014  . Narrowing of intervertebral disc space 08/25/2014  . Type 2 diabetes mellitus with other diabetic kidney complication ([place]) 81/27/5170  . Failure of erection 08/25/2014  . Acid reflux 08/25/2014  . 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 Obstructive apnea 08/25/2014  . Benign essential HTN 03/16/2009  . Insomnia, persistent 02/10/2009    Past Surgical History:  Procedure Laterality Date  . HERNIA REPAIR    .  HERNIA REPAIR Bilateral    inguinal   . TONSILLECTOMY      Family History  Problem Relation Age of Onset  . Heart disease Father   . COPD Father     Social History   Socioeconomic History  . Marital status: Married    Spouse name: Clydie Braun  . Number of children: 3  . Years of education: Not on file  . Highest education level: Some college, no degree  Occupational  History  . Not on file  Social Needs  . Financial resource strain: Not hard at all  . Food insecurity    Worry: Never true    Inability: Never true  . Transportation needs    Medical: No    Non-medical: No  Tobacco Use  . Smoking status: Former Smoker    Quit date: 04/04/1986    Years since quitting: 32.8  . Smokeless tobacco: Never Used  Substance and Sexual Activity  . Alcohol use: No    Alcohol/week: 0.0 standard drinks  . Drug use: No  . Sexual activity: Not Currently    Partners: Female  Lifestyle  . Physical activity    Days per week: 2 days    Minutes per session: 10 min  . Stress: Not at all  Relationships  . Social connections    Talks on phone: More than three times a week    Gets together: More than three times a week    Attends religious service: More than 4 times per year    Active member of club or organization: No    Attends meetings of clubs or organizations: Never    Relationship status: Married  . Intimate partner violence    Fear of current or ex partner: No    Emotionally abused: No    Physically abused: No    Forced sexual activity: No  Other Topics Concern  . Not on file  Social History Narrative  . Not on file     Current Outpatient Medications:  .  acetaminophen (TYLENOL) 500 MG tablet, Take 1 tablet (500 mg total) by mouth every 6 (six) hours as needed., Disp: 60 tablet, Rfl: 0 .  atorvastatin (LIPITOR) 40 MG tablet, Take 1 tablet (40 mg total) by mouth daily., Disp: 90 tablet, Rfl: 1 .  Cholecalciferol (VITAMIN D) 2000 units CAPS, Take 1 capsule (2,000 Units total) by mouth daily., Disp: 30 capsule, Rfl:  .  Cyanocobalamin (B-12) 1000 MCG SUBL, Place 1 mcg under the tongue daily., Disp: 90 each, Rfl: 1 .  gabapentin (NEURONTIN) 300 MG capsule, Take 2 capsules (600 mg total) by mouth at bedtime. TAKE 1 TO 2 CAPSULES BY  MOUTH AT BEDTIME, Disp: 180 capsule, Rfl: 0 .  losartan (COZAAR) 100 MG tablet, Take 1 tablet (100 mg total) by mouth  daily., Disp: 90 tablet, Rfl: 1 .  metFORMIN (GLUCOPHAGE) 1000 MG tablet, Take 1 tablet (1,000 mg total) by mouth 2 (two) times daily with a meal., Disp: 180 tablet, Rfl: 1 .  QUEtiapine (SEROQUEL) 25 MG tablet, Take 1 tablet (25 mg total) by mouth at bedtime., Disp: 90 tablet, Rfl: 1  Allergies  Allergen Reactions  . Trazodone And Nefazodone   . Trazodone Anxiety    I personally reviewed active problem list, medication list, allergies, family history, social history, health maintenance with the patient/caregiver today.   ROS  Constitutional: Negative for fever or weight change.  Respiratory: Negative for cough , positive for intermittent  shortness of breath.   Cardiovascular:  Negative for chest pain or palpitations.  Gastrointestinal: Negative for abdominal pain, no bowel changes.  Musculoskeletal: Positive  for gait problem ( balance )  But no  joint swelling.  Skin: Negative for rash.  Neurological: Negative for dizziness ( has occasional episodes of vertigo) but no  headache.  No other specific complaints in a complete review of systems (except as listed in HPI above).  Objective   Vitals:   01/23/19 1059  BP: 114/70  Pulse: 93  Resp: 16  Temp: (!) 97.3 F (36.3 C)  TempSrc: Temporal  SpO2: 98%  Weight: 242 lb 14.4 oz (110.2 kg)  Height: 5\' 10"  (1.778 m)    Body mass index is 34.85 kg/m.  Physical Exam  Constitutional: Patient appears well-developed and well-nourished. Obese  No distress.  HEENT: head atraumatic, normocephalic, pupils equal and reactive to light Cardiovascular: Normal rate, regular rhythm and normal heart sounds.  No murmur heard. No BLE edema. Pulmonary/Chest: Effort normal and breath sounds normal. No respiratory distress. Abdominal: Soft.  There is no tenderness. Psychiatric: Patient has a normal mood and affect. behavior is normal. Judgment and thought content normal.  Recent Results (from the past 2160 hour(s))  HM DIABETES EYE EXAM      Status: None   Collection Time: 10/30/18 12:00 AM  Result Value Ref Range   HM Diabetic Eye Exam No Retinopathy No Retinopathy  Hemoglobin A1c     Status: Abnormal   Collection Time: 11/27/18 10:52 AM  Result Value Ref Range   Hgb A1c MFr Bld 7.4 (H) 4.8 - 5.6 %    Comment:          Prediabetes: 5.7 - 6.4          Diabetes: >6.4          Glycemic control for adults with diabetes: <7.0    Est. average glucose Bld gHb Est-mCnc 166 mg/dL  Microalbumin / creatinine urine ratio     Status: None   Collection Time: 11/27/18 10:52 AM  Result Value Ref Range   Creatinine, Urine 116.5 Not Estab. mg/dL   Microalbumin, Urine 3.3 Not Estab. ug/mL   Microalb/Creat Ratio 3 0 - 29 mg/g creat    Comment:                        Normal:                0 -  29                        Moderately increased: 30 - 300                        Severely increased:       >300               **Please note reference interval change**      PHQ2/9: [facility] 01/23/2019 11/23/2018 09/17/2018 06/06/2018 10/05/2017  Decreased Interest 0 0 0 0 0  Down, Depressed, Hopeless 0 0 0 0 0  PHQ - 2 Score 0 0 0 0 0  Altered sleeping 0 0 0 0 0  Tired, decreased energy 0 0 0 0 0  Change in appetite 0 0 0 0 0  Feeling bad or failure about yourself  0 0 0 0 0  Trouble concentrating 0 0 0 0 0  Moving slowly or fidgety/restless 0 0 0 0  0  Suicidal thoughts 0 0 0 0 0  PHQ-9 Score 0 0 0 0 0  Difficult doing work/chores - Not difficult at all Not difficult at all - Not difficult at all    phq 9 is negative   Fall Risk: Fall Risk  01/23/2019 11/23/2018 09/17/2018 06/06/2018 10/05/2017  Falls in the past year? 0 0 0 0 No  Number falls in past yr: 0 0 0 0 -  Comment - - - - -  Injury with Fall? 0 0 0 0 -  Comment - - - - -  Risk Factor Category  - - - - -  Risk for fall due to : - - - - -  Follow up - - - - -    Assessment & Plan  1. Benign essential HTN  - losartan (COZAAR) 100 MG tablet; Take 1 tablet (100 mg  total) by mouth daily.  Dispense: 90 tablet; Refill: 1  2. Type 2 diabetes mellitus with stage 2 chronic kidney disease, without long-term current use of insulin (HCC)  - losartan (COZAAR) 100 MG tablet; Take 1 tablet (100 mg total) by mouth daily.  Dispense: 90 tablet; Refill: 1  3. Dyslipidemia associated with type 2 diabetes mellitus (HCC)  - metFORMIN (GLUCOPHAGE) 1000 MG tablet; Take 1 tablet (1,000 mg total) by mouth 2 (two) times daily with a meal.  Dispense: 180 tablet; Refill: 1 - atorvastatin (LIPITOR) 40 MG tablet; Take 1 tablet (40 mg total) by mouth daily.  Dispense: 90 tablet; Refill: 1  4. Insomnia, persistent  - QUEtiapine (SEROQUEL) 25 MG tablet; Take 1 tablet (25 mg total) by mouth at bedtime.  Dispense: 90 tablet; Refill: 1  5. Diabetic peripheral neuropathy associated with type 2 diabetes mellitus (HCC)  Doing well on gabapentin   6. Colon cancer screening  - Fecal occult blood, imunochemical

## 2019-01-23 NOTE — Patient Instructions (Signed)
Bring benefits book so we can choose medications for next year Please stop at [facility] to get FIT test kit

## 2019-01-28 ENCOUNTER — Other Ambulatory Visit: Payer: Self-pay | Admitting: Family Medicine

## 2019-01-28 DIAGNOSIS — E1142 Type 2 diabetes mellitus with diabetic polyneuropathy: Secondary | ICD-10-CM

## 2019-03-25 ENCOUNTER — Other Ambulatory Visit: Payer: Self-pay

## 2019-03-25 ENCOUNTER — Ambulatory Visit (INDEPENDENT_AMBULATORY_CARE_PROVIDER_SITE_OTHER): Payer: Self-pay | Admitting: Family Medicine

## 2019-03-25 ENCOUNTER — Encounter: Payer: Self-pay | Admitting: Family Medicine

## 2019-03-25 VITALS — BP 124/80 | HR 99 | Temp 97.3°F | Resp 16 | Ht 70.0 in | Wt 243.9 lb

## 2019-03-25 DIAGNOSIS — E1142 Type 2 diabetes mellitus with diabetic polyneuropathy: Secondary | ICD-10-CM

## 2019-03-25 DIAGNOSIS — E538 Deficiency of other specified B group vitamins: Secondary | ICD-10-CM

## 2019-03-25 DIAGNOSIS — E1122 Type 2 diabetes mellitus with diabetic chronic kidney disease: Secondary | ICD-10-CM

## 2019-03-25 DIAGNOSIS — G4733 Obstructive sleep apnea (adult) (pediatric): Secondary | ICD-10-CM

## 2019-03-25 DIAGNOSIS — I10 Essential (primary) hypertension: Secondary | ICD-10-CM

## 2019-03-25 DIAGNOSIS — E1169 Type 2 diabetes mellitus with other specified complication: Secondary | ICD-10-CM

## 2019-03-25 DIAGNOSIS — E785 Hyperlipidemia, unspecified: Secondary | ICD-10-CM

## 2019-03-25 DIAGNOSIS — I129 Hypertensive chronic kidney disease with stage 1 through stage 4 chronic kidney disease, or unspecified chronic kidney disease: Secondary | ICD-10-CM

## 2019-03-25 DIAGNOSIS — N182 Chronic kidney disease, stage 2 (mild): Secondary | ICD-10-CM

## 2019-03-25 DIAGNOSIS — E559 Vitamin D deficiency, unspecified: Secondary | ICD-10-CM

## 2019-03-25 DIAGNOSIS — G4700 Insomnia, unspecified: Secondary | ICD-10-CM

## 2019-03-25 LAB — POCT GLYCOSYLATED HEMOGLOBIN (HGB A1C): Hemoglobin A1C: 6.8 % — AB (ref 4.0–5.6)

## 2019-03-25 MED ORDER — GABAPENTIN 300 MG PO CAPS: 600.0000 mg | ORAL_CAPSULE | Freq: Every day | ORAL | 1 refills | Status: DC

## 2019-03-25 NOTE — Progress Notes (Signed)
Name: Brent Carroll   MRN: [id number]    DOB: [birth year]   Date:03/25/2019       Progress Note  Subjective  Chief Complaint  Chief Complaint  Patient presents with  . Hypertension  . Diabetes  . Gastroesophageal Reflux  . Dyslipidemia    HPI  DMII: today's follow up was for DM check because his A1C was in the 6.5-6.6% for over 2 years, but last visit in July it went up to 8% , down to 7.4% today is down to 6.8% . He has been compliant with medication and drinking more water.  They are cutting down on fast food. He denies polyphagia or polyuria, but has noticed polydipsia.   HTN: he is taking medication as prescribed, bp is at goal, no chest pain or palpitation.   Hyperlipidemia: he is off Lovaza because only have medicare and medication is too expensive, discussed goodrx and let me know if willing to pay cash for it. Continue Crestor for now  OSA: he states Dr. Nolberto Hanlon sleep study, he was unable to tolerate CPAP machine, discussed the increased risk of heart attacks and strokes. He is still not interested in having a repeat study at this time. Unchanged   GERD: he has occasional heartburn symptoms and takes Tums prn. He is doing well at this time  Insomnia:he is off Ambien CR, he is taking Seroquel and gabapentin, he watches TV at nightuntil 11 pm, but trying to fall asleep before midnight now. He states only has difficulty falling asleep but staying asleep for at lease  8 hours. Continue medications  Obesity: he has multiple co-morbidities,weight has been stable , discussed importance of weight los. He states he is drinking more water   B12 and Vitamin D deficiency: reviewed labs , he needs to continue B12 and vitamin D supplementation   Patient Active Problem List   Diagnosis Date Noted  . Diabetic neuropathy ([place]) 01/05/2017  . Morbid obesity, unspecified obesity type ([place]) 10/07/2016  . B12 deficiency 07/08/2016  . Dyslipidemia associated with type  2 diabetes mellitus ([place]) 11/12/2015  . 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 Vitamin D deficiency 08/14/2015  . CN (constipation) 01/07/2015  . Hyperlipidemia 01/07/2015  . Chronic kidney disease, stage II (mild) 08/25/2014  . Narrowing of intervertebral disc space 08/25/2014  . Type 2 diabetes mellitus with other diabetic kidney complication ([place]) 82/99/3716  . Failure of erection 08/25/2014  . Acid reflux 08/25/2014  . 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 Obstructive apnea 08/25/2014  . Benign essential HTN 03/16/2009  . Insomnia, persistent 02/10/2009    Past Surgical History:  Procedure Laterality Date  . HERNIA REPAIR    . HERNIA REPAIR Bilateral    inguinal   . TONSILLECTOMY      Family History  Problem Relation Age of Onset  . Heart disease Father   . COPD Father      Current Outpatient Medications:  .  acetaminophen (TYLENOL) 500 MG tablet, Take 1 tablet (500 mg total) by mouth every 6 (six) hours as needed., Disp: 60 tablet, Rfl: 0 .  atorvastatin (LIPITOR) 40 MG tablet, Take 1 tablet (40 mg total) by mouth daily., Disp: 90 tablet, Rfl: 1 .  Cyanocobalamin (B-12) 1000 MCG SUBL, Place 1 mcg under the tongue daily., Disp: 90 each, Rfl: 1 .  gabapentin (NEURONTIN) 300 MG capsule, Take 2 capsules (600 mg total) by mouth at bedtime., Disp: 180 capsule, Rfl: 1 .  losartan (COZAAR) 100 MG  tablet, Take 1 tablet (100 mg total) by mouth daily., Disp: 90 tablet, Rfl: 1 .  metFORMIN (GLUCOPHAGE) 1000 MG tablet, Take 1 tablet (1,000 mg total) by mouth 2 (two) times daily with a meal., Disp: 180 tablet, Rfl: 1 .  QUEtiapine (SEROQUEL) 25 MG tablet, Take 1 tablet (25 mg total) by mouth at bedtime., Disp: 90 tablet, Rfl: 1 .  Cholecalciferol (VITAMIN D) 2000 units CAPS, Take 1 capsule (2,000 Units total) by mouth daily., Disp: 30 capsule, Rfl:  .  hydrOXYzine (ATARAX/VISTARIL) 25 MG tablet, Take 1 tablet (25 mg total) by mouth at bedtime.,  Disp: 90 tablet, Rfl: 0  Allergies  Allergen Reactions  . Trazodone And Nefazodone   . Trazodone Anxiety    I personally reviewed active problem list, medication list, allergies, family history, social history, health maintenance with the patient/caregiver today.   ROS  Constitutional: Negative for fever or weight change.  Respiratory: mild chronic intermittent  cough and shortness of breath.   Cardiovascular: Negative for chest pain or palpitations.  Gastrointestinal: Negative for abdominal pain, no bowel changes.  Musculoskeletal: Positive for gait problem but no  joint swelling.  Skin: Negative for rash.  Neurological: Negative for dizziness or headache.  No other specific complaints in a complete review of systems (except as listed in HPI above).  Objective  Vitals:   03/25/19 1043  BP: 124/80  Pulse: 99  Resp: 16  Temp: (!) 97.3 F (36.3 C)  TempSrc: Temporal  SpO2: 95%  Weight: 243 lb 14.4 oz (110.6 kg)  Height: 5\' 10"  (1.778 m)    Body mass index is 35 kg/m.  Physical Exam  Constitutional: Patient appears well-developed and well-nourished. Obese  No distress.  HEENT: head atraumatic, normocephalic, pupils equal and reactive to light Cardiovascular: Normal rate, regular rhythm and normal heart sounds.  No murmur heard. No BLE edema. Pulmonary/Chest: Effort normal and breath sounds normal. No respiratory distress. Abdominal: Soft.  There is no tenderness. Psychiatric: Patient has a normal mood and affect. behavior is normal. Judgment and thought content normal.   Diabetic Foot Exam: Diabetic Foot Exam - Simple   Simple Foot Form Diabetic Foot exam was performed with the following findings: Yes 03/25/2019 11:21 AM  Visual Inspection See comments: Yes Sensation Testing Intact to touch and monofilament testing bilaterally: Yes Pulse Check Posterior Tibialis and Dorsalis pulse intact bilaterally: Yes Comments Thick toenails, dry heels       PHQ2/9: Depression screen [facility] 2/9 03/25/2019 01/23/2019 11/23/2018 09/17/2018 06/06/2018  Decreased Interest 0 0 0 0 0  Down, Depressed, Hopeless 0 0 0 0 0  PHQ - 2 Score 0 0 0 0 0  Altered sleeping 0 0 0 0 0  Tired, decreased energy 0 0 0 0 0  Change in appetite 0 0 0 0 0  Feeling bad or failure about yourself  0 0 0 0 0  Trouble concentrating 0 0 0 0 0  Moving slowly or fidgety/restless 0 0 0 0 0  Suicidal thoughts 0 0 0 0 0  PHQ-9 Score 0 0 0 0 0  Difficult doing work/chores - - Not difficult at all Not difficult at all -  Some recent data might be hidden    phq 9 is negative   Fall Risk: Fall Risk  03/25/2019 01/23/2019 11/23/2018 09/17/2018 06/06/2018  Falls in the past year? 0 0 0 0 0  Number falls in past yr: 0 0 0 0 0  Comment - - - - -  Injury  with Fall? 0 0 0 0 0  Comment - - - - -  Risk Factor Category  - - - - -  Risk for fall due to : - - - - -  Follow up - - - - -    Functional Status Survey: Is the patient deaf or have difficulty hearing?: No Does the patient have difficulty seeing, even when wearing glasses/contacts?: No Does the patient have difficulty concentrating, remembering, or making decisions?: No Does the patient have difficulty walking or climbing stairs?: No Does the patient have difficulty dressing or bathing?: No Does the patient have difficulty doing errands alone such as visiting a doctor's office or shopping?: No    Assessment & Plan  1. Type 2 diabetes mellitus with stage 2 chronic kidney disease, without long-term current use of insulin (HCC)  - POCT HgB A1C  2. Vitamin D deficiency   3. Obstructive apnea  No on cpap   4. Diabetic peripheral neuropathy associated with type 2 diabetes mellitus (HCC)  - gabapentin (NEURONTIN) 300 MG capsule; Take 2 capsules (600 mg total) by mouth at bedtime.  Dispense: 180 capsule; Refill: 1  5. Insomnia, persistent  stable  6. Benign essential HTN  At goal   7. Dyslipidemia associated  with type 2 diabetes mellitus (HCC)  On statin and A1C is at goal   8. B12 deficiency  He needs to take supplementation   9. Benign hypertension with chronic kidney disease, stage II

## 2019-06-18 ENCOUNTER — Other Ambulatory Visit: Payer: Self-pay | Admitting: Family Medicine

## 2019-06-18 MED ORDER — HYDROXYZINE HCL 25 MG PO TABS: ORAL_TABLET | ORAL | 0 refills | Status: DC

## 2019-06-18 NOTE — Telephone Encounter (Signed)
Medication Refill - Medication: hydroxyzine   Has the patient contacted their pharmacy? Yes.   (Agent: If no, request that the patient contact the pharmacy for the refill.) (Agent: If yes, when and what did the pharmacy advise?)  Preferred Pharmacy (with phone number or street name):  [facility] [place] [facility] [address] [place] [address]  Phone: [phone] Fax: [phone]  Not a 24 hour pharmacy; exact hours not known.     Agent: Please be advised that RX refills may take up to 3 business days. We ask that you follow-up with your pharmacy.

## 2019-07-03 ENCOUNTER — Telehealth: Payer: Self-pay | Admitting: Family Medicine

## 2019-07-03 NOTE — Chronic Care Management (AMB) (Signed)
  Chronic Care Management   Outreach Note  07/03/2019 Name: Brent Carroll MRN: [id number] DOB: [birth year]  Brent Carroll is a 74 y.o. year old male who is a primary care patient of Alba Cory, MD. I reached out to Brent Carroll by phone today in response to a referral sent by Brent Carroll health plan.     An unsuccessful telephone outreach was attempted today. The patient was referred to the case management team for assistance with care management and care coordination.   Follow Up Plan: A HIPPA compliant phone message was left for the patient providing contact information and requesting a return call.  The care management team will reach out to the patient again over the next 7 days.  If patient returns call to provider office, please advise to call Embedded Care Management Care Guide Brent Carroll at [phone]  Brent Carroll, RMA Care Guide, Embedded Care Coordination [facility]  [place], [place] [address] Direct Dial: [phone] [email or web address]

## 2019-07-08 NOTE — Chronic Care Management (AMB) (Signed)
  Chronic Care Management   Outreach Note  07/08/2019 Name: Brent Carroll MRN: [id number] DOB: 1945-04-21  Brent Carroll is a 74 y.o. year old male who is a primary care patient of Alba Cory, MD. I reached out to Brent Carroll by phone today in response to a referral sent by Brent Carroll health plan.     A second unsuccessful telephone outreach was attempted today. The patient was referred to the case management team for assistance with care management and care coordination.   Follow Up Plan: The care management team will reach out to the patient again over the next 7 days.  If patient returns call to provider office, please advise to call Embedded Care Management Care Guide Penne Lash at [phone]  Penne Lash, RMA Care Guide, Embedded Care Coordination [facility]  [place], [place] [address] Direct Dial: [phone] [email or web address]: [email or web address]

## 2019-07-12 NOTE — Chronic Care Management (AMB) (Signed)
  Chronic Care Management   Outreach Note  07/12/2019 Name: Brent Carroll MRN: [id number] DOB: 24-May-1945  [name] is a 74 y.o. year old male who is a primary care patient of Alba Cory, MD. I reached out to Arta Silence by phone today in response to a referral sent by Mr. [employer] health plan.     Third unsuccessful telephone outreach was attempted today. The patient was referred to the case management team for assistance with care management and care coordination. The patient's primary care provider has been notified of our unsuccessful attempts to make or maintain contact with the patient. The care management team is pleased to engage with this patient at any time in the future should he/she be interested in assistance from the care management team.   Follow Up Plan: The care management team is available to follow up with the patient after provider conversation with the patient regarding recommendation for care management engagement and subsequent re-referral to the care management team.   Penne Lash, RMA Care Guide, Embedded Care Coordination [facility]  [place], [place] [address] Direct Dial: [phone] [email or web address] Website: [email or web address]

## 2019-08-05 ENCOUNTER — Ambulatory Visit (INDEPENDENT_AMBULATORY_CARE_PROVIDER_SITE_OTHER): Payer: PPO | Admitting: Family Medicine

## 2019-08-05 ENCOUNTER — Encounter: Payer: Self-pay | Admitting: Family Medicine

## 2019-08-05 ENCOUNTER — Other Ambulatory Visit: Payer: Self-pay

## 2019-08-05 VITALS — BP 142/80 | HR 90 | Temp 98.1°F | Resp 18 | Ht 70.0 in | Wt 243.2 lb

## 2019-08-05 DIAGNOSIS — E538 Deficiency of other specified B group vitamins: Secondary | ICD-10-CM | POA: Diagnosis not present

## 2019-08-05 DIAGNOSIS — E1122 Type 2 diabetes mellitus with diabetic chronic kidney disease: Secondary | ICD-10-CM

## 2019-08-05 DIAGNOSIS — G4733 Obstructive sleep apnea (adult) (pediatric): Secondary | ICD-10-CM | POA: Diagnosis not present

## 2019-08-05 DIAGNOSIS — E1142 Type 2 diabetes mellitus with diabetic polyneuropathy: Secondary | ICD-10-CM

## 2019-08-05 DIAGNOSIS — G4700 Insomnia, unspecified: Secondary | ICD-10-CM | POA: Diagnosis not present

## 2019-08-05 DIAGNOSIS — E785 Hyperlipidemia, unspecified: Secondary | ICD-10-CM

## 2019-08-05 DIAGNOSIS — D649 Anemia, unspecified: Secondary | ICD-10-CM | POA: Diagnosis not present

## 2019-08-05 DIAGNOSIS — N182 Chronic kidney disease, stage 2 (mild): Secondary | ICD-10-CM | POA: Diagnosis not present

## 2019-08-05 DIAGNOSIS — I129 Hypertensive chronic kidney disease with stage 1 through stage 4 chronic kidney disease, or unspecified chronic kidney disease: Secondary | ICD-10-CM

## 2019-08-05 DIAGNOSIS — I10 Essential (primary) hypertension: Secondary | ICD-10-CM

## 2019-08-05 DIAGNOSIS — E559 Vitamin D deficiency, unspecified: Secondary | ICD-10-CM

## 2019-08-05 DIAGNOSIS — E1159 Type 2 diabetes mellitus with other circulatory complications: Secondary | ICD-10-CM | POA: Diagnosis not present

## 2019-08-05 DIAGNOSIS — E1169 Type 2 diabetes mellitus with other specified complication: Secondary | ICD-10-CM

## 2019-08-05 LAB — POCT GLYCOSYLATED HEMOGLOBIN (HGB A1C): Hemoglobin A1C: 6.7 % — AB (ref 4.0–5.6)

## 2019-08-05 MED ORDER — ATORVASTATIN CALCIUM 40 MG PO TABS: 40.0000 mg | ORAL_TABLET | Freq: Every day | ORAL | 1 refills | Status: DC

## 2019-08-05 MED ORDER — HYDROXYZINE HCL 25 MG PO TABS: ORAL_TABLET | ORAL | 1 refills | Status: DC

## 2019-08-05 MED ORDER — VITAMIN D (ERGOCALCIFEROL) 1.25 MG (50000 UNIT) PO CAPS: 50000.0000 [IU] | ORAL_CAPSULE | ORAL | 1 refills | Status: DC

## 2019-08-05 MED ORDER — QUETIAPINE FUMARATE 25 MG PO TABS: 25.0000 mg | ORAL_TABLET | Freq: Every day | ORAL | 1 refills | Status: DC

## 2019-08-05 MED ORDER — METFORMIN HCL 1000 MG PO TABS: 1000.0000 mg | ORAL_TABLET | Freq: Two times a day (BID) | ORAL | 1 refills | Status: DC

## 2019-08-05 MED ORDER — GABAPENTIN 300 MG PO CAPS: 600.0000 mg | ORAL_CAPSULE | Freq: Every day | ORAL | 1 refills | Status: DC

## 2019-08-05 MED ORDER — LOSARTAN POTASSIUM 100 MG PO TABS: 100.0000 mg | ORAL_TABLET | Freq: Every day | ORAL | 1 refills | Status: DC

## 2019-08-05 NOTE — Progress Notes (Signed)
Name: Brent Carroll   MRN: [id number]    DOB: April 21, 1945   Date:08/05/2019       Progress Note  Subjective  Chief Complaint  Chief Complaint  Patient presents with  . Follow-up  . Diabetes    HPI  DMII: A1C today is at goal at  6.7 %  . He has been compliant with medication and drinking more water.  They are cutting down on fast food. He denies polyphagia or polyuria, but has noticed polydipsia. He is due for urine micro, advised to have eye exam yearly. DM with [facility] on ARB, he states pain on legs has been stable on gabapentin, has dyslipidemia and is only on crestor, cannot afford lovaza   HTN: he is taking medication as prescribed, no chest pain or palpitation. BP today is borderline.He is not checking bp at home   Hyperlipidemia: he isoff Lovaza because only have medicare and medication is too expensive, discussed goodrx but he states wife retired and they are living on a lower income monthly  Continue Crestor for now, we will recheck labs   OSA: he states Dr. Brett Albino sleep study, he was unable to tolerate CPAP machine, he also states too expensive.   GERD: he has occasional heartburn symptoms and takes Tums prn. Unchanged   Insomnia:he is off Ambien CR, he is taking Seroquel and gabapentin, he watches TV at nightuntil 11 pm, but trying to fall asleep before midnight now. He states only has difficulty falling asleep but staying asleep for at lease  8 hours. Continue medications. Denies side effects. He gets up to void occasionally and has difficulty falling back asleep, but it only happens about once a week   Obesity: he has multiple co-morbidities,weight has been stable , discussed importance of weight los. He states he is drinking more water and no longer eating out as often.    B12 and Vitamin D deficiency: reviewed labs , he has not been compliant with supplementation   Patient Active Problem List   Diagnosis Date Noted  . Diabetic neuropathy (HCC)  01/05/2017  . Morbid obesity, unspecified obesity type (HCC) 10/07/2016  . B12 deficiency 07/08/2016  . Dyslipidemia associated with type 2 diabetes mellitus (HCC) 11/12/2015  . 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 Vitamin D deficiency 08/14/2015  . CN (constipation) 01/07/2015  . Hyperlipidemia 01/07/2015  . Chronic kidney disease, stage II (mild) 08/25/2014  . Narrowing of intervertebral disc space 08/25/2014  . Type 2 diabetes mellitus with other diabetic kidney complication (HCC) 08/25/2014  . Failure of erection 08/25/2014  . Acid reflux 08/25/2014  . 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 Obstructive apnea 08/25/2014  . Benign essential HTN 03/16/2009  . Insomnia, persistent 02/10/2009    Past Surgical History:  Procedure Laterality Date  . HERNIA REPAIR    . HERNIA REPAIR Bilateral    inguinal   . TONSILLECTOMY      Family History  Problem Relation Age of Onset  . Heart disease Father   . COPD Father     Social History   Tobacco Use  . Smoking status: Former Smoker    Quit date: 04/04/1986    Years since quitting: 33.3  . Smokeless tobacco: Never Used  Substance Use Topics  . Alcohol use: No    Alcohol/week: 0.0 standard drinks     Current Outpatient Medications:  .  acetaminophen (TYLENOL) 500 MG tablet, Take 1 tablet (500 mg total) by mouth every 6 (six) hours as  needed., Disp: 60 tablet, Rfl: 0 .  atorvastatin (LIPITOR) 40 MG tablet, Take 1 tablet (40 mg total) by mouth daily., Disp: 90 tablet, Rfl: 1 .  Cholecalciferol (VITAMIN D) 2000 units CAPS, Take 1 capsule (2,000 Units total) by mouth daily., Disp: 30 capsule, Rfl:  .  Cyanocobalamin (B-12) 1000 MCG SUBL, Place 1 mcg under the tongue daily., Disp: 90 each, Rfl: 1 .  gabapentin (NEURONTIN) 300 MG capsule, Take 2 capsules (600 mg total) by mouth at bedtime., Disp: 180 capsule, Rfl: 1 .  hydrOXYzine (ATARAX/VISTARIL) 25 MG tablet, Take 1 tablet (25 mg total) by  mouth at bedtime., Disp: 90 tablet, Rfl: 0 .  losartan (COZAAR) 100 MG tablet, Take 1 tablet (100 mg total) by mouth daily., Disp: 90 tablet, Rfl: 1 .  metFORMIN (GLUCOPHAGE) 1000 MG tablet, Take 1 tablet (1,000 mg total) by mouth 2 (two) times daily with a meal., Disp: 180 tablet, Rfl: 1 .  QUEtiapine (SEROQUEL) 25 MG tablet, Take 1 tablet (25 mg total) by mouth at bedtime., Disp: 90 tablet, Rfl: 1  Allergies  Allergen Reactions  . Trazodone And Nefazodone   . Trazodone Anxiety    I personally reviewed active problem list, medication list, allergies, family history, social history, health maintenance with the patient/caregiver today.   ROS  Constitutional: Negative for fever or weight change.  Respiratory: Negative for cough and shortness of breath.   Cardiovascular: Negative for chest pain or palpitations.  Gastrointestinal: Negative for abdominal pain, no bowel changes.  Musculoskeletal: Negative for gait problem or joint swelling.  Skin: Negative for rash.  Neurological: Negative for dizziness or headache.  No other specific complaints in a complete review of systems (except as listed in HPI above).  Objective  Vitals:   08/05/19 1046  BP: (!) 142/80  Pulse: 90  Resp: 18  Temp: 98.1 F (36.7 C)  TempSrc: Temporal  SpO2: 98%  Weight: 243 lb 3.2 oz (110.3 kg)  Height: 5\' 10"  (1.778 m)    Body mass index is 34.9 kg/m.  Physical Exam  Constitutional: Patient appears well-developed and well-nourished. Obese No distress.  HEENT: head atraumatic, normocephalic, pupils equal and reactive to light,  neck supple Cardiovascular: Normal rate, regular rhythm and normal heart sounds.  No murmur heard. No BLE edema. Pulmonary/Chest: Effort normal and breath sounds normal. No respiratory distress. Abdominal: Soft.  There is no tenderness. Psychiatric: Patient has a normal mood and affect. behavior is normal. Judgment and thought content normal.  Recent Results (from the past  2160 hour(s))  POCT HgB A1C     Status: Abnormal   Collection Time: 08/05/19 11:11 AM  Result Value Ref Range   Hemoglobin A1C 6.7 (A) 4.0 - 5.6 %   HbA1c POC (<> result, manual entry)     HbA1c, POC (prediabetic range)     HbA1c, POC (controlled diabetic range)       PHQ2/9: Depression screen [facility] 2/9 08/05/2019 03/25/2019 01/23/2019 11/23/2018 09/17/2018  Decreased Interest 0 0 0 0 0  Down, Depressed, Hopeless 0 0 0 0 0  PHQ - 2 Score 0 0 0 0 0  Altered sleeping 0 0 0 0 0  Tired, decreased energy 0 0 0 0 0  Change in appetite 0 0 0 0 0  Feeling bad or failure about yourself  0 0 0 0 0  Trouble concentrating 0 0 0 0 0  Moving slowly or fidgety/restless 0 0 0 0 0  Suicidal thoughts 0 0 0 0 0  PHQ-9 Score 0 0 0 0 0  Difficult doing work/chores Not difficult at all - - Not difficult at all Not difficult at all  Some recent data might be hidden    phq 9 is negative   Fall Risk: Fall Risk  08/05/2019 03/25/2019 01/23/2019 11/23/2018 09/17/2018  Falls in the past year? 0 0 0 0 0  Number falls in past yr: - 0 0 0 0  Comment - - - - -  Injury with Fall? - 0 0 0 0  Comment - - - - -  Risk Factor Category  - - - - -  Risk for fall due to : - - - - -  Follow up - - - - -     Functional Status Survey: Is the patient deaf or have difficulty hearing?: No Does the patient have difficulty seeing, even when wearing glasses/contacts?: No Does the patient have difficulty concentrating, remembering, or making decisions?: No Does the patient have difficulty walking or climbing stairs?: Yes(dizziness sometimes) Does the patient have difficulty dressing or bathing?: No Does the patient have difficulty doing errands alone such as visiting a doctor's office or shopping?: No    Assessment & Plan  1. Type 2 diabetes mellitus with stage 2 chronic kidney disease, without long-term current use of insulin (HCC)  - POCT HgB A1C - Microalbumin / creatinine urine ratio - COMPLETE METABOLIC PANEL WITH  GFR - losartan (COZAAR) 100 MG tablet; Take 1 tablet (100 mg total) by mouth daily.  Dispense: 90 tablet; Refill: 1  2. Diabetic peripheral neuropathy associated with type 2 diabetes mellitus (HCC)  - gabapentin (NEURONTIN) 300 MG capsule; Take 2 capsules (600 mg total) by mouth at bedtime.  Dispense: 180 capsule; Refill: 1  3. Vitamin D deficiency  I will send rx vitamin D, not compliant with otc supplements   4. Insomnia, persistent  - QUEtiapine (SEROQUEL) 25 MG tablet; Take 1 tablet (25 mg total) by mouth at bedtime.  Dispense: 90 tablet; Refill: 1  5. Obstructive apnea  Not covered by insurance  6. Benign essential HTN  Slightly elevated  - COMPLETE METABOLIC PANEL WITH GFR - CBC with Differential/Platelet - losartan (COZAAR) 100 MG tablet; Take 1 tablet (100 mg total) by mouth daily.  Dispense: 90 tablet; Refill: 1  7. Benign hypertension with chronic kidney disease, stage II  On losartan   8. B12 deficiency  He needs to resume supplementation   9. Dyslipidemia associated with type 2 diabetes mellitus (HCC)  - Lipid panel - metFORMIN (GLUCOPHAGE) 1000 MG tablet; Take 1 tablet (1,000 mg total) by mouth 2 (two) times daily with a meal.  Dispense: 180 tablet; Refill: 1 - atorvastatin (LIPITOR) 40 MG tablet; Take 1 tablet (40 mg total) by mouth daily.  Dispense: 90 tablet; Refill: 1  10. Hypertension associated with type 2 diabetes mellitus (HCC)  Last BP was at goal, continue current regiment   Discussed colonoscopy again and he refused at this time

## 2019-08-09 ENCOUNTER — Telehealth: Payer: Self-pay

## 2019-08-09 DIAGNOSIS — D649 Anemia, unspecified: Secondary | ICD-10-CM

## 2019-08-09 NOTE — Telephone Encounter (Signed)
-----   Message from Benay Pike, CMA sent at 08/09/2019  9:38 AM EDT ----- Regarding: Add-on lab Can you show/tell me how to add-on? TIA ----- Message ----- From: Alba Cory, MD Sent: 08/08/2019   9:19 PM EDT To: Benay Pike, CMA  Dois Davenport,  Can you please add iron, tibc and ferritin to his labs? Dx anemia unspecified  Thank you  Hi Mr. Golubski,   Lipid panel is stable, continue medications Glucose is okay , kidney function stable - chronic kidney disease stage III, normal liver enzymes Mild anemia, I will add iron studies, it is very important for you to have colonoscopy done Take care,   Dr. Carlynn Purl

## 2019-08-10 LAB — TEST AUTHORIZATION

## 2019-08-10 LAB — CBC WITH DIFFERENTIAL/PLATELET
Absolute Monocytes: 539 cells/uL (ref 200–950)
Basophils Absolute: 49 cells/uL (ref 0–200)
Basophils Relative: 0.7 %
Eosinophils Absolute: 441 cells/uL (ref 15–500)
Eosinophils Relative: 6.3 %
HCT: 39.3 % (ref 38.5–50.0)
Hemoglobin: 13.1 g/dL — ABNORMAL LOW (ref 13.2–17.1)
Lymphs Abs: 1736 cells/uL (ref 850–3900)
MCH: 30.6 pg (ref 27.0–33.0)
MCHC: 33.3 g/dL (ref 32.0–36.0)
MCV: 91.8 fL (ref 80.0–100.0)
MPV: 9.9 fL (ref 7.5–12.5)
Monocytes Relative: 7.7 %
Neutro Abs: 4235 cells/uL (ref 1500–7800)
Neutrophils Relative %: 60.5 %
Platelets: 197 10*3/uL (ref 140–400)
RBC: 4.28 10*6/uL (ref 4.20–5.80)
RDW: 12.8 % (ref 11.0–15.0)
Total Lymphocyte: 24.8 %
WBC: 7 10*3/uL (ref 3.8–10.8)

## 2019-08-10 LAB — COMPLETE METABOLIC PANEL WITH GFR
AG Ratio: 2 (calc) (ref 1.0–2.5)
ALT: 26 U/L (ref 9–46)
AST: 19 U/L (ref 10–35)
Albumin: 4.5 g/dL (ref 3.6–5.1)
Alkaline phosphatase (APISO): 71 U/L (ref 35–144)
BUN/Creatinine Ratio: 18 (calc) (ref 6–22)
BUN: 22 mg/dL (ref 7–25)
CO2: 22 mmol/L (ref 20–32)
Calcium: 9.5 mg/dL (ref 8.6–10.3)
Chloride: 109 mmol/L (ref 98–110)
Creat: 1.22 mg/dL — ABNORMAL HIGH (ref 0.70–1.18)
GFR, Est African American: 69 mL/min/{1.73_m2} (ref >=60)
GFR, Est Non African American: 59 mL/min/{1.73_m2} — ABNORMAL LOW (ref >=60)
Globulin: 2.3 g/dL (calc) (ref 1.9–3.7)
Glucose, Bld: 148 mg/dL — ABNORMAL HIGH (ref 65–99)
Potassium: 4.4 mmol/L (ref 3.5–5.3)
Sodium: 141 mmol/L (ref 135–146)
Total Bilirubin: 1.3 mg/dL — ABNORMAL HIGH (ref 0.2–1.2)
Total Protein: 6.8 g/dL (ref 6.1–8.1)

## 2019-08-10 LAB — IRON,TIBC AND FERRITIN PANEL
%SAT: 34 % (calc) (ref 20–48)
Ferritin: 129 ng/mL (ref 24–380)
Iron: 106 ug/dL (ref 50–180)
TIBC: 314 mcg/dL (calc) (ref 250–425)

## 2019-08-10 LAB — LIPID PANEL
Cholesterol: 112 mg/dL (ref <200)
HDL: 32 mg/dL — ABNORMAL LOW (ref >=40)
LDL Cholesterol (Calc): 53 mg/dL (calc)
Non-HDL Cholesterol (Calc): 80 mg/dL (calc) (ref <130)
Total CHOL/HDL Ratio: 3.5 (calc) (ref <5.0)
Triglycerides: 202 mg/dL — ABNORMAL HIGH (ref <150)

## 2019-08-14 ENCOUNTER — Telehealth: Payer: Self-pay | Admitting: Family Medicine

## 2019-08-14 NOTE — Chronic Care Management (AMB) (Signed)
  Chronic Care Management   Outreach Note  08/14/2019 Name: Brent Carroll MRN: [id number] DOB: 12/15/1945  Brent Carroll is a 74 y.o. year old male who is a primary care patient of Alba Cory, MD. I reached out to Arta Silence by phone today in response to a referral sent by Brent Carroll health plan.     An unsuccessful telephone outreach was attempted today. The patient was referred to the case management team for assistance with care management and care coordination.   Follow Up Plan: A HIPPA compliant phone message was left for the patient providing contact information and requesting a return call. The care management team will reach out to the patient again over the next 7 days. If patient returns call to provider office, please advise to call Embedded Care Management Care Guide Gwenevere Ghazi at [phone].  Gwenevere Ghazi  Care Guide, Embedded Care Coordination [facility]  [place], [place] [address] Direct Dial: [phone] [email or web address] Website: [email or web address]

## 2019-08-20 NOTE — Chronic Care Management (AMB) (Signed)
  Chronic Care Management   Outreach Note  08/20/2019 Name: PURCELL JUNGBLUTH MRN: [id number] DOB: Dec 19, 1945  Earlene Plater Hoaglund is a 74 y.o. year old male who is a primary care patient of Alba Cory, MD. I reached out to Arta Silence by phone today in response to a referral sent by Mr. AN [employer]NA[employer] health plan.     A second unsuccessful telephone outreach was attempted today. The patient was referred to the case management team for assistance with care management and care coordination.   Follow Up Plan: A HIPPA compliant phone message was left for the patient providing contact information and requesting a return call. The care management team will reach out to the patient again over the next 7 days. If patient returns call to provider office, please advise to call Embedded Care Management Care Guide Gwenevere Ghazi at [phone].  Gwenevere Ghazi  Care Guide, Embedded Care Coordination [facility]  [place], [place] [address] Direct Dial: [phone] [email or web address] Website: [email or web address]

## 2019-08-28 NOTE — Chronic Care Management (AMB) (Signed)
  Chronic Care Management   Outreach Note  08/28/2019 Name: Brent Carroll MRN: [id number] DOB: November 26, 1945  [name] is a 74 y.o. year old male who is a primary care patient of Alba Cory, MD. I reached out to Arta Silence by phone today in response to a referral sent by Mr. [name] health plan.     Third unsuccessful telephone outreach was attempted today. The patient was referred to the case management team for assistance with care management and care coordination. The patient's primary care provider has been notified of our unsuccessful attempts to make or maintain contact with the patient. The care management team is pleased to engage with this patient at any time in the future should he/she be interested in assistance from the care management team.   Follow Up Plan: A HIPPA compliant phone message was left for the patient providing contact information and requesting a return call. The care management team is available to follow up with the patient after provider conversation with the patient regarding recommendation for care management engagement and subsequent re-referral to the care management team.  If patient returns call to provider office, please advise to call Embedded Care Management Care Guide Gwenevere Ghazi at [phone].  Gwenevere Ghazi  Care Guide, Embedded Care Coordination [facility]  [place], [place] [address] Direct Dial: [phone] [email or web address] Website: [email or web address]

## 2019-10-14 ENCOUNTER — Other Ambulatory Visit: Payer: Self-pay | Admitting: Family Medicine

## 2019-10-14 DIAGNOSIS — N182 Chronic kidney disease, stage 2 (mild): Secondary | ICD-10-CM

## 2019-10-14 DIAGNOSIS — I10 Essential (primary) hypertension: Secondary | ICD-10-CM

## 2019-10-14 DIAGNOSIS — E1142 Type 2 diabetes mellitus with diabetic polyneuropathy: Secondary | ICD-10-CM

## 2019-10-14 DIAGNOSIS — E1122 Type 2 diabetes mellitus with diabetic chronic kidney disease: Secondary | ICD-10-CM

## 2019-10-14 DIAGNOSIS — E785 Hyperlipidemia, unspecified: Secondary | ICD-10-CM

## 2019-10-14 DIAGNOSIS — E1169 Type 2 diabetes mellitus with other specified complication: Secondary | ICD-10-CM

## 2019-10-14 MED ORDER — LOSARTAN POTASSIUM 100 MG PO TABS: 100.0000 mg | ORAL_TABLET | Freq: Every day | ORAL | 0 refills | Status: DC

## 2019-10-14 MED ORDER — METFORMIN HCL 1000 MG PO TABS: 1000.0000 mg | ORAL_TABLET | Freq: Two times a day (BID) | ORAL | 0 refills | Status: DC

## 2019-10-14 MED ORDER — ATORVASTATIN CALCIUM 40 MG PO TABS: 40.0000 mg | ORAL_TABLET | Freq: Every day | ORAL | 0 refills | Status: DC

## 2019-10-14 MED ORDER — GABAPENTIN 300 MG PO CAPS: 600.0000 mg | ORAL_CAPSULE | Freq: Every day | ORAL | 0 refills | Status: DC

## 2019-10-14 MED ORDER — HYDROXYZINE HCL 25 MG PO TABS: ORAL_TABLET | ORAL | 0 refills | Status: DC

## 2019-10-14 MED ORDER — VITAMIN D (ERGOCALCIFEROL) 1.25 MG (50000 UNIT) PO CAPS: 50000.0000 [IU] | ORAL_CAPSULE | ORAL | 0 refills | Status: DC

## 2019-10-14 NOTE — Telephone Encounter (Signed)
Requested medication (s) are due for refill today- unsure  Requested medication (s) are on the active medication list- yes  Future visit scheduled -yes  Last refill: Rx OTC-written 4 years ago- outside provider  Notes to clinic: Patient is in process of pharmacy change- request medications to local pharmacy-remainder of RF transferred to local pharmacy- did transfer remaining RF of Rx vit D to pharmacy ( sorry delegated -but had RF) Rx in question is 74 yo OTC vitamin D- outside provider Rx  Requested Prescriptions  Pending Prescriptions Disp Refills   Cholecalciferol (VITAMIN D) 50 MCG (2000 UT) CAPS 30 capsule     Sig: Take 1 capsule (2,000 Units total) by mouth daily.      Endocrinology:  Vitamins - Vitamin D Supplementation Failed - 10/14/2019  4:26 PM      Failed - 50,000 IU strengths are not delegated      Failed - Phosphate in normal range and within 360 days    No results found for: PHOS        Failed - Vitamin D in normal range and within 360 days    Vit D, 25-Hydroxy  Date Value Ref Range Status  09/18/2018 16.0 (L) 30.0 - 100.0 ng/mL Final    Comment:    Vitamin D deficiency has been defined by the Institute of Medicine and an Endocrine Society practice guideline as a level of serum 25-OH vitamin D less than 20 ng/mL (1,2). The Endocrine Society went on to further define vitamin D insufficiency as a level between 21 and 29 ng/mL (2). 1. IOM (Institute of Medicine). 2010. Dietary reference    intakes for calcium and D. [place]: The    [employer]. 2. Holick MF, Binkley [place], Bischoff-Ferrari HA, et al.    Evaluation, treatment, and prevention of vitamin D    deficiency: an Endocrine Society clinical practice    guideline. JCEM. 2011 Jul; 96(7):1911-30.           Passed - Ca in normal range and within 360 days    Calcium  Date Value Ref Range Status  08/05/2019 9.5 8.6 - 10.3 mg/dL Final          Passed - Valid encounter within last 12 months     Recent Outpatient Visits           2 months ago Type 2 diabetes mellitus with stage 2 chronic kidney disease, without long-term current use of insulin [facility])   [facility] [place], Drue Stager, MD   6 months ago Type 2 diabetes mellitus with stage 2 chronic kidney disease, without long-term current use of insulin [facility])   [facility] Steele Sizer, MD   8 months ago Colon cancer screening   [facility] [place], Drue Stager, MD   10 months ago Dyslipidemia associated with type 2 diabetes mellitus [facility])   [facility] [place], Drue Stager, MD   1 year ago Dyslipidemia associated with type 2 diabetes mellitus [facility])   [facility] Steele Sizer, MD       Future Appointments             In 1 month Steele Sizer, MD [facility]             Signed Prescriptions Disp Refills   atorvastatin (LIPITOR) 40 MG tablet 90 tablet 0    Sig: Take 1 tablet (40 mg total) by mouth daily.      Cardiovascular:  Antilipid - Statins Failed - 10/14/2019  4:26 PM      Failed - HDL in normal range and within 360 days    HDL  Date Value Ref Range Status  08/05/2019 32 (L) > OR = 40 mg/dL Final  09/18/2018 29 (L) >39 mg/dL Final          Failed - Triglycerides in normal range and within 360 days    Triglycerides  Date Value Ref Range Status  08/05/2019 202 (H) <150 mg/dL Final    Comment:    . If a non-fasting specimen was collected, consider repeat triglyceride testing on a fasting specimen if clinically indicated.  Yates Decamp et al. J. of Clin. Lipidol. 3419;3:790-240. Marland Kitchen           Passed - Total Cholesterol in normal range and within 360 days    Cholesterol, Total  Date Value Ref Range Status  09/18/2018 142 100 - 199 mg/dL Final   Cholesterol  Date Value Ref Range Status  08/05/2019 112 <200 mg/dL Final          Passed - LDL in normal range and within 360 days     LDL Cholesterol (Calc)  Date Value Ref Range Status  08/05/2019 53 mg/dL (calc) Final    Comment:    Reference range: <100 . Desirable range <100 mg/dL for primary prevention;   <70 mg/dL for patients with CHD or diabetic patients  with > or = 2 CHD risk factors. Marland Kitchen LDL-C is now calculated using the Martin-Hopkins  calculation, which is a validated novel method providing  better accuracy than the Friedewald equation in the  estimation of LDL-C.  Cresenciano Genre et al. Annamaria Helling. 9735;329(92): 2061-2068  (http://education.[facility].com/faq/FAQ164)           Passed - Patient is not pregnant      Passed - Valid encounter within last 12 months    Recent Outpatient Visits           2 months ago Type 2 diabetes mellitus with stage 2 chronic kidney disease, without long-term current use of insulin [facility])   [facility] [place], Drue Stager, MD   6 months ago Type 2 diabetes mellitus with stage 2 chronic kidney disease, without long-term current use of insulin [facility])   [facility]izer, MD   8 months ago Colon cancer screening   [facility] [place], Drue Stager, MD   10 months ago Dyslipidemia associated with type 2 diabetes mellitus [facility])   [facility] [place], Drue Stager, MD   1 year ago Dyslipidemia associated with type 2 diabetes mellitus [facility])   [facility] Steele Sizer, MD       Future Appointments             In 1 month Steele Sizer, MD [facility]              gabapentin (NEURONTIN) 300 MG capsule 180 capsule 0    Sig: Take 2 capsules (600 mg total) by mouth at bedtime.      Neurology: Anticonvulsants - gabapentin Passed - 10/14/2019  4:26 PM      Passed - Valid encounter within last 12 months    Recent Outpatient Visits           2 months ago Type 2 diabetes mellitus with stage 2 chronic kidney disease, without long-term current use of  insulin [facility])   [facility] Sizer, MD  6 months ago Type 2 diabetes mellitus with stage 2 chronic kidney disease, without long-term current use of insulin [facility])   [facility] Sizer, MD   8 months ago Colon cancer screening   [facility] [place], Drue Stager, MD   10 months ago Dyslipidemia associated with type 2 diabetes mellitus [facility])   [facility] [place], Drue Stager, MD   1 year ago Dyslipidemia associated with type 2 diabetes mellitus [facility])   [facility] Steele Sizer, MD       Future Appointments             In 1 month Steele Sizer, MD [facility]              hydrOXYzine (ATARAX/VISTARIL) 25 MG tablet 90 tablet 0    Sig: Take 1 tablet (25 mg total) by mouth at bedtime.      Ear, Nose, and Throat:  Antihistamines Passed - 10/14/2019  4:26 PM      Passed - Valid encounter within last 12 months    Recent Outpatient Visits           2 months ago Type 2 diabetes mellitus with stage 2 chronic kidney disease, without long-term current use of insulin [facility])   [facility] [place], Drue Stager, MD   6 months ago Type 2 diabetes mellitus with stage 2 chronic kidney disease, without long-term current use of insulin [facility])   [facility] Sizer, MD   8 months ago Colon cancer screening   [facility] [place], Drue Stager, MD   10 months ago Dyslipidemia associated with type 2 diabetes mellitus [facility])   [facility] [place], Drue Stager, MD   1 year ago Dyslipidemia associated with type 2 diabetes mellitus [facility])   [facility] Steele Sizer, MD       Future Appointments             In 1 month Steele Sizer, MD [facility], [facility]              losartan (COZAAR) 100 MG tablet 90 tablet 0    Sig: Take 1 tablet (100 mg total) by  mouth daily.      Cardiovascular:  Angiotensin Receptor Blockers Failed - 10/14/2019  4:26 PM      Failed - Cr in normal range and within 180 days    Creat  Date Value Ref Range Status  08/05/2019 1.22 (H) 0.70 - 1.18 mg/dL Final    Comment:    For patients >33 years of age, the reference limit for Creatinine is approximately 13% higher for people identified as African-American. .           Failed - Last BP in normal range    BP Readings from Last 1 Encounters:  08/05/19 (!) 142/80          Passed - K in normal range and within 180 days    Potassium  Date Value Ref Range Status  08/05/2019 4.4 3.5 - 5.3 mmol/L Final          Passed - Patient is not pregnant      Passed - Valid encounter within last 6 months    Recent Outpatient Visits           2 months ago Type 2 diabetes mellitus with stage 2 chronic kidney disease, without long-term current use of insulin ([place])  [facility]izer, MD   6 months ago Type 2 diabetes mellitus with stage 2 chronic kidney disease, without long-term current use of insulin [facility])   [facility] [place], Drue Stager, MD   8 months ago Colon cancer screening   [facility] Sizer, MD   10 months ago Dyslipidemia associated with type 2 diabetes mellitus [facility])   [facility] Sizer, MD   1 year ago Dyslipidemia associated with type 2 diabetes mellitus [facility])   [facility]izer, MD       Future Appointments             In 1 month Ancil Boozer, Drue Stager, MD [facility], [facility]              metFORMIN (GLUCOPHAGE) 1000 MG tablet 180 tablet 0    Sig: Take 1 tablet (1,000 mg total) by mouth 2 (two) times daily with a meal.      Endocrinology:  Diabetes - Biguanides Failed - 10/14/2019  4:26 PM      Failed - Cr in normal range and within 360 days    Creat  Date Value Ref Range Status  08/05/2019  1.22 (H) 0.70 - 1.18 mg/dL Final    Comment:    For patients >37 years of age, the reference limit for Creatinine is approximately 13% higher for people identified as African-American. .           Passed - HBA1C is between 0 and 7.9 and within 180 days    Hemoglobin A1C  Date Value Ref Range Status  08/05/2019 6.7 (A) 4.0 - 5.6 % Final   HbA1c, POC (prediabetic range)  Date Value Ref Range Status  02/06/2018 6.6 (A) 5.7 - 6.4 % Final   HbA1c, POC (controlled diabetic range)  Date Value Ref Range Status  02/06/2018 6.6 0.0 - 7.0 % Final   HbA1c POC (<> result, manual entry)  Date Value Ref Range Status  02/06/2018 6.6 4.0 - 5.6 % Final   Hgb A1c MFr Bld  Date Value Ref Range Status  11/27/2018 7.4 (H) 4.8 - 5.6 % Final    Comment:             Prediabetes: 5.7 - 6.4          Diabetes: >6.4          Glycemic control for adults with diabetes: <7.0           Passed - eGFR in normal range and within 360 days    GFR, Est African American  Date Value Ref Range Status  08/05/2019 69 > OR = 60 mL/min/1.39m Final   GFR, Est Non African American  Date Value Ref Range Status  08/05/2019 59 (L) > OR = 60 mL/min/1.780mFinal          Passed - Valid encounter within last 6 months    Recent Outpatient Visits           2 months ago Type 2 diabetes mellitus with stage 2 chronic kidney disease, without long-term current use of insulin ([facility]   6 months ago Type 2 diabetes mellitus with stage 2 chronic kidney disease, without long-term current use of insulin ([facility]  [facility]   8 months ago Colon cancer screening   [facility]  10 months ago Dyslipidemia associated with type 2 diabetes mellitus [facility])   [facility] [place], Drue Stager, MD   1 year ago Dyslipidemia associated with type 2 diabetes mellitus [facility])   [facility] [place], Drue Stager, MD       Future Appointments             In 1 month Steele Sizer, MD [facility], [facility]              Vitamin D, Ergocalciferol, (DRISDOL) 1.25 MG (50000 UNIT) CAPS capsule 12 capsule 0    Sig: Take 1 capsule (50,000 Units total) by mouth every 7 (seven) days.      Endocrinology:  Vitamins - Vitamin D Supplementation Failed - 10/14/2019  4:26 PM      Failed - 50,000 IU strengths are not delegated      Failed - Phosphate in normal range and within 360 days    No results found for: PHOS        Failed - Vitamin D in normal range and within 360 days    Vit D, 25-Hydroxy  Date Value Ref Range Status  09/18/2018 16.0 (L) 30.0 - 100.0 ng/mL Final    Comment:    Vitamin D deficiency has been defined by the Institute of Medicine and an Endocrine Society practice guideline as a level of serum 25-OH vitamin D less than 20 ng/mL (1,2). The Endocrine Society went on to further define vitamin D insufficiency as a level between 21 and 29 ng/mL (2). 1. IOM (Institute of Medicine). 2010. Dietary reference    intakes for calcium and D. [place]: The    [employer]. 2. Holick MF, Binkley [place], Bischoff-Ferrari HA, et al.    Evaluation, treatment, and prevention of vitamin D    deficiency: an Endocrine Society clinical practice    guideline. JCEM. 2011 Jul; 96(7):1911-30.           Passed - Ca in normal range and within 360 days    Calcium  Date Value Ref Range Status  08/05/2019 9.5 8.6 - 10.3 mg/dL Final          Passed - Valid encounter within last 12 months    Recent Outpatient Visits           2 months ago Type 2 diabetes mellitus with stage 2 chronic kidney disease, without long-term current use of insulin [facility])   [facility] [place], Drue Stager, MD   6 months ago Type 2 diabetes mellitus with stage 2 chronic kidney disease, without long-term current use of insulin [facility])   [facility] Steele Sizer, MD   8 months ago Colon cancer screening   [facility] [place], Drue Stager, MD   10 months ago Dyslipidemia associated with type 2 diabetes mellitus [facility])   [facility] [place], Drue Stager, MD   1 year ago Dyslipidemia associated with type 2 diabetes mellitus [facility])   [facility] Steele Sizer, MD       Future Appointments             In 1 month Steele Sizer, MD [facility]                Requested Prescriptions  Pending Prescriptions Disp Refills   Cholecalciferol (VITAMIN D) 50 MCG (2000 UT) CAPS 30 capsule     Sig: Take 1 capsule (2,000 Units total) by mouth daily.  Endocrinology:  Vitamins - Vitamin D Supplementation Failed - 10/14/2019  4:26 PM      Failed - 50,000 IU strengths are not delegated      Failed - Phosphate in normal range and within 360 days    No results found for: PHOS        Failed - Vitamin D in normal range and within 360 days    Vit D, 25-Hydroxy  Date Value Ref Range Status  09/18/2018 16.0 (L) 30.0 - 100.0 ng/mL Final    Comment:    Vitamin D deficiency has been defined by the Institute of Medicine and an Endocrine Society practice guideline as a level of serum 25-OH vitamin D less than 20 ng/mL (1,2). The Endocrine Society went on to further define vitamin D insufficiency as a level between 21 and 29 ng/mL (2). 1. IOM (Institute of Medicine). 2010. Dietary reference    intakes for calcium and D. [place]: The    [employer]. 2. Holick MF, Binkley [place], Bischoff-Ferrari HA, et al.    Evaluation, treatment, and prevention of vitamin D    deficiency: an Endocrine Society clinical practice    guideline. JCEM. 2011 Jul; 96(7):1911-30.           Passed - Ca in normal range and within 360 days    Calcium  Date Value Ref Range Status  08/05/2019 9.5 8.6 - 10.3 mg/dL Final          Passed -  Valid encounter within last 12 months    Recent Outpatient Visits           2 months ago Type 2 diabetes mellitus with stage 2 chronic kidney disease, without long-term current use of insulin [facility])   [facility] [place], Drue Stager, MD   6 months ago Type 2 diabetes mellitus with stage 2 chronic kidney disease, without long-term current use of insulin [facility])   [facility] Sizer, MD   8 months ago Colon cancer screening   [facility] [place], Drue Stager, MD   10 months ago Dyslipidemia associated with type 2 diabetes mellitus [facility])   [facility] [place], Drue Stager, MD   1 year ago Dyslipidemia associated with type 2 diabetes mellitus [facility])   [facility] Steele Sizer, MD       Future Appointments             In 1 month Steele Sizer, MD [facility], [facility]             Signed Prescriptions Disp Refills   atorvastatin (LIPITOR) 40 MG tablet 90 tablet 0    Sig: Take 1 tablet (40 mg total) by mouth daily.      Cardiovascular:  Antilipid - Statins Failed - 10/14/2019  4:26 PM      Failed - HDL in normal range and within 360 days    HDL  Date Value Ref Range Status  08/05/2019 32 (L) > OR = 40 mg/dL Final  09/18/2018 29 (L) >39 mg/dL Final          Failed - Triglycerides in normal range and within 360 days    Triglycerides  Date Value Ref Range Status  08/05/2019 202 (H) <150 mg/dL Final    Comment:    . If a non-fasting specimen was collected, consider repeat triglyceride testing on a fasting specimen if clinically indicated.  Yates Decamp et al. J. of Clin. Lipidol. 7116;5:790-383. Marland Kitchen  Passed - Total Cholesterol in normal range and within 360 days    Cholesterol, Total  Date Value Ref Range Status  09/18/2018 142 100 - 199 mg/dL Final   Cholesterol  Date Value Ref Range Status  08/05/2019 112 <200 mg/dL Final          Passed - LDL in  normal range and within 360 days    LDL Cholesterol (Calc)  Date Value Ref Range Status  08/05/2019 53 mg/dL (calc) Final    Comment:    Reference range: <100 . Desirable range <100 mg/dL for primary prevention;   <70 mg/dL for patients with CHD or diabetic patients  with > or = 2 CHD risk factors. Marland Kitchen LDL-C is now calculated using the Martin-Hopkins  calculation, which is a validated novel method providing  better accuracy than the Friedewald equation in the  estimation of LDL-C.  Cresenciano Genre et al. Annamaria Helling. 0034;917(91): 2061-2068  (http://education.[facility].com/faq/FAQ164)           Passed - Patient is not pregnant      Passed - Valid encounter within last 12 months    Recent Outpatient Visits           2 months ago Type 2 diabetes mellitus with stage 2 chronic kidney disease, without long-term current use of insulin [facility])   [facility] [place], Drue Stager, MD   6 months ago Type 2 diabetes mellitus with stage 2 chronic kidney disease, without long-term current use of insulin [facility])   [facility] Steele Sizer, MD   8 months ago Colon cancer screening   [facility] [place], Drue Stager, MD   10 months ago Dyslipidemia associated with type 2 diabetes mellitus [facility])   [facility] [place], Drue Stager, MD   1 year ago Dyslipidemia associated with type 2 diabetes mellitus [facility])   [facility] Steele Sizer, MD       Future Appointments             In 1 month Steele Sizer, MD [facility]              gabapentin (NEURONTIN) 300 MG capsule 180 capsule 0    Sig: Take 2 capsules (600 mg total) by mouth at bedtime.      Neurology: Anticonvulsants - gabapentin Passed - 10/14/2019  4:26 PM      Passed - Valid encounter within last 12 months    Recent Outpatient Visits           2 months ago Type 2 diabetes mellitus with stage 2 chronic kidney disease,  without long-term current use of insulin [facility])   [facility] [place], Drue Stager, MD   6 months ago Type 2 diabetes mellitus with stage 2 chronic kidney disease, without long-term current use of insulin [facility])   [facility] Steele Sizer, MD   8 months ago Colon cancer screening   [facility] [place], Drue Stager, MD   10 months ago Dyslipidemia associated with type 2 diabetes mellitus [facility])   [facility] Steele Sizer, MD   1 year ago Dyslipidemia associated with type 2 diabetes mellitus [facility])   [facility] Steele Sizer, MD       Future Appointments             In 1 month Ancil Boozer, Drue Stager, MD [place]  hydrOXYzine (ATARAX/VISTARIL) 25 MG tablet 90 tablet 0    Sig: Take 1 tablet (25 mg total) by mouth at bedtime.      Ear, Nose, and Throat:  Antihistamines Passed - 10/14/2019  4:26 PM      Passed - Valid encounter within last 12 months    Recent Outpatient Visits           2 months ago Type 2 diabetes mellitus with stage 2 chronic kidney disease, without long-term current use of insulin [facility])   [facility] [place], Drue Stager, MD   6 months ago Type 2 diabetes mellitus with stage 2 chronic kidney disease, without long-term current use of insulin [facility])   [facility] Steele Sizer, MD   8 months ago Colon cancer screening   [facility] [place], Drue Stager, MD   10 months ago Dyslipidemia associated with type 2 diabetes mellitus [facility])   [facility] [place], Drue Stager, MD   1 year ago Dyslipidemia associated with type 2 diabetes mellitus [facility])   [facility] Steele Sizer, MD       Future Appointments             In 1 month Steele Sizer, MD [facility], [facility]              losartan (COZAAR) 100 MG tablet 90 tablet 0     Sig: Take 1 tablet (100 mg total) by mouth daily.      Cardiovascular:  Angiotensin Receptor Blockers Failed - 10/14/2019  4:26 PM      Failed - Cr in normal range and within 180 days    Creat  Date Value Ref Range Status  08/05/2019 1.22 (H) 0.70 - 1.18 mg/dL Final    Comment:    For patients >69 years of age, the reference limit for Creatinine is approximately 13% higher for people identified as African-American. .           Failed - Last BP in normal range    BP Readings from Last 1 Encounters:  08/05/19 (!) 142/80          Passed - K in normal range and within 180 days    Potassium  Date Value Ref Range Status  08/05/2019 4.4 3.5 - 5.3 mmol/L Final          Passed - Patient is not pregnant      Passed - Valid encounter within last 6 months    Recent Outpatient Visits           2 months ago Type 2 diabetes mellitus with stage 2 chronic kidney disease, without long-term current use of insulin [facility])   [facility] [place], Drue Stager, MD   6 months ago Type 2 diabetes mellitus with stage 2 chronic kidney disease, without long-term current use of insulin [facility])   [facility] Steele Sizer, MD   8 months ago Colon cancer screening   [facility] [place], Drue Stager, MD   10 months ago Dyslipidemia associated with type 2 diabetes mellitus [facility])   [facility] Steele Sizer, MD   1 year ago Dyslipidemia associated with type 2 diabetes mellitus [facility])   [facility] Steele Sizer, MD       Future Appointments             In 1 month Ancil Boozer, Drue Stager, MD [facility]  metFORMIN (GLUCOPHAGE) 1000 MG tablet 180 tablet 0    Sig: Take 1 tablet (1,000 mg total) by mouth 2 (two) times daily with a meal.      Endocrinology:  Diabetes - Biguanides Failed - 10/14/2019  4:26 PM      Failed - Cr in normal range and within 360 days    Creat  Date  Value Ref Range Status  08/05/2019 1.22 (H) 0.70 - 1.18 mg/dL Final    Comment:    For patients >35 years of age, the reference limit for Creatinine is approximately 13% higher for people identified as African-American. .           Passed - HBA1C is between 0 and 7.9 and within 180 days    Hemoglobin A1C  Date Value Ref Range Status  08/05/2019 6.7 (A) 4.0 - 5.6 % Final   HbA1c, POC (prediabetic range)  Date Value Ref Range Status  02/06/2018 6.6 (A) 5.7 - 6.4 % Final   HbA1c, POC (controlled diabetic range)  Date Value Ref Range Status  02/06/2018 6.6 0.0 - 7.0 % Final   HbA1c POC (<> result, manual entry)  Date Value Ref Range Status  02/06/2018 6.6 4.0 - 5.6 % Final   Hgb A1c MFr Bld  Date Value Ref Range Status  11/27/2018 7.4 (H) 4.8 - 5.6 % Final    Comment:             Prediabetes: 5.7 - 6.4          Diabetes: >6.4          Glycemic control for adults with diabetes: <7.0           Passed - eGFR in normal range and within 360 days    GFR, Est African American  Date Value Ref Range Status  08/05/2019 69 > OR = 60 mL/min/1.59m Final   GFR, Est Non African American  Date Value Ref Range Status  08/05/2019 59 (L) > OR = 60 mL/min/1.742mFinal          Passed - Valid encounter within last 6 months    Recent Outpatient Visits           2 months ago Type 2 diabetes mellitus with stage 2 chronic kidney disease, without long-term current use of insulin ([place]  [facility]   6 months ago Type 2 diabetes mellitus with stage 2 chronic kidney disease, without long-term current use of insulin ([facility]   8 months ago Colon cancer screening   [facility]   10 months ago Dyslipidemia associated with type 2 diabetes mellitus ([facility]   1 year ago Dyslipidemia associated with type 2  diabetes mellitus ([facility]  [facility] Stager[facility]       Future Appointments             In 1 month SoSteele SizerMD [facility]              Vitamin D, Ergocalciferol, (DRISDOL) 1.25 MG (50000 UNIT) CAPS capsule 12 capsule 0    Sig: Take 1 capsule (50,000 Units total) by mouth every 7 (seven) days.      Endocrinology:  Vitamins - Vitamin D Supplementation Failed - 10/14/2019  4:26 PM      Failed - 50,000 IU  strengths are not delegated      Failed - Phosphate in normal range and within 360 days    No results found for: PHOS        Failed - Vitamin D in normal range and within 360 days    Vit D, 25-Hydroxy  Date Value Ref Range Status  09/18/2018 16.0 (L) 30.0 - 100.0 ng/mL Final    Comment:    Vitamin D deficiency has been defined by the [place] practice guideline as a level of serum 25-OH vitamin D less than 20 ng/mL (1,2). The Endocrine Society went on to further define vitamin D insufficiency as a level between 21 and 29 ng/mL (2). 1. IOM (Institute of Medicine). 2010. Dietary reference    intakes for calcium and D. [place]: The    [employer]. 2. Holick MF, Binkley [place], Bischoff-Ferrari HA, et al.    Evaluation, treatment, and prevention of vitamin D    deficiency: an Endocrine Society clinical practice    guideline. JCEM. 2011 Jul; 96(7):1911-30.           Passed - Ca in normal range and within 360 days    Calcium  Date Value Ref Range Status  08/05/2019 9.5 8.6 - 10.3 mg/dL Final          Passed - Valid encounter within last 12 months    Recent Outpatient Visits           2 months ago Type 2 diabetes mellitus with stage 2 chronic kidney disease, without long-term current use of insulin [facility])   [facility] [place], Drue Stager, MD   6 months ago Type 2 diabetes mellitus with stage 2 chronic kidney disease, without long-term  current use of insulin [facility])   [facility] Steele Sizer, MD   8 months ago Colon cancer screening   [facility] Mar[facility] Colan, Drue Stager, MD   10 months ago Dyslipidemia associated with type 2 diabetes mellitus [facility])   [facility] Steele Sizer, MD   1 year ago Dyslipidemia associated with type 2 diabetes mellitus [facility])   [facility] Steele Sizer, MD       Future Appointments             In 1 month Ancil Boozer, Drue Stager, MD [place]

## 2019-10-14 NOTE — Telephone Encounter (Signed)
atorvastatin (LIPITOR) 40 MG tablet Medication Date: 08/05/2019 Department: [facility] Ordering/Authorizing: Alba Cory, MD  Order Providers  Cholecalciferol (VITAMIN D) 2000 units CAPS Medication Date: 08/14/2015 Department: [facility] Ordering/Authorizing: Schuyler Amor, MD   gabapentin (NEURONTIN) 300 MG capsule Medication Date: 08/05/2019 Department: [facility] Ordering/Authorizing: Alba Cory, MD   hydrOXYzine (ATARAX/VISTARIL) 25 MG tablet Medication Date: 08/05/2019 Department: [facility] Ordering/Authorizing: Alba Cory, MD  Order Providers  losartan (COZAAR) 100 MG tablet Medication Date: 08/05/2019 Department: [facility] Ordering/Authorizing: Alba Cory, MD  Order Providers  metFORMIN (GLUCOPHAGE) 1000 MG tablet Medication Date: 08/05/2019 Department: [facility] Ordering/Authorizing: Alba Cory, MD  Order Providers  Vitamin D, Ergocalciferol, (DRISDOL) 1.25 MG (50000 UNIT) CAPS capsule Medication Date: 08/05/2019 Department: [facility] Ordering/Authorizing: Alba Cory, MD   [id number] - [place], [place] - [address] AT [facility] [address] Phone:  [phone]  Fax:  [phone]     PT states that he is confused on who took over his mail order pharmacy and wants until he can get it straighen out refill on all these meds and sent to [facility]. States he is out of them all as he had put in an order with [facility] 10 days ago and was not processed as they have changed companies per pt.

## 2019-10-14 NOTE — Telephone Encounter (Signed)
Patient is going to have to change mail order and in the meantime would like Rx sent to local pharmacy- remainder of Rx sent to local pharmacy as requested.

## 2019-10-15 MED ORDER — VITAMIN D 50 MCG (2000 UT) PO CAPS: 1.0000 | ORAL_CAPSULE | Freq: Every day | ORAL | Status: DC

## 2019-11-05 ENCOUNTER — Other Ambulatory Visit: Payer: Self-pay | Admitting: Family Medicine

## 2019-11-05 DIAGNOSIS — G4700 Insomnia, unspecified: Secondary | ICD-10-CM

## 2019-11-05 MED ORDER — QUETIAPINE FUMARATE 25 MG PO TABS: 25.0000 mg | ORAL_TABLET | Freq: Every day | ORAL | 1 refills | Status: DC

## 2019-11-05 NOTE — Telephone Encounter (Signed)
Medication Refill - Medication: QUEtiapine (SEROQUEL) 25 MG tablet (Patient is completely out of medication and would like request expedited.)   Has the patient contacted their pharmacy? yes (Agent: If no, request that the patient contact the pharmacy for the refill.) (Agent: If yes, when and what did the pharmacy advise?)Contact PCP  Preferred Pharmacy (with phone number or street name):  [id number] - [place], [place] - [address] AT [facility] [address] Phone:  [phone]  Fax:  [phone]       Agent: Please be advised that RX refills may take up to 3 business days. We ask that you follow-up with your pharmacy.

## 2019-11-05 NOTE — Telephone Encounter (Signed)
Requested medication (s) are due for refill today: Yes  Requested medication (s) are on the active medication list: Yes  Last refill:  08/05/19  Future visit scheduled: Yes  Notes to clinic:  Unable to refill, cannot delegate     Requested Prescriptions  Pending Prescriptions Disp Refills   QUEtiapine (SEROQUEL) 25 MG tablet 90 tablet 1    Sig: Take 1 tablet (25 mg total) by mouth at bedtime.      Not Delegated - Psychiatry:  Antipsychotics - Second Generation (Atypical) - quetiapine Failed - 11/05/2019 11:14 AM      Failed - This refill cannot be delegated      Failed - Last BP in normal range    BP Readings from Last 1 Encounters:  08/05/19 (!) 142/80          Passed - ALT in normal range and within 180 days    ALT  Date Value Ref Range Status  08/05/2019 26 9 - 46 U/L Final          Passed - AST in normal range and within 180 days    AST  Date Value Ref Range Status  08/05/2019 19 10 - 35 U/L Final          Passed - Valid encounter within last 6 months    Recent Outpatient Visits           3 months ago Type 2 diabetes mellitus with stage 2 chronic kidney disease, without long-term current use of insulin (HCC)   [facility] [place], Danna Hefty, MD   7 months ago Type 2 diabetes mellitus with stage 2 chronic kidney disease, without long-term current use of insulin [facility])   [facility] Alba Cory, MD   9 months ago Colon cancer screening   [facility] [place], Danna Hefty, MD   11 months ago Dyslipidemia associated with type 2 diabetes mellitus [facility])   [facility] [place], Danna Hefty, MD   1 year ago Dyslipidemia associated with type 2 diabetes mellitus [facility])   [facility] Alba Cory, MD       Future Appointments             In 1 month Carlynn Purl, Danna Hefty, MD [facility], [facility]

## 2019-11-05 NOTE — Telephone Encounter (Signed)
Pt called about the refill request and stated he is out and needs the refill for Seroquel tonight/ please advise

## 2019-12-09 NOTE — Progress Notes (Signed)
Name: Brent Carroll   MRN: [id number]    DOB: 10-08-1945   Date:12/10/2019       Progress Note  Subjective  Chief Complaint  Chief Complaint  Patient presents with  . Follow-up  . Diabetes    HPI  DMII: A1C went up from 6.7 % to 6.9%, he states no changes in his life style.He has been compliant with medication and drinking more water. they are cutting down on fast food for lunch, but is eating biscuits for breakfast . He denies polyphagia , polyuria or polydipsia. DM with CKI on ARB, he states pain on legs has been stable on gabapentin, has dyslipidemia and is only on crestor, cannot afford lovaza    HTN: he is taking medication as prescribed, no chest pain or palpitation. BP today is at goal. He does not checking bp at home. He has very mild lower extremity edema  Hyperlipidemia: he isoff Lovaza because only have medicare and medication is too expensive,discussed goodrx but he states wife retired and they are living on a lower income monthly  Continue Crestor for now, last LDL was at goal at 53 June 2021. Triglycerides was 202   OSA: he states Dr. Brett AlbinoMorriseyorderedhis sleep study, he was unable to tolerate CPAP machine, he also states too expensive to go back for a repeat study.   GERD: he has occasional heartburn symptoms and takes Tums very seldom, about once a month   Insomnia:he is off Ambien CR, he is taking Seroquel and gabapentin, he watches TV at nightuntil 11 pm, but trying to fall asleep before midnight now.He states only has difficulty falling asleep but staying asleep for at least8 hours. Continue medications. Denies side effects. He gets up to void occasionally and has difficulty falling back asleep, but that is much less often now, not even once a month now   Morbid Obesity: he has multiple co-morbidities and BMI above 35 ,weight has been stable , discussed importance of weight los. He states he is drinking more water and no longer eating out as often, he  gained 2 lbs since last visit, but has new dentures on the top since last week, he states had pain last week but is doing well now .   B12 and Vitamin D deficiency:reviewed labs , he has not been compliant with supplementation Unchanged   CKI stage III: explained he needs to avoid NSAID's, drink water and keep diabetes and bp under control Last levels also showed anemia of chronic disease   Patient Active Problem List   Diagnosis Date Noted  . Diabetic neuropathy (HCC) 01/05/2017  . B12 deficiency 07/08/2016  . Dyslipidemia associated with type 2 diabetes mellitus (HCC) 11/12/2015  . 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 Vitamin D deficiency 08/14/2015  . CN (constipation) 01/07/2015  . Hyperlipidemia 01/07/2015  . Chronic kidney disease, stage II (mild) 08/25/2014  . Narrowing of intervertebral disc space 08/25/2014  . Type 2 diabetes mellitus with other diabetic kidney complication (HCC) 08/25/2014  . Failure of erection 08/25/2014  . Acid reflux 08/25/2014  . 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 Obstructive apnea 08/25/2014  . Benign essential HTN 03/16/2009  . Insomnia, persistent 02/10/2009    Past Surgical History:  Procedure Laterality Date  . HERNIA REPAIR    . HERNIA REPAIR Bilateral    inguinal   . TONSILLECTOMY      Family History  Problem Relation Age of Onset  . Heart disease Father   . COPD  Father     Social History   Tobacco Use  . Smoking status: Former Smoker    Quit date: 04/04/1986    Years since quitting: 33.7  . Smokeless tobacco: Never Used  Substance Use Topics  . Alcohol use: No    Alcohol/week: 0.0 standard drinks     Current Outpatient Medications:  .  acetaminophen (TYLENOL) 500 MG tablet, Take 1 tablet (500 mg total) by mouth every 6 (six) hours as needed., Disp: 60 tablet, Rfl: 0 .  atorvastatin (LIPITOR) 40 MG tablet, Take 1 tablet (40 mg total) by mouth daily., Disp: 90 tablet, Rfl: 0 .   Cyanocobalamin (B-12) 1000 MCG SUBL, Place 1 mcg under the tongue daily., Disp: 90 each, Rfl: 1 .  gabapentin (NEURONTIN) 300 MG capsule, Take 2 capsules (600 mg total) by mouth at bedtime., Disp: 180 capsule, Rfl: 0 .  hydrOXYzine (ATARAX/VISTARIL) 25 MG tablet, Take 1 tablet (25 mg total) by mouth at bedtime., Disp: 90 tablet, Rfl: 0 .  losartan (COZAAR) 100 MG tablet, Take 1 tablet (100 mg total) by mouth daily., Disp: 90 tablet, Rfl: 0 .  metFORMIN (GLUCOPHAGE) 1000 MG tablet, Take 1 tablet (1,000 mg total) by mouth 2 (two) times daily with a meal., Disp: 180 tablet, Rfl: 0 .  QUEtiapine (SEROQUEL) 25 MG tablet, Take 1 tablet (25 mg total) by mouth at bedtime., Disp: 90 tablet, Rfl: 1 .  Vitamin D, Ergocalciferol, (DRISDOL) 1.25 MG (50000 UNIT) CAPS capsule, Take 1 capsule (50,000 Units total) by mouth every 7 (seven) days., Disp: 12 capsule, Rfl: 0 .  Cholecalciferol (VITAMIN D) 50 MCG (2000 UT) CAPS, Take 1 capsule (2,000 Units total) by mouth daily. (Patient not taking: Reported on 12/10/2019), Disp: 30 capsule, Rfl:   Allergies  Allergen Reactions  . Trazodone And Nefazodone   . Trazodone Anxiety    I personally reviewed active problem list, medication list, allergies, family history, social history, health maintenance with the patient/caregiver today.   ROS  Constitutional: Negative for fever or weight change.  Respiratory: Negative for cough and shortness of breath.   Cardiovascular: Negative for chest pain or palpitations.  Gastrointestinal: Negative for abdominal pain, no bowel changes.  Musculoskeletal: positive for gait problem but no  joint swelling.  Skin: Negative for rash.  Neurological: Negative for dizziness or headache.  No other specific complaints in a complete review of systems (except as listed in HPI above).  Objective  Vitals:   12/10/19 1031  BP: 128/78  Pulse: 91  Resp: 18  Temp: 97.6 F (36.4 C)  TempSrc: Oral  SpO2: 97%  Weight: 245 lb 11.2 oz  (111.4 kg)  Height: 5\' 10"  (1.778 m)    Body mass index is 35.25 kg/m.  Physical Exam  Constitutional: Patient appears well-developed and well-nourished. Obese  No distress.  HEENT: head atraumatic, normocephalic, pupils equal and reactive to light,neck supple Cardiovascular: Normal rate, regular rhythm and normal heart sounds.  No murmur heard. No BLE edema. Pulmonary/Chest: Effort normal and breath sounds normal. No respiratory distress. Abdominal: Soft.  There is no tenderness. Psychiatric: Patient has a normal mood and affect. behavior is normal. Judgment and thought content normal.  Recent Results (from the past 2160 hour(s))  POCT HgB A1C     Status: Abnormal   Collection Time: 12/10/19 10:34 AM  Result Value Ref Range   Hemoglobin A1C 6.9 (A) 4.0 - 5.6 %   HbA1c POC (<> result, manual entry)     HbA1c, POC (prediabetic range)  HbA1c, POC (controlled diabetic range)       PHQ2/9: Depression screen [facility] 2/9 12/10/2019 08/05/2019 03/25/2019 01/23/2019 11/23/2018  Decreased Interest 0 0 0 0 0  Down, Depressed, Hopeless 0 0 0 0 0  PHQ - 2 Score 0 0 0 0 0  Altered sleeping - 0 0 0 0  Tired, decreased energy - 0 0 0 0  Change in appetite - 0 0 0 0  Feeling bad or failure about yourself  - 0 0 0 0  Trouble concentrating - 0 0 0 0  Moving slowly or fidgety/restless - 0 0 0 0  Suicidal thoughts - 0 0 0 0  PHQ-9 Score - 0 0 0 0  Difficult doing work/chores - Not difficult at all - - Not difficult at all  Some recent data might be hidden    phq 9 is negative   Fall Risk: Fall Risk  12/10/2019 08/05/2019 03/25/2019 01/23/2019 11/23/2018  Falls in the past year? 0 0 0 0 0  Number falls in past yr: 0 - 0 0 0  Comment - - - - -  Injury with Fall? 0 - 0 0 0  Comment - - - - -  Risk Factor Category  - - - - -  Risk for fall due to : - - - - -  Follow up - - - - -     Functional Status Survey: Is the patient deaf or have difficulty hearing?: No Does the patient have  difficulty seeing, even when wearing glasses/contacts?: No Does the patient have difficulty concentrating, remembering, or making decisions?: No Does the patient have difficulty walking or climbing stairs?: Yes Does the patient have difficulty dressing or bathing?: No Does the patient have difficulty doing errands alone such as visiting a doctor's office or shopping?: No    Assessment & Plan  1. Type 2 diabetes mellitus with stage 2 chronic kidney disease, without long-term current use of insulin (HCC)  - POCT HgB A1C - losartan (COZAAR) 100 MG tablet; Take 1 tablet (100 mg total) by mouth daily.  Dispense: 90 tablet; Refill: 1  2. Need for immunization against influenza  - Flu Vaccine QUAD High Dose(Fluad)  3. Stage 3a chronic kidney disease (HCC)  Advised to avoid nsaid's  4. Anemia of chronic disease  Reviewed with patient   5. Vitamin D deficiency  - Vitamin D, Ergocalciferol, (DRISDOL) 1.25 MG (50000 UNIT) CAPS capsule; Take 1 capsule (50,000 Units total) by mouth every 7 (seven) days.  Dispense: 12 capsule; Refill: 1  6. Benign essential HTN  - losartan (COZAAR) 100 MG tablet; Take 1 tablet (100 mg total) by mouth daily.  Dispense: 90 tablet; Refill: 1  7. Obstructive apnea   8. B12 deficiency  Continue supplementation   9. Diabetic peripheral neuropathy associated with type 2 diabetes mellitus (HCC)  - gabapentin (NEURONTIN) 300 MG capsule; Take 2 capsules (600 mg total) by mouth at bedtime.  Dispense: 180 capsule; Refill: 1  10. Insomnia, persistent  - hydrOXYzine (ATARAX/VISTARIL) 25 MG tablet; Take 1 tablet (25 mg total) by mouth at bedtime.  Dispense: 90 tablet; Refill: 1  11. Hypertension associated with type 2 diabetes mellitus (HCC)  bp is at goal   12. Dyslipidemia associated with type 2 diabetes mellitus (HCC)  - atorvastatin (LIPITOR) 40 MG tablet; Take 1 tablet (40 mg total) by mouth daily.  Dispense: 90 tablet; Refill: 1 - metFORMIN  (GLUCOPHAGE) 1000 MG tablet; Take 1 tablet (1,000 mg total) by  mouth 2 (two) times daily with a meal.  Dispense: 180 tablet; Refill: 1  13. Gastroesophageal reflux disease with esophagitis without hemorrhage  Controlled   14. Morbid obesity (HCC)  Discussed with the patient the risk posed by an increased BMI. Discussed importance of portion control, calorie counting and at least 150 minutes of physical activity weekly. Avoid sweet beverages and drink more water. Eat at least 6 servings of fruit and vegetables daily   15. Gait disturbance  Seen by ENT, diagnosed with equilibrium disorder years ago, symptoms are stable and intermittent, asked for medication but explained no medications for it, he was given Epley exercises to do at home, and he is doing it prn

## 2019-12-10 ENCOUNTER — Ambulatory Visit (INDEPENDENT_AMBULATORY_CARE_PROVIDER_SITE_OTHER): Payer: PPO | Admitting: Family Medicine

## 2019-12-10 ENCOUNTER — Encounter: Payer: Self-pay | Admitting: Family Medicine

## 2019-12-10 ENCOUNTER — Other Ambulatory Visit: Payer: Self-pay

## 2019-12-10 VITALS — BP 128/78 | HR 91 | Temp 97.6°F | Resp 18 | Ht 70.0 in | Wt 245.7 lb

## 2019-12-10 DIAGNOSIS — E559 Vitamin D deficiency, unspecified: Secondary | ICD-10-CM

## 2019-12-10 DIAGNOSIS — E538 Deficiency of other specified B group vitamins: Secondary | ICD-10-CM | POA: Diagnosis not present

## 2019-12-10 DIAGNOSIS — E1122 Type 2 diabetes mellitus with diabetic chronic kidney disease: Secondary | ICD-10-CM | POA: Diagnosis not present

## 2019-12-10 DIAGNOSIS — N1831 Chronic kidney disease, stage 3a: Secondary | ICD-10-CM | POA: Diagnosis not present

## 2019-12-10 DIAGNOSIS — G4700 Insomnia, unspecified: Secondary | ICD-10-CM | POA: Diagnosis not present

## 2019-12-10 DIAGNOSIS — D638 Anemia in other chronic diseases classified elsewhere: Secondary | ICD-10-CM

## 2019-12-10 DIAGNOSIS — E1142 Type 2 diabetes mellitus with diabetic polyneuropathy: Secondary | ICD-10-CM

## 2019-12-10 DIAGNOSIS — Z23 Encounter for immunization: Secondary | ICD-10-CM | POA: Diagnosis not present

## 2019-12-10 DIAGNOSIS — N182 Chronic kidney disease, stage 2 (mild): Secondary | ICD-10-CM | POA: Diagnosis not present

## 2019-12-10 DIAGNOSIS — G4733 Obstructive sleep apnea (adult) (pediatric): Secondary | ICD-10-CM | POA: Diagnosis not present

## 2019-12-10 DIAGNOSIS — E785 Hyperlipidemia, unspecified: Secondary | ICD-10-CM

## 2019-12-10 DIAGNOSIS — E1169 Type 2 diabetes mellitus with other specified complication: Secondary | ICD-10-CM | POA: Diagnosis not present

## 2019-12-10 DIAGNOSIS — K2100 Gastro-esophageal reflux disease with esophagitis, without bleeding: Secondary | ICD-10-CM

## 2019-12-10 DIAGNOSIS — I10 Essential (primary) hypertension: Secondary | ICD-10-CM

## 2019-12-10 DIAGNOSIS — R269 Unspecified abnormalities of gait and mobility: Secondary | ICD-10-CM

## 2019-12-10 DIAGNOSIS — I152 Hypertension secondary to endocrine disorders: Secondary | ICD-10-CM

## 2019-12-10 DIAGNOSIS — E1159 Type 2 diabetes mellitus with other circulatory complications: Secondary | ICD-10-CM | POA: Diagnosis not present

## 2019-12-10 LAB — POCT GLYCOSYLATED HEMOGLOBIN (HGB A1C): Hemoglobin A1C: 6.9 % — AB (ref 4.0–5.6)

## 2019-12-10 MED ORDER — GABAPENTIN 300 MG PO CAPS: 600.0000 mg | ORAL_CAPSULE | Freq: Every day | ORAL | 1 refills | Status: DC

## 2019-12-10 MED ORDER — ATORVASTATIN CALCIUM 40 MG PO TABS: 40.0000 mg | ORAL_TABLET | Freq: Every day | ORAL | 1 refills | Status: DC

## 2019-12-10 MED ORDER — HYDROXYZINE HCL 25 MG PO TABS: ORAL_TABLET | ORAL | 1 refills | Status: DC

## 2019-12-10 MED ORDER — METFORMIN HCL 1000 MG PO TABS: 1000.0000 mg | ORAL_TABLET | Freq: Two times a day (BID) | ORAL | 1 refills | Status: DC

## 2019-12-10 MED ORDER — LOSARTAN POTASSIUM 100 MG PO TABS: 100.0000 mg | ORAL_TABLET | Freq: Every day | ORAL | 1 refills | Status: DC

## 2019-12-10 MED ORDER — VITAMIN D (ERGOCALCIFEROL) 1.25 MG (50000 UNIT) PO CAPS: 50000.0000 [IU] | ORAL_CAPSULE | ORAL | 1 refills | Status: DC

## 2019-12-20 ENCOUNTER — Telehealth: Payer: Self-pay | Admitting: *Deleted

## 2019-12-20 NOTE — Chronic Care Management (AMB) (Signed)
  Chronic Care Management   Outreach Note  12/20/2019 Name: Brent Carroll MRN: [id number] DOB: Feb 07, 1946  Brent Carroll is a 74 y.o. year old male who is a primary care patient of Alba Cory, MD. I reached out to Brent Carroll by phone today in response to a referral sent by Mr. [employer]AK[employer] health plan.     An unsuccessful telephone outreach was attempted today. The patient was referred to the case management team for assistance with care management and care coordination.   Follow Up Plan: A HIPAA compliant phone message was left for the patient providing contact information and requesting a return call. The care management team will reach out to the patient again over the next 7 days. If patient returns call to provider office, please advise to call Embedded Care Management Care Guide Gwenevere Ghazi at [phone].  Gwenevere Ghazi  Care Guide, Embedded Care Coordination [facility] Management

## 2019-12-27 NOTE — Chronic Care Management (AMB) (Signed)
  Chronic Care Management   Outreach Note  12/27/2019 Name: RANDELL TEARE MRN: [id number] DOB: February 15, 1946  Earlene Plater Erich is a 74 y.o. year old male who is a primary care patient of Alba Cory, MD. I reached out to Arta Silence by phone today in response to a referral sent by Mr. [employer] health plan.     A second unsuccessful telephone outreach was attempted today. The patient was referred to the case management team for assistance with care management and care coordination.   Follow Up Plan: A HIPAA compliant phone message was left for the patient providing contact information and requesting a return call. The care management team will reach out to the patient again over the next 7 days. If patient returns call to provider office, please advise to call Embedded Care Management Care Guide Gwenevere Ghazi at [phone].   Gwenevere Ghazi  Care Guide, Embedded Care Coordination [facility] Management

## 2020-01-02 NOTE — Chronic Care Management (AMB) (Signed)
  Chronic Care Management   Outreach Note  01/02/2020 Name: Brent Carroll MRN: [id number] DOB: 06/12/1945  Brent Carroll is a 74 y.o. year old male who is a primary care patient of Alba Cory, MD. I reached out to Brent Carroll by phone today in response to a referral sent by Brent Carroll health plan.     Third unsuccessful telephone outreach was attempted today. The patient was referred to the case management team for assistance with care management and care coordination. The patient's primary care provider has been notified of our unsuccessful attempts to make or maintain contact with the patient. The care management team is pleased to engage with this patient at any time in the future should he/she be interested in assistance from the care management team.   Follow Up Plan: A HIPAA compliant phone message was left for the patient providing contact information and requesting a return call. If patient returns call to provider office, please advise to call Embedded Care Management Care Guide Gwenevere Ghazi at [phone].  Gwenevere Ghazi  Care Guide, Embedded Care Coordination [facility] Management

## 2020-01-09 ENCOUNTER — Other Ambulatory Visit: Payer: Self-pay | Admitting: Family Medicine

## 2020-01-09 DIAGNOSIS — E559 Vitamin D deficiency, unspecified: Secondary | ICD-10-CM

## 2020-01-09 NOTE — Telephone Encounter (Signed)
Requested medication (s) are due for refill today: Yes  Requested medication (s) are on the active medication list: Yes  Last refill:  12/10/18  Future visit scheduled:Yes  Notes to clinic: Unable to refill per protocol, cannot delegate     Requested Prescriptions  Pending Prescriptions Disp Refills   Vitamin D, Ergocalciferol, (DRISDOL) 1.25 MG (50000 UNIT) CAPS capsule [Pharmacy Med Name: VITAMIN D2 50,000IU (ERGO) CAP RX] 12 capsule 1    Sig: TAKE 1 CAPSULE BY MOUTH EVERY 7 DAYS      Endocrinology:  Vitamins - Vitamin D Supplementation Failed - 01/09/2020 12:58 PM      Failed - 50,000 IU strengths are not delegated      Failed - Phosphate in normal range and within 360 days    No results found for: PHOS        Failed - Vitamin D in normal range and within 360 days    Vit D, 25-Hydroxy  Date Value Ref Range Status  09/18/2018 16.0 (L) 30.0 - 100.0 ng/mL Final    Comment:    Vitamin D deficiency has been defined by the Institute of Medicine and an Endocrine Society practice guideline as a level of serum 25-OH vitamin D less than 20 ng/mL (1,2). The Endocrine Society went on to further define vitamin D insufficiency as a level between 21 and 29 ng/mL (2). 1. IOM (Institute of Medicine). 2010. Dietary reference    intakes for calcium and D. [place]: The    [employer]. 2. Holick MF, Binkley George, Bischoff-Ferrari HA, et al.    Evaluation, treatment, and prevention of vitamin D    deficiency: an Endocrine Society clinical practice    guideline. JCEM. 2011 Jul; 96(7):1911-30.           Passed - Ca in normal range and within 360 days    Calcium  Date Value Ref Range Status  08/05/2019 9.5 8.6 - 10.3 mg/dL Final          Passed - Valid encounter within last 12 months    Recent Outpatient Visits           1 month ago Type 2 diabetes mellitus with stage 2 chronic kidney disease, without long-term current use of insulin [facility])   [facility] [place], Danna Hefty, MD   5 months ago Type 2 diabetes mellitus with stage 2 chronic kidney disease, without long-term current use of insulin [facility])   [facility] [place], Danna Hefty, MD   9 months ago Type 2 diabetes mellitus with stage 2 chronic kidney disease, without long-term current use of insulin [facility])   [facility] Alba Cory, MD   11 months ago Colon cancer screening   [facility] [place], Danna Hefty, MD   1 year ago Dyslipidemia associated with type 2 diabetes mellitus [facility])   [facility] Alba Cory, MD       Future Appointments             In 3 months Carlynn Purl, Danna Hefty, MD [facility], [facility]

## 2020-02-25 ENCOUNTER — Telehealth: Payer: Self-pay | Admitting: Family Medicine

## 2020-02-25 NOTE — Telephone Encounter (Signed)
Copied from CRM [id number]. Topic: Medicare AWV >> Feb 25, 2020  1:29 PM Claudette Laws R wrote: Reason for CRM:   Left message for patient to call back and schedule Medicare Annual Wellness Visit (AWV) in office.   If not able to come in office, please offer to do virtually.   No hx of AWV eligible as of 03/01/19 [facility]  Please schedule at anytime with [facility] Health Advisor.      40 Minutes appointment   Any questions, please call me at [phone]

## 2020-03-10 ENCOUNTER — Telehealth: Payer: Self-pay | Admitting: Family Medicine

## 2020-03-10 NOTE — Telephone Encounter (Signed)
Copied from CRM [id number]. Topic: Medicare AWV >> Mar 10, 2020 10:00 AM Claudette Laws R wrote: Reason for CRM:   Left message for patient to call back and schedule Medicare Annual Wellness Visit (AWV) in office.   If not able to come in office, please offer to do virtually.   No hx of AWV eligible as of 03/01/2019 awvi  Please schedule at anytime with [facility].      40 Minutes appointment   Any questions, please call me at [phone]

## 2020-04-11 ENCOUNTER — Other Ambulatory Visit: Payer: Self-pay | Admitting: Family Medicine

## 2020-04-11 DIAGNOSIS — E785 Hyperlipidemia, unspecified: Secondary | ICD-10-CM

## 2020-04-11 DIAGNOSIS — E1169 Type 2 diabetes mellitus with other specified complication: Secondary | ICD-10-CM

## 2020-04-13 NOTE — Progress Notes (Signed)
Name: Brent Carroll   MRN: [id number]    DOB: 03/07/1945   Date:04/14/2020       Progress Note  Subjective  Chief Complaint  Follow Up  HPI  DMII: A1C went up from 6.7 % to 6.9% and today is down to 6.4 % he decrease fast food intake down to 3 times a week. He has been compliant with medication and drinking more water. He denies polyphagia , polyuria or polydipsia. DM with CKI on ARB, he states pain on legs has been stable on gabapentin, has dyslipidemia and is only on crestor, cannot afford lovaza , last triglycerides was 200's   HTN: he is taking medication as prescribed, no chest pain or palpitation. BP today is at goal. He does not checking bp at home. He has very mild lower extremity edema but states diabetic socks has helped   Hyperlipidemia: Continue Crestor for now, last LDL was at goal at 53 June 2021. Triglycerides stable in the low 200's   OSA: he states Dr. Brett Albino sleep study, he was unable to tolerate CPAP machine, he also states too expensive to go back for a repeat study. Refused again today   GERD: he has occasional heartburn symptoms and takes Tums very seldom, about once a month   Insomnia:he is off Ambien CR, he is taking Seroquel and gabapentin, he watches TV at nightuntil 11 pm, but trying to fall asleep before midnight now.He states only has difficulty falling asleep but staying asleep for at least8 hours. Continue medications. Denies side effects. He gets up to void occasionally and has difficulty falling back asleep, but that is much less often now, not even once a month now   Morbid Obesity: he has multiple co-morbidities and BMI above 35 ,weight has been stable , discussed importance of weight los. He states he is drinking more water and no longer eating out as often, he gained 2 lbs since last visit, but has new dentures on the top since last week, he states had pain last week but is doing well now .   B12 and Vitamin D  deficiency:reviewed labs , he has been taking Vitamin D weekly now and occasionally taking B12   CKI stage III: explained he needs to avoid NSAID's, drink water and keep diabetes and bp under control Last levels also showed anemia of chronic disease, based on labs done 07/2019 GFR 59   Right sciatica: he states he woke up last week with pain on right buttocks radiating down posterior right leg, no bowel or bladder incontinence. Pain is 9/10. Explained steroids not a good option since he has diabetes, discussed nsaid's for a about 5 days and continue gabapentin   Patient Active Problem List   Diagnosis Date Noted  . Diabetic neuropathy (HCC) 01/05/2017  . B12 deficiency 07/08/2016  . Dyslipidemia associated with type 2 diabetes mellitus (HCC) 11/12/2015  . 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 Vitamin D deficiency 08/14/2015  . CN (constipation) 01/07/2015  . Hyperlipidemia 01/07/2015  . Chronic kidney disease, stage II (mild) 08/25/2014  . Narrowing of intervertebral disc space 08/25/2014  . Type 2 diabetes mellitus with other diabetic kidney complication (HCC) 08/25/2014  . Failure of erection 08/25/2014  . Acid reflux 08/25/2014  . 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 Obstructive apnea 08/25/2014  . Benign essential HTN 03/16/2009  . Insomnia, persistent 02/10/2009    Past Surgical History:  Procedure Laterality Date  . HERNIA REPAIR    . HERNIA  REPAIR Bilateral    inguinal   . TONSILLECTOMY      Family History  Problem Relation Age of Onset  . Heart disease Father   . COPD Father     Social History   Tobacco Use  . Smoking status: Former Smoker    Quit date: 04/04/1986    Years since quitting: 34.0  . Smokeless tobacco: Never Used  Substance Use Topics  . Alcohol use: No    Alcohol/week: 0.0 standard drinks     Current Outpatient Medications:  .  acetaminophen (TYLENOL) 500 MG tablet, Take 1 tablet (500 mg total) by mouth  every 6 (six) hours as needed., Disp: 60 tablet, Rfl: 0 .  atorvastatin (LIPITOR) 40 MG tablet, Take 1 tablet (40 mg total) by mouth daily., Disp: 90 tablet, Rfl: 1 .  Cyanocobalamin (B-12) 1000 MCG SUBL, Place 1 mcg under the tongue daily., Disp: 90 each, Rfl: 1 .  gabapentin (NEURONTIN) 300 MG capsule, Take 2 capsules (600 mg total) by mouth at bedtime., Disp: 180 capsule, Rfl: 1 .  hydrOXYzine (ATARAX/VISTARIL) 25 MG tablet, Take 1 tablet (25 mg total) by mouth at bedtime., Disp: 90 tablet, Rfl: 1 .  losartan (COZAAR) 100 MG tablet, Take 1 tablet (100 mg total) by mouth daily., Disp: 90 tablet, Rfl: 1 .  metFORMIN (GLUCOPHAGE) 1000 MG tablet, TAKE 1 TABLET(1000 MG) BY MOUTH TWICE DAILY WITH A MEAL, Disp: 180 tablet, Rfl: 0 .  QUEtiapine (SEROQUEL) 25 MG tablet, Take 1 tablet (25 mg total) by mouth at bedtime., Disp: 90 tablet, Rfl: 1 .  Vitamin D, Ergocalciferol, (DRISDOL) 1.25 MG (50000 UNIT) CAPS capsule, TAKE 1 CAPSULE BY MOUTH EVERY 7 DAYS, Disp: 12 capsule, Rfl: 1 .  Cholecalciferol (VITAMIN D) 50 MCG (2000 UT) CAPS, Take 1 capsule (2,000 Units total) by mouth daily. (Patient not taking: No sig reported), Disp: 30 capsule, Rfl:   Allergies  Allergen Reactions  . Trazodone And Nefazodone   . Trazodone Anxiety    I personally reviewed active problem list, medication list, allergies, family history, social history, health maintenance, notes from last encounter with the patient/caregiver today.   ROS  Constitutional: Negative for fever or weight change.  Respiratory: Negative for cough and shortness of breath.   Cardiovascular: Negative for chest pain or palpitations.  Gastrointestinal: Negative for abdominal pain, no bowel changes.  Musculoskeletal: Positive for gait problem but no joint swelling.  Skin: Negative for rash.  Neurological: Negative for dizziness or headache.  No other specific complaints in a complete review of systems (except as listed in HPI  above).  Objective  Vitals:   04/14/20 1043 04/14/20 1047  BP: (!) 148/88 136/84  Pulse: 72   Resp: 16   Temp: 97.7 F (36.5 C)   TempSrc: Oral   SpO2: 97%   Weight: 241 lb (109.3 kg)   Height: 5\' 10"  (1.778 m)     Body mass index is 34.58 kg/m.  Physical Exam  Constitutional: Patient appears well-developed and well-nourished. Obese No distress.  HEENT: head atraumatic, normocephalic, pupils equal and reactive to light, , neck supple Cardiovascular: Normal rate, regular rhythm and normal heart sounds.  No murmur heard. Trace BLE edema. Pulmonary/Chest: Effort normal and breath sounds normal. No respiratory distress. Abdominal: Soft.  There is no tenderness. Muscular skeletal: using a cane, radiculitis down right leg  Psychiatric: Patient has a normal mood and affect. behavior is normal. Judgment and thought content normal.  Recent Results (from the past 2160 hour(s))  POCT  HgB A1C     Status: Abnormal   Collection Time: 04/14/20 10:50 AM  Result Value Ref Range   Hemoglobin A1C 6.4 (A) 4.0 - 5.6 %   HbA1c POC (<> result, manual entry)     HbA1c, POC (prediabetic range)     HbA1c, POC (controlled diabetic range)        PHQ2/9: Depression screen [facility] 2/9 04/14/2020 12/10/2019 08/05/2019 03/25/2019 01/23/2019  Decreased Interest 0 0 0 0 0  Down, Depressed, Hopeless 0 0 0 0 0  PHQ - 2 Score 0 0 0 0 0  Altered sleeping - - 0 0 0  Tired, decreased energy - - 0 0 0  Change in appetite - - 0 0 0  Feeling bad or failure about yourself  - - 0 0 0  Trouble concentrating - - 0 0 0  Moving slowly or fidgety/restless - - 0 0 0  Suicidal thoughts - - 0 0 0  PHQ-9 Score - - 0 0 0  Difficult doing work/chores - - Not difficult at all - -  Some recent data might be hidden    phq 9 is negative   Fall Risk: Fall Risk  04/14/2020 12/10/2019 08/05/2019 03/25/2019 01/23/2019  Falls in the past year? 0 0 0 0 0  Number falls in past yr: 0 0 - 0 0  Comment - - - - -  Injury with Fall?  0 0 - 0 0  Comment - - - - -  Risk Factor Category  - - - - -  Risk for fall due to : - - - - -  Follow up - - - - -     Functional Status Survey: Is the patient deaf or have difficulty hearing?: No Does the patient have difficulty seeing, even when wearing glasses/contacts?: No Does the patient have difficulty concentrating, remembering, or making decisions?: No Does the patient have difficulty walking or climbing stairs?: No Does the patient have difficulty dressing or bathing?: No Does the patient have difficulty doing errands alone such as visiting a doctor's office or shopping?: No    Assessment & Plan  1. Type 2 diabetes mellitus with stage 3a chronic kidney disease, without long-term current use of insulin (HCC)  - HM Diabetes Foot Exam - POCT HgB A1C - POCT UA - Microalbumin  2. Vitamin D deficiency  On supplementation   3. Anemia of chronic disease  Stable, recheck next time  4. Obstructive apnea   Refused sleep study   5. B12 deficiency  Resume B12  6. Hypertension associated with type 2 diabetes mellitus (HCC)   7. Morbid obesity (HCC)  Discussed with the patient the risk posed by an increased BMI. Discussed importance of portion control, calorie counting and at least 150 minutes of physical activity weekly. Avoid sweet beverages and drink more water. Eat at least 6 servings of fruit and vegetables daily   8. Insomnia    9. Gastroesophageal reflux disease with esophagitis without hemorrhage   10. Stage 3a chronic kidney disease (HCC)   11. Benign essential HTN  At goal after rest   12. Diabetic peripheral neuropathy associated with type 2 diabetes mellitus (HCC)   13. Dyslipidemia associated with type 2 diabetes mellitus (HCC)   14. Gait disturbance  Using a cane   15. Sciatica of right side  - diclofenac (VOLTAREN) 75 MG EC tablet; Take 1 tablet (75 mg total) by mouth 2 (two) times daily. Take for 5 days and after that  prn only   Dispense: 30 tablet; Refill: 0

## 2020-04-14 ENCOUNTER — Other Ambulatory Visit: Payer: Self-pay

## 2020-04-14 ENCOUNTER — Encounter: Payer: Self-pay | Admitting: Family Medicine

## 2020-04-14 ENCOUNTER — Ambulatory Visit (INDEPENDENT_AMBULATORY_CARE_PROVIDER_SITE_OTHER): Payer: PPO | Admitting: Family Medicine

## 2020-04-14 VITALS — BP 136/84 | HR 72 | Temp 97.7°F | Resp 16 | Ht 70.0 in | Wt 241.0 lb

## 2020-04-14 DIAGNOSIS — E1142 Type 2 diabetes mellitus with diabetic polyneuropathy: Secondary | ICD-10-CM

## 2020-04-14 DIAGNOSIS — D638 Anemia in other chronic diseases classified elsewhere: Secondary | ICD-10-CM

## 2020-04-14 DIAGNOSIS — E538 Deficiency of other specified B group vitamins: Secondary | ICD-10-CM

## 2020-04-14 DIAGNOSIS — K2100 Gastro-esophageal reflux disease with esophagitis, without bleeding: Secondary | ICD-10-CM | POA: Diagnosis not present

## 2020-04-14 DIAGNOSIS — E1159 Type 2 diabetes mellitus with other circulatory complications: Secondary | ICD-10-CM

## 2020-04-14 DIAGNOSIS — G4733 Obstructive sleep apnea (adult) (pediatric): Secondary | ICD-10-CM

## 2020-04-14 DIAGNOSIS — E785 Hyperlipidemia, unspecified: Secondary | ICD-10-CM

## 2020-04-14 DIAGNOSIS — E559 Vitamin D deficiency, unspecified: Secondary | ICD-10-CM | POA: Diagnosis not present

## 2020-04-14 DIAGNOSIS — N1831 Chronic kidney disease, stage 3a: Secondary | ICD-10-CM

## 2020-04-14 DIAGNOSIS — G4700 Insomnia, unspecified: Secondary | ICD-10-CM

## 2020-04-14 DIAGNOSIS — M5431 Sciatica, right side: Secondary | ICD-10-CM

## 2020-04-14 DIAGNOSIS — E1122 Type 2 diabetes mellitus with diabetic chronic kidney disease: Secondary | ICD-10-CM | POA: Diagnosis not present

## 2020-04-14 DIAGNOSIS — R269 Unspecified abnormalities of gait and mobility: Secondary | ICD-10-CM

## 2020-04-14 DIAGNOSIS — E1169 Type 2 diabetes mellitus with other specified complication: Secondary | ICD-10-CM

## 2020-04-14 DIAGNOSIS — I152 Hypertension secondary to endocrine disorders: Secondary | ICD-10-CM

## 2020-04-14 DIAGNOSIS — I10 Essential (primary) hypertension: Secondary | ICD-10-CM | POA: Diagnosis not present

## 2020-04-14 LAB — POCT GLYCOSYLATED HEMOGLOBIN (HGB A1C): Hemoglobin A1C: 6.4 % — AB (ref 4.0–5.6)

## 2020-04-14 MED ORDER — DICLOFENAC SODIUM 75 MG PO TBEC: 75.0000 mg | DELAYED_RELEASE_TABLET | Freq: Two times a day (BID) | ORAL | 0 refills | Status: DC

## 2020-04-14 MED ORDER — QUETIAPINE FUMARATE 25 MG PO TABS: 25.0000 mg | ORAL_TABLET | Freq: Every day | ORAL | 1 refills | Status: DC

## 2020-04-23 ENCOUNTER — Telehealth: Payer: Self-pay | Admitting: Family Medicine

## 2020-04-23 NOTE — Telephone Encounter (Signed)
Copied from CRM #[id number]. Topic: Medicare AWV >> Apr 23, 2020  3:01 PM Claudette Laws R wrote: Reason for CRM:   Line busy unable to leave a message for patient to call back and schedule Medicare Annual Wellness Visit (AWV) in office.   If unable to come into the office for AWV,  please offer to do virtually or by telephone.  No hx of AWV eligible as of 03/01/2019 awvi  Please schedule at anytime with [facility].      40 Minutes appointment   Any questions, please call me at [phone]

## 2020-06-08 ENCOUNTER — Telehealth: Payer: Self-pay | Admitting: Family Medicine

## 2020-06-08 NOTE — Telephone Encounter (Signed)
Copied from CRM [id number]. Topic: Medicare AWV >> Jun 08, 2020 11:41 AM Claudette Laws R wrote: Reason for CRM:  Left message for patient to call back and schedule Medicare Annual Wellness Visit (AWV) in office.   If unable to come into the office for AWV,  please offer to do virtually or by telephone.  No hx of AWV eligible as of 03/01/2019 awvi  Please schedule at anytime with [facility].      40 Minutes appointment   Any questions, please call me at [phone]

## 2020-07-12 ENCOUNTER — Other Ambulatory Visit: Payer: Self-pay | Admitting: Family Medicine

## 2020-07-12 DIAGNOSIS — E1122 Type 2 diabetes mellitus with diabetic chronic kidney disease: Secondary | ICD-10-CM

## 2020-07-12 DIAGNOSIS — I10 Essential (primary) hypertension: Secondary | ICD-10-CM

## 2020-07-12 DIAGNOSIS — G4700 Insomnia, unspecified: Secondary | ICD-10-CM

## 2020-07-12 DIAGNOSIS — E1169 Type 2 diabetes mellitus with other specified complication: Secondary | ICD-10-CM

## 2020-07-12 DIAGNOSIS — E1142 Type 2 diabetes mellitus with diabetic polyneuropathy: Secondary | ICD-10-CM

## 2020-07-12 NOTE — Telephone Encounter (Signed)
Requested Prescriptions  Pending Prescriptions Disp Refills  . gabapentin (NEURONTIN) 300 MG capsule [Pharmacy Med Name: GABAPENTIN 300MG CAPSULES] 180 capsule 2    Sig: TAKE 2 CAPSULES(600 MG) BY MOUTH AT BEDTIME     Neurology: Anticonvulsants - gabapentin Passed - 07/12/2020  3:50 PM      Passed - Valid encounter within last 12 months    Recent Outpatient Visits          2 months ago Type 2 diabetes mellitus with stage 3a chronic kidney disease, without long-term current use of insulin ([place])   [facility] [place], Drue Stager, MD   7 months ago Type 2 diabetes mellitus with stage 2 chronic kidney disease, without long-term current use of insulin [facility])   [facility] [place], Drue Stager, MD   11 months ago Type 2 diabetes mellitus with stage 2 chronic kidney disease, without long-term current use of insulin [facility])   [facility] [place], Drue Stager, MD   1 year ago Type 2 diabetes mellitus with stage 2 chronic kidney disease, without long-term current use of insulin [facility])   [facility] Steele Sizer, MD   1 year ago Colon cancer screening   [facility] Sizer, MD      Future Appointments            In 1 month Steele Sizer, MD [facility]           . metFORMIN (GLUCOPHAGE) 1000 MG tablet [Pharmacy Med Name: METFORMIN 1000MG TABLETS] 180 tablet 0    Sig: TAKE 1 TABLET(1000 MG) BY MOUTH TWICE DAILY WITH A MEAL     Endocrinology:  Diabetes - Biguanides Failed - 07/12/2020  3:50 PM      Failed - Cr in normal range and within 360 days    Creat  Date Value Ref Range Status  08/05/2019 1.22 (H) 0.70 - 1.18 mg/dL Final    Comment:    For patients >59 years of age, the reference limit for Creatinine is approximately 13% higher for people identified as African-American. .          Failed - eGFR in normal range and within 360 days    GFR, Est African American   Date Value Ref Range Status  08/05/2019 69 > OR = 60 mL/min/1.47m Final   GFR, Est Non African American  Date Value Ref Range Status  08/05/2019 59 (L) > OR = 60 mL/min/1.771mFinal         Passed - HBA1C is between 0 and 7.9 and within 180 days    Hemoglobin A1C  Date Value Ref Range Status  04/14/2020 6.4 (A) 4.0 - 5.6 % Final   HbA1c, POC (prediabetic range)  Date Value Ref Range Status  02/06/2018 6.6 (A) 5.7 - 6.4 % Final   HbA1c, POC (controlled diabetic range)  Date Value Ref Range Status  02/06/2018 6.6 0.0 - 7.0 % Final   HbA1c POC (<> result, manual entry)  Date Value Ref Range Status  02/06/2018 6.6 4.0 - 5.6 % Final   Hgb A1c MFr Bld  Date Value Ref Range Status  11/27/2018 7.4 (H) 4.8 - 5.6 % Final    Comment:             Prediabetes: 5.7 - 6.4          Diabetes: >6.4          Glycemic control for adults with diabetes: <7.0  Passed - Valid encounter within last 6 months    Recent Outpatient Visits          2 months ago Type 2 diabetes mellitus with stage 3a chronic kidney disease, without long-term current use of insulin [facility])   [facility] [place], Drue Stager, MD   7 months ago Type 2 diabetes mellitus with stage 2 chronic kidney disease, without long-term current use of insulin [facility])   [facility] [place], Drue Stager, MD   11 months ago Type 2 diabetes mellitus with stage 2 chronic kidney disease, without long-term current use of insulin [facility])   [facility] [place], Drue Stager, MD   1 year ago Type 2 diabetes mellitus with stage 2 chronic kidney disease, without long-term current use of insulin [facility])   [facility] Sizer, MD   1 year ago Colon cancer screening   [facility] [place], Drue Stager, MD      Future Appointments            In 1 month Steele Sizer, MD [facility], [facility]           . losartan (COZAAR) 100 MG tablet  [Pharmacy Med Name: LOSARTAN 100MG TABLETS] 90 tablet 0    Sig: TAKE 1 TABLET(100 MG) BY MOUTH DAILY     Cardiovascular:  Angiotensin Receptor Blockers Failed - 07/12/2020  3:50 PM      Failed - Cr in normal range and within 180 days    Creat  Date Value Ref Range Status  08/05/2019 1.22 (H) 0.70 - 1.18 mg/dL Final    Comment:    For patients >31 years of age, the reference limit for Creatinine is approximately 13% higher for people identified as African-American. .          Failed - K in normal range and within 180 days    Potassium  Date Value Ref Range Status  08/05/2019 4.4 3.5 - 5.3 mmol/L Final         Passed - Patient is not pregnant      Passed - Last BP in normal range    BP Readings from Last 1 Encounters:  04/14/20 136/84         Passed - Valid encounter within last 6 months    Recent Outpatient Visits          2 months ago Type 2 diabetes mellitus with stage 3a chronic kidney disease, without long-term current use of insulin ([place])   [facility] [place], Drue Stager, MD   7 months ago Type 2 diabetes mellitus with stage 2 chronic kidney disease, without long-term current use of insulin [facility])   [facility] [place], Drue Stager, MD   11 months ago Type 2 diabetes mellitus with stage 2 chronic kidney disease, without long-term current use of insulin [facility])   [facility] [place], Drue Stager, MD   1 year ago Type 2 diabetes mellitus with stage 2 chronic kidney disease, without long-term current use of insulin [facility])   [facility] Steele Sizer, MD   1 year ago Colon cancer screening   [facility] Sizer, MD      Future Appointments            In 1 month Steele Sizer, MD [facility] [place]           . atorvastatin (LIPITOR) 40 MG tablet [Pharmacy Med Name: ATORVASTATIN 40MG  TABLETS] 90 tablet 0    Sig: TAKE 1 TABLET(40 MG) BY MOUTH DAILY      Cardiovascular:  Antilipid - Statins Failed - 07/12/2020  3:50 PM      Failed - HDL in normal range and within 360 days    HDL  Date Value Ref Range Status  08/05/2019 32 (L) > OR = 40 mg/dL Final  09/18/2018 29 (L) >39 mg/dL Final         Failed - Triglycerides in normal range and within 360 days    Triglycerides  Date Value Ref Range Status  08/05/2019 202 (H) <150 mg/dL Final    Comment:    . If a non-fasting specimen was collected, consider repeat triglyceride testing on a fasting specimen if clinically indicated.  Yates Decamp et al. J. of Clin. Lipidol. 0223;3:612-244. Marland Kitchen          Passed - Total Cholesterol in normal range and within 360 days    Cholesterol, Total  Date Value Ref Range Status  09/18/2018 142 100 - 199 mg/dL Final   Cholesterol  Date Value Ref Range Status  08/05/2019 112 <200 mg/dL Final         Passed - LDL in normal range and within 360 days    LDL Cholesterol (Calc)  Date Value Ref Range Status  08/05/2019 53 mg/dL (calc) Final    Comment:    Reference range: <100 . Desirable range <100 mg/dL for primary prevention;   <70 mg/dL for patients with CHD or diabetic patients  with > or = 2 CHD risk factors. Marland Kitchen LDL-C is now calculated using the Martin-Hopkins  calculation, which is a validated novel method providing  better accuracy than the Friedewald equation in the  estimation of LDL-C.  Cresenciano Genre et al. Annamaria Helling. 9753;005(11): 2061-2068  (http://education.[employer].com/faq/FAQ164)          Passed - Patient is not pregnant      Passed - Valid encounter within last 12 months    Recent Outpatient Visits          2 months ago Type 2 diabetes mellitus with stage 3a chronic kidney disease, without long-term current use of insulin ([place])   [facility] [place], Drue Stager, MD   7 months ago Type 2 diabetes mellitus with stage 2 chronic kidney disease, without long-term current use of insulin [facility])   [place], Drue Stager, MD   11 months ago Type 2 diabetes mellitus with stage 2 chronic kidney disease, without long-term current use of insulin [facility])   [facility] [place], Drue Stager, MD   1 year ago Type 2 diabetes mellitus with stage 2 chronic kidney disease, without long-term current use of insulin [facility])   [facility] Steele Sizer, MD   1 year ago Colon cancer screening   [facility]izer, MD      Future Appointments            In 1 month Steele Sizer, MD [facility]           . hydrOXYzine (ATARAX/VISTARIL) 25 MG tablet [Pharmacy Med Name: HYDROXYZINE HCL 25MG TABS (WHITE)] 90 tablet 2    Sig: TAKE 1 TABLET(25 MG) BY MOUTH AT BEDTIME     Ear, Nose, and Throat:  Antihistamines Passed - 07/12/2020  3:50 PM      Passed - Valid encounter within last 12 months    Recent Outpatient Visits  2 months ago Type 2 diabetes mellitus with stage 3a chronic kidney disease, without long-term current use of insulin [facility])   [facility] [place], Drue Stager, MD   7 months ago Type 2 diabetes mellitus with stage 2 chronic kidney disease, without long-term current use of insulin [facility])   [facility] [place], Drue Stager, MD   11 months ago Type 2 diabetes mellitus with stage 2 chronic kidney disease, without long-term current use of insulin [facility])   [facility] [place], Drue Stager, MD   1 year ago Type 2 diabetes mellitus with stage 2 chronic kidney disease, without long-term current use of insulin [facility])   [facility] Steele Sizer, MD   1 year ago Colon cancer screening   [facility] Sizer, MD      Future Appointments            In 1 month Steele Sizer, MD [facility], [facility]

## 2020-08-17 NOTE — Progress Notes (Signed)
Name: Brent Carroll   MRN: [id number]    DOB: 1945-08-20   Date:08/18/2020       Progress Note  Subjective  Chief Complaint  Follow up   HPI  DMII: A1C went up from 6.7 % to 6.9% ,6.4 % today is 6.6 %.  He has been compliant with medication and drinking more water. He denies polyphagia , polyuria or polydipsia. DM with CKI on ARB, he states neuropathic pain on legs has been stable on gabapentin, has dyslipidemia and is only on crestor, cannot afford lovaza , last triglycerides was 200's , we will recheck labs today including urine micro He denies side effects of medications. He is due for eye exam    Hyperlipidemia: Continue Crestor for now, last LDL was at goal at 53 June 2021. Triglycerides stable in the low 200's He is due for repeat lab work today    OSA: he states Dr. Thana Ates ordered  his sleep study, he was unable to tolerate CPAP machine, he also states too expensive to go back for a repeat study. Unchanged    GERD: he has occasional heartburn symptoms and takes Tums very seldom, symptoms are unchanged    Insomnia: he is off Ambien CR, he is taking Seroquel and gabapentin, he watches TV at night until 11 pm, but trying to fall asleep before midnight now. He states only has difficulty falling asleep but staying asleep for at least  8 hours. Continue medications. He denies any  side effects. He very seldom gets up to void at night and it takes about 10-15 minutes to fall back asleep   Morbid Obesity: he has multiple co-morbidities and BMI above 35 , weight has been stable , discussed importance of weight loss. He states he is drinking more water and no longer eating out as often, he gained 5 lbs since last visit. He is not sure why he gained weight.    B12 and Vitamin D deficiency: he is due for repeat labs, he has been taking Vitamin D weekly now and occasionally taking B12   CKI stage III: explained he needs to avoid NSAID's, drink water and keep diabetes and bp under control Last  levels also showed anemia of chronic disease, based on labs done 07/2019 GFR 59 , we will repeat levels to  Right hamstring pain: he has a history of sciatica right side but this time it is different, pain is mostly on posterior thigh, pain is described as a tightness when he first gets up from bed in the mornings, also after he has been sitting for a long period of time, but resolves by itself . Explained it may be from his lower back pain.   HTN: he is taking medication as prescribed, no chest pain or palpitation. BP today is at goal. He has very mild lower extremity edema and is stable. 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) 11/12/2015   Vitamin D deficiency 08/14/2015   CN (constipation) 01/07/2015   Hyperlipidemia 01/07/2015   Chronic kidney disease, stage II (mild) 08/25/2014   Narrowing of intervertebral disc space 08/25/2014   Type 2 diabetes mellitus with other diabetic kidney complication (HCC) 08/25/2014   Failure  of erection 08/25/2014   Acid reflux 08/25/2014   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   Obstructive apnea 08/25/2014   Benign essential HTN 03/16/2009   Insomnia, persistent 02/10/2009    Past Surgical History:  Procedure Laterality Date   HERNIA REPAIR     HERNIA REPAIR Bilateral    inguinal    TONSILLECTOMY      Family History  Problem Relation Age of Onset   Heart disease Father    COPD Father     Social History   Tobacco Use   Smoking status: Former    Pack years: 0.00    Types: Cigarettes    Quit date: 04/04/1986    Years since quitting: 34.3   Smokeless tobacco: Never  Substance Use Topics   Alcohol use: No    Alcohol/week: 0.0 standard drinks      Current Outpatient Medications:    acetaminophen (TYLENOL) 500 MG tablet, Take 1 tablet (500 mg total) by mouth every 6 (six) hours as needed., Disp: 60 tablet, Rfl: 0   atorvastatin (LIPITOR) 40 MG tablet, TAKE 1 TABLET(40 MG) BY MOUTH DAILY, Disp: 90 tablet, Rfl: 0   Cyanocobalamin (B-12) 1000 MCG SUBL, Place 1 mcg under the tongue daily., Disp: 90 each, Rfl: 1   diclofenac (VOLTAREN) 75 MG EC tablet, Take 1 tablet (75 mg total) by mouth 2 (two) times daily. Take for 5 days and after that prn only, Disp: 30 tablet, Rfl: 0   gabapentin (NEURONTIN) 300 MG capsule, TAKE 2 CAPSULES(600 MG) BY MOUTH AT BEDTIME, Disp: 180 capsule, Rfl: 2   hydrOXYzine (ATARAX/VISTARIL) 25 MG tablet, TAKE 1 TABLET(25 MG) BY MOUTH AT BEDTIME, Disp: 90 tablet, Rfl: 2   losartan (COZAAR) 100 MG tablet, TAKE 1 TABLET(100 MG) BY MOUTH DAILY, Disp: 90 tablet, Rfl: 0   metFORMIN (GLUCOPHAGE) 1000 MG tablet, TAKE 1 TABLET(1000 MG) BY MOUTH TWICE DAILY WITH A MEAL, Disp: 180 tablet, Rfl: 0   QUEtiapine (SEROQUEL) 25 MG tablet, Take 1 tablet (25 mg total) by mouth at bedtime., Disp: 90 tablet, Rfl: 1   Vitamin D, Ergocalciferol, (DRISDOL) 1.25 MG (50000 UNIT) CAPS capsule, TAKE 1 CAPSULE BY MOUTH EVERY 7 DAYS, Disp: 12 capsule, Rfl: 1  Allergies  Allergen Reactions   Trazodone And Nefazodone    Trazodone Anxiety    I personally reviewed active problem list, medication list, allergies, family history, social history, health maintenance with the patient/caregiver today.   ROS  Constitutional: Negative for fever or weight change.  Respiratory: Negative for cough and shortness of breath.   Cardiovascular: Negative for chest pain or palpitations.  Gastrointestinal: Negative for abdominal pain, no bowel changes.  Musculoskeletal: positive for gait problem but no joint swelling.  Skin: Negative for rash.  Neurological: Negative for dizziness or headache.  No other specific complaints in a complete review of systems  (except as listed in HPI above).   Objective  Vitals:   08/18/20 1015  BP: 120/70  Pulse: 81  Resp: 16  Temp: 97.6 F (36.4 C)  TempSrc: Oral  SpO2: 96%  Weight: 246 lb (111.6 kg)  Height: 5\' 10"  (1.778 m)    Body mass index is 35.3 kg/m.  Physical Exam  Constitutional: Patient appears well-developed and well-nourished. Obese  No distress.  HEENT: head atraumatic, normocephalic, pupils equal and reactive to light, neck supple,  Cardiovascular: Normal rate, regular rhythm and normal heart sounds.  No murmur heard. No BLE edema. Pulmonary/Chest: Effort normal and breath sounds normal. No respiratory distress. Abdominal: Soft.  There is no tenderness. Psychiatric: Patient has a normal mood and affect. behavior is normal. Judgment and thought content normal.  Muscular skeletal: normal rom lumbar spine, tight hamstring   Recent Results (from the past 2160 hour(s))  POCT HgB A1C     Status: Abnormal   Collection Time: 08/18/20 10:21 AM  Result Value Ref Range   Hemoglobin A1C 6.6 (A) 4.0 - 5.6 %   HbA1c POC (<> result, manual entry)     HbA1c, POC (prediabetic range)     HbA1c, POC (controlled diabetic range)      PHQ2/9: Depression screen [facility] 08/18/2020 04/14/2020 12/10/2019 08/05/2019 03/25/2019  Decreased Interest 0 0 0 0 0  Down, Depressed, Hopeless 0 0 0 0 0  PHQ - 2 Score 0 0 0 0 0  Altered sleeping - - - 0 0  Tired, decreased energy - - - 0 0  Change in appetite - - - 0 0  Feeling bad or failure about yourself  - - - 0 0  Trouble concentrating - - - 0 0  Moving slowly or fidgety/restless - - - 0 0  Suicidal thoughts - - - 0 0  PHQ-9 Score - - - 0 0  Difficult doing work/chores - - - Not difficult at all -  Some recent data might be hidden    phq 9 is negative  Fall Risk: Fall Risk  08/18/2020 04/14/2020 12/10/2019 08/05/2019 03/25/2019  Falls in the past year? 0 0 0 0 0  Number falls in past yr: 0 0 0 - 0  Comment - - - - -  Injury with Fall? 0 0 0 - 0   Comment - - - - -  Risk Factor Category  - - - - -  Risk for fall due to : - - - - -  Follow up - - - - -      Functional Status Survey: Is the patient deaf or have difficulty hearing?: No Does the patient have difficulty seeing, even when wearing glasses/contacts?: No Does the patient have difficulty concentrating, remembering, or making decisions?: No Does the patient have difficulty walking or climbing stairs?: Yes Does the patient have difficulty dressing or bathing?: No Does the patient have difficulty doing errands alone such as visiting a doctor's office or shopping?: No   Assessment & Plan  1. Type 2 diabetes mellitus with stage 3a chronic kidney disease, without long-term current use of insulin (HCC)  - POCT HgB A1C - losartan (COZAAR) 100 MG tablet; Take 1 tablet (100 mg total) by mouth daily.  Dispense: 90 tablet; Refill: 1  2. Colon cancer screening  - Ambulatory referral to Gastroenterology - Cologuard  3. B12 deficiency  - B12 and Folate Panel  4. Obstructive apnea   5. Anemia of chronic disease   6. Vitamin D deficiency  - VITAMIN D 25 Hydroxy (Vit-D Deficiency, Fractures) - Vitamin D, Ergocalciferol, (DRISDOL) 1.25 MG (50000 UNIT) CAPS capsule; Take 1 capsule (50,000 Units total) by mouth every 7 (seven) days.  Dispense: 12 capsule; Refill: 1  7. Gastroesophageal reflux disease with esophagitis without hemorrhage   8. Morbid obesity (HCC)  Discussed with the patient the risk posed by an increased BMI. Discussed importance of portion control, calorie counting and at least 150 minutes of physical activity weekly. Avoid sweet beverages and drink more water. Eat at least 6 servings of fruit and vegetables daily    9. Hypertension associated with type 2 diabetes mellitus (HCC)  10. Stage 3a chronic kidney disease (HCC)  - Microalbumin / creatinine urine ratio - losartan (COZAAR) 100 MG tablet; Take 1 tablet (100 mg total) by mouth daily.   Dispense: 90 tablet; Refill: 1  11. Benign hypertension with chronic kidney disease, stage III (HCC)  - Microalbumin / creatinine urine ratio - CBC with Differential/Platelet - COMPLETE METABOLIC PANEL WITH GFR - losartan (COZAAR) 100 MG tablet; Take 1 tablet (100 mg total) by mouth daily.  Dispense: 90 tablet; Refill: 1  12. Dyslipidemia associated with type 2 diabetes mellitus (HCC)  - Lipid panel - metFORMIN (GLUCOPHAGE) 1000 MG tablet; Take 1 tablet (1,000 mg total) by mouth 2 (two) times daily with a meal.  Dispense: 180 tablet; Refill: 1  13. Insomnia, persistent  - QUEtiapine (SEROQUEL) 25 MG tablet; Take 1 tablet (25 mg total) by mouth at bedtime.  Dispense: 90 tablet; Refill: 1  14. Cold intolerance  - TSH  15. Type 2 diabetes mellitus with stage 2 chronic kidney disease, without long-term current use of insulin (HCC)  - losartan (COZAAR) 100 MG tablet; Take 1 tablet (100 mg total) by mouth daily.  Dispense: 90 tablet; Refill: 1  16. Benign essential HTN  - losartan (COZAAR) 100 MG tablet; Take 1 tablet (100 mg total) by mouth daily.  Dispense: 90 tablet; Refill: 1

## 2020-08-18 ENCOUNTER — Other Ambulatory Visit: Payer: Self-pay

## 2020-08-18 ENCOUNTER — Encounter: Payer: Self-pay | Admitting: Family Medicine

## 2020-08-18 ENCOUNTER — Ambulatory Visit (INDEPENDENT_AMBULATORY_CARE_PROVIDER_SITE_OTHER): Payer: PPO | Admitting: Family Medicine

## 2020-08-18 VITALS — BP 120/70 | HR 81 | Temp 97.6°F | Resp 16 | Ht 70.0 in | Wt 246.0 lb

## 2020-08-18 DIAGNOSIS — E785 Hyperlipidemia, unspecified: Secondary | ICD-10-CM | POA: Diagnosis not present

## 2020-08-18 DIAGNOSIS — Z1211 Encounter for screening for malignant neoplasm of colon: Secondary | ICD-10-CM | POA: Diagnosis not present

## 2020-08-18 DIAGNOSIS — N182 Chronic kidney disease, stage 2 (mild): Secondary | ICD-10-CM

## 2020-08-18 DIAGNOSIS — E1169 Type 2 diabetes mellitus with other specified complication: Secondary | ICD-10-CM | POA: Diagnosis not present

## 2020-08-18 DIAGNOSIS — E559 Vitamin D deficiency, unspecified: Secondary | ICD-10-CM | POA: Diagnosis not present

## 2020-08-18 DIAGNOSIS — N183 Chronic kidney disease, stage 3 unspecified: Secondary | ICD-10-CM

## 2020-08-18 DIAGNOSIS — R6889 Other general symptoms and signs: Secondary | ICD-10-CM

## 2020-08-18 DIAGNOSIS — E1159 Type 2 diabetes mellitus with other circulatory complications: Secondary | ICD-10-CM | POA: Diagnosis not present

## 2020-08-18 DIAGNOSIS — K2100 Gastro-esophageal reflux disease with esophagitis, without bleeding: Secondary | ICD-10-CM | POA: Diagnosis not present

## 2020-08-18 DIAGNOSIS — E1122 Type 2 diabetes mellitus with diabetic chronic kidney disease: Secondary | ICD-10-CM

## 2020-08-18 DIAGNOSIS — I152 Hypertension secondary to endocrine disorders: Secondary | ICD-10-CM

## 2020-08-18 DIAGNOSIS — E538 Deficiency of other specified B group vitamins: Secondary | ICD-10-CM

## 2020-08-18 DIAGNOSIS — G4700 Insomnia, unspecified: Secondary | ICD-10-CM

## 2020-08-18 DIAGNOSIS — N1831 Chronic kidney disease, stage 3a: Secondary | ICD-10-CM

## 2020-08-18 DIAGNOSIS — G4733 Obstructive sleep apnea (adult) (pediatric): Secondary | ICD-10-CM | POA: Diagnosis not present

## 2020-08-18 DIAGNOSIS — D638 Anemia in other chronic diseases classified elsewhere: Secondary | ICD-10-CM | POA: Diagnosis not present

## 2020-08-18 DIAGNOSIS — I129 Hypertensive chronic kidney disease with stage 1 through stage 4 chronic kidney disease, or unspecified chronic kidney disease: Secondary | ICD-10-CM

## 2020-08-18 DIAGNOSIS — I10 Essential (primary) hypertension: Secondary | ICD-10-CM

## 2020-08-18 DIAGNOSIS — D649 Anemia, unspecified: Secondary | ICD-10-CM | POA: Diagnosis not present

## 2020-08-18 LAB — POCT GLYCOSYLATED HEMOGLOBIN (HGB A1C): Hemoglobin A1C: 6.6 % — AB (ref 4.0–5.6)

## 2020-08-18 MED ORDER — QUETIAPINE FUMARATE 25 MG PO TABS: 25.0000 mg | ORAL_TABLET | Freq: Every day | ORAL | 1 refills | Status: DC

## 2020-08-18 MED ORDER — VITAMIN D (ERGOCALCIFEROL) 1.25 MG (50000 UNIT) PO CAPS: 1.0000 | ORAL_CAPSULE | ORAL | 1 refills | Status: DC

## 2020-08-18 MED ORDER — METFORMIN HCL 1000 MG PO TABS: 1000.0000 mg | ORAL_TABLET | Freq: Two times a day (BID) | ORAL | 1 refills | Status: DC

## 2020-08-18 MED ORDER — LOSARTAN POTASSIUM 100 MG PO TABS: 100.0000 mg | ORAL_TABLET | Freq: Every day | ORAL | 1 refills | Status: DC

## 2020-08-18 NOTE — Patient Instructions (Signed)
Hamstring Strain Rehab Ask your health care provider which exercises are safe for you. Do exercises exactly as told by your health care provider and adjust them as directed. It is normal to feel mild stretching, pulling, tightness, or discomfort as you do these exercises. Stop right away if you feel sudden pain or your pain gets worse. Do not begin these exercises until told by your health care provider. Stretching and range-of-motion exercises These exercises warm up your muscles and joints and improve the movement and flexibility of your thighs. These exercises also help to relieve pain, numbness, and tingling. Talk to your health care provider about theserestrictions. Knee extension, seated  Sit with your left / right heel propped on a chair, a coffee table, or a footstool. Do not have anything under your knee to support it. Allow your leg muscles to relax, letting gravity straighten out your knee (extension). You should feel a stretch behind your left / right knee. If told by your health care provider, deepen the stretch by placing a __________ weight on your thigh, just above your kneecap. Hold this position for __________ seconds. Repeat __________ times. Complete this exercise __________ times a day. Seated stretch This exercise is sometimes called hamstrings and adductors stretch. Sit on the floor with your legs stretched wide. Keep your knees straight during this exercise. Keeping your head and back in a straight line, bend at your waist to reach for your left foot (position A). You should feel a stretch in your right inner thigh (adductors). Hold this position for __________ seconds. Then slowly return to the upright position. Keeping your head and back in a straight line, bend at your waist to reach forward (position B). You should feel a stretch behind both of your thighs or knees (hamstrings). Hold this position for __________ seconds. Then slowly return to the upright  position. Keeping your head and back in a straight line, bend at your waist to reach for your right foot (position C). You should feel a stretch in your left inner thigh (adductors). Hold this position for __________ seconds. Then slowly return to the upright position. Repeat __________ times. Complete this exercise __________ times a day. Hamstrings stretch, supine  Lie on your back (supine position). Loop a belt or towel over the ball of your left / right foot. The ball of your foot is on the walking surface, right under your toes. Straighten your left / right knee and slowly pull on the belt or towel to raise your leg. Do not let your left / right knee bend while you do this. Keep your other leg flat on the floor. Raise the left / right leg until you feel a gentle stretch behind your left / right knee or thigh (hamstrings). Hold this position for __________ seconds. Slowly return your leg to the starting position. Repeat __________ times. Complete this exercise __________ times a day. Strengthening exercises These exercises build strength and endurance in your thighs. Endurance is theability to use your muscles for a long time, even after they get tired. Straight leg raises, prone This exercise strengthens the muscles that move the hips (hip extensors). Lie on your abdomen on a firm surface (prone position). Tense the muscles in your buttocks and lift your left / right leg about 4 inches (10 cm). Keep your knee straight as you lift your leg. If you cannot lift your leg that high without arching your back, place a pillow under your hips. Hold the position for __________ seconds. Slowly  lower your leg to the starting position. Allow your muscles to relax completely before you start the next repetition. Repeat __________ times. Complete this exercise __________ times a day. Bridge This exercise strengthens the muscles in your buttocks and the back of your thighs (hip extensors). Lie on  your back on a firm surface with your knees bent and your feet flat on the floor. Tighten your buttocks muscles and lift your bottom off the floor until the trunk of your body is level with your thighs. You should feel the muscles working in your buttocks and the back of your thighs. Do not arch your back. Hold this position for __________ seconds. Slowly lower your hips to the starting position. Let your buttocks muscles relax completely between repetitions. If told by your health care provider, keep your bottom lifted off the floor while you slowly walk your feet away from you as far as you can control. Hold for __________ seconds, then slowly walk your feet back toward you. Repeat __________ times. Complete this exercise __________ times a day. Lateral walking with band This is an exercise in which you walk sideways (lateral), with tension provided by an exercise band. The exercise strengthens the muscles in your hip (hip abductors). Stand in a long hallway. Wrap a loop of exercise band around your legs, just above your knees. Bend your knees gently and drop your hips down and back so your weight is over your heels. Step to the side to move down the length of the hallway, keeping your toes pointed ahead of you and keeping tension in the band. Repeat, leading with your other leg. Repeat __________ times. Complete this exercise __________ times a day. Single leg stand with reaching This exercise is also called eccentric hamstring stretch. Stand on your left / right foot. Keep your big toe down on the floor and try to keep your arch lifted. Slowly reach down toward the floor as far as you can while keeping your balance. Lowering your thigh under tension is called eccentric stretching. Hold this position for __________ seconds. Repeat __________ times. Complete this exercise __________ times a day. Plank, prone This exercise strengthens muscles in your abdomen and core area. Lie on your  abdomen on the floor (prone position),and prop yourself up on your elbows. Your hands should be straight out in front of you, and your elbows should be below your shoulders. Position your feet similar to a push-up position so your toes are on the ground. Tighten your abdominal muscles and lift your body off the floor. Do not arch your back. Do not hold your breath. Hold this position for __________ seconds. Repeat __________ times. Complete this exercise __________ times a day. This information is not intended to replace advice given to you by your health care provider. Make sure you discuss any questions you have with your healthcare provider. Document Revised: 06/07/2018 Document Reviewed: 02/12/2018 Elsevier Patient Education  2022 [employer].

## 2020-08-19 ENCOUNTER — Other Ambulatory Visit: Payer: Self-pay | Admitting: Family Medicine

## 2020-08-19 DIAGNOSIS — D649 Anemia, unspecified: Secondary | ICD-10-CM

## 2020-08-19 LAB — CBC WITH DIFFERENTIAL/PLATELET
Absolute Monocytes: 409 cells/uL (ref 200–950)
Basophils Absolute: 62 cells/uL (ref 0–200)
Basophils Relative: 1 %
Eosinophils Absolute: 229 cells/uL (ref 15–500)
Eosinophils Relative: 3.7 %
HCT: 37.6 % — ABNORMAL LOW (ref 38.5–50.0)
Hemoglobin: 12.6 g/dL — ABNORMAL LOW (ref 13.2–17.1)
Lymphs Abs: 1500 cells/uL (ref 850–3900)
MCH: 29.9 pg (ref 27.0–33.0)
MCHC: 33.5 g/dL (ref 32.0–36.0)
MCV: 89.3 fL (ref 80.0–100.0)
MPV: 10.2 fL (ref 7.5–12.5)
Monocytes Relative: 6.6 %
Neutro Abs: 3999 cells/uL (ref 1500–7800)
Neutrophils Relative %: 64.5 %
Platelets: 207 10*3/uL (ref 140–400)
RBC: 4.21 10*6/uL (ref 4.20–5.80)
RDW: 12.6 % (ref 11.0–15.0)
Total Lymphocyte: 24.2 %
WBC: 6.2 10*3/uL (ref 3.8–10.8)

## 2020-08-19 LAB — LIPID PANEL
Cholesterol: 118 mg/dL (ref <200)
HDL: 32 mg/dL — ABNORMAL LOW (ref >=40)
LDL Cholesterol (Calc): 56 mg/dL (calc)
Non-HDL Cholesterol (Calc): 86 mg/dL (calc) (ref <130)
Total CHOL/HDL Ratio: 3.7 (calc) (ref <5.0)
Triglycerides: 238 mg/dL — ABNORMAL HIGH (ref <150)

## 2020-08-19 LAB — COMPLETE METABOLIC PANEL WITH GFR
AG Ratio: 2 (calc) (ref 1.0–2.5)
ALT: 24 U/L (ref 9–46)
AST: 20 U/L (ref 10–35)
Albumin: 4.2 g/dL (ref 3.6–5.1)
Alkaline phosphatase (APISO): 63 U/L (ref 35–144)
BUN: 12 mg/dL (ref 7–25)
CO2: 21 mmol/L (ref 20–32)
Calcium: 8.9 mg/dL (ref 8.6–10.3)
Chloride: 109 mmol/L (ref 98–110)
Creat: 1.07 mg/dL (ref 0.70–1.18)
GFR, Est African American: 80 mL/min/{1.73_m2} (ref >=60)
GFR, Est Non African American: 69 mL/min/{1.73_m2} (ref >=60)
Globulin: 2.1 g/dL (calc) (ref 1.9–3.7)
Glucose, Bld: 154 mg/dL — ABNORMAL HIGH (ref 65–99)
Potassium: 4 mmol/L (ref 3.5–5.3)
Sodium: 141 mmol/L (ref 135–146)
Total Bilirubin: 1 mg/dL (ref 0.2–1.2)
Total Protein: 6.3 g/dL (ref 6.1–8.1)

## 2020-08-19 LAB — VITAMIN D 25 HYDROXY (VIT D DEFICIENCY, FRACTURES): Vit D, 25-Hydroxy: 61 ng/mL (ref 30–100)

## 2020-08-19 LAB — IRON,TIBC AND FERRITIN PANEL
%SAT: 23 % (calc) (ref 20–48)
Ferritin: 72 ng/mL (ref 24–380)
Iron: 70 ug/dL (ref 50–180)
TIBC: 311 mcg/dL (calc) (ref 250–425)

## 2020-08-19 LAB — MICROALBUMIN / CREATININE URINE RATIO
Creatinine, Urine: 128 mg/dL (ref 20–320)
Microalb, Ur: <0.2 mg/dL

## 2020-08-19 LAB — B12 AND FOLATE PANEL
Folate: 9.1 ng/mL
Vitamin B-12: 249 pg/mL (ref 200–1100)

## 2020-08-19 LAB — TEST AUTHORIZATION

## 2020-08-19 LAB — TSH: TSH: 1.93 mIU/L (ref 0.40–4.50)

## 2020-08-19 NOTE — Progress Notes (Signed)
All Lab results given. Pt expressed he will do B12 SL 1000 mcg. Ferritin, TIBC, and % Saturation added. Answered all questions, pt gave verbal understanding.

## 2020-08-26 ENCOUNTER — Encounter: Payer: Self-pay | Admitting: *Deleted

## 2020-09-08 DIAGNOSIS — E119 Type 2 diabetes mellitus without complications: Secondary | ICD-10-CM | POA: Diagnosis not present

## 2020-10-16 ENCOUNTER — Telehealth: Payer: Self-pay | Admitting: Family Medicine

## 2020-10-16 NOTE — Telephone Encounter (Signed)
Copied from CRM [id number]. Topic: Medicare AWV >> Oct 16, 2020 10:48 AM Claudette Laws R wrote: Reason for CRM:  Left message for patient to call back and schedule Medicare Annual Wellness Visit (AWV) in office.   If unable to come into the office for AWV,  please offer to do virtually or by telephone.  No hx of AWV eligible for AWVI as of 03/01/2019  Please schedule at anytime with [facility] Health Advisor.      40 Minutes appointment   Any questions, please call me at [phone]

## 2020-10-17 ENCOUNTER — Other Ambulatory Visit: Payer: Self-pay | Admitting: Family Medicine

## 2020-10-17 DIAGNOSIS — E785 Hyperlipidemia, unspecified: Secondary | ICD-10-CM

## 2020-10-17 DIAGNOSIS — E1169 Type 2 diabetes mellitus with other specified complication: Secondary | ICD-10-CM

## 2020-10-17 NOTE — Telephone Encounter (Signed)
Requested Prescriptions  Pending Prescriptions Disp Refills  . atorvastatin (LIPITOR) 40 MG tablet [Pharmacy Med Name: ATORVASTATIN 40MG  TABLETS] 90 tablet 3    Sig: TAKE 1 TABLET(40 MG) BY MOUTH DAILY     Cardiovascular:  Antilipid - Statins Failed - 10/17/2020  9:33 AM      Failed - HDL in normal range and within 360 days    HDL  Date Value Ref Range Status  08/18/2020 32 (L) > OR = 40 mg/dL Final  08/20/2020 29 (L) >39 mg/dL Final         Failed - Triglycerides in normal range and within 360 days    Triglycerides  Date Value Ref Range Status  08/18/2020 238 (H) <150 mg/dL Final    Comment:    . If a non-fasting specimen was collected, consider repeat triglyceride testing on a fasting specimen if clinically indicated.  08/20/2020 et al. J. of Clin. Lipidol. 2015;9:129-169. [place]          Passed - Total Cholesterol in normal range and within 360 days    Cholesterol, Total  Date Value Ref Range Status  09/18/2018 142 100 - 199 mg/dL Final   Cholesterol  Date Value Ref Range Status  08/18/2020 118 <200 mg/dL Final         Passed - LDL in normal range and within 360 days    LDL Cholesterol (Calc)  Date Value Ref Range Status  08/18/2020 56 mg/dL (calc) Final    Comment:    Reference range: <100 . Desirable range <100 mg/dL for primary prevention;   <70 mg/dL for patients with CHD or diabetic patients  with > or = 2 CHD risk factors. 08/20/2020 LDL-C is now calculated using the Martin-Hopkins  calculation, which is a validated novel method providing  better accuracy than the Friedewald equation in the  estimation of LDL-C.  Marland Kitchen et al. Horald Pollen. Lenox Ahr): 2061-2068  (http://education.[employer].com/faq/FAQ164)          Passed - Patient is not pregnant      Passed - Valid encounter within last 12 months    Recent Outpatient Visits          2 months ago Type 2 diabetes mellitus with stage 3a chronic kidney disease, without long-term current use of insulin (HCC)    [facility] [place], [place], MD   6 months ago Type 2 diabetes mellitus with stage 3a chronic kidney disease, without long-term current use of insulin [facility])   [facility] [address], Leugnies, MD   10 months ago Type 2 diabetes mellitus with stage 2 chronic kidney disease, without long-term current use of insulin [facility])   [facility] [place], Leugnies, MD   1 year ago Type 2 diabetes mellitus with stage 2 chronic kidney disease, without long-term current use of insulin [facility])   [facility] [place], Leugnies, MD   1 year ago Type 2 diabetes mellitus with stage 2 chronic kidney disease, without long-term current use of insulin [facility])   [facility], MD      Future Appointments            In 2 months Alba Cory, Carlynn Purl, MD [facility], [facility]

## 2020-12-18 ENCOUNTER — Ambulatory Visit (INDEPENDENT_AMBULATORY_CARE_PROVIDER_SITE_OTHER): Payer: PPO | Admitting: Family Medicine

## 2020-12-18 ENCOUNTER — Encounter: Payer: Self-pay | Admitting: Family Medicine

## 2020-12-18 ENCOUNTER — Other Ambulatory Visit: Payer: Self-pay

## 2020-12-18 VITALS — BP 124/72 | HR 94 | Temp 98.0°F | Resp 18 | Ht 70.0 in | Wt 253.3 lb

## 2020-12-18 DIAGNOSIS — D638 Anemia in other chronic diseases classified elsewhere: Secondary | ICD-10-CM | POA: Diagnosis not present

## 2020-12-18 DIAGNOSIS — E1169 Type 2 diabetes mellitus with other specified complication: Secondary | ICD-10-CM | POA: Diagnosis not present

## 2020-12-18 DIAGNOSIS — E1142 Type 2 diabetes mellitus with diabetic polyneuropathy: Secondary | ICD-10-CM

## 2020-12-18 DIAGNOSIS — E1159 Type 2 diabetes mellitus with other circulatory complications: Secondary | ICD-10-CM | POA: Diagnosis not present

## 2020-12-18 DIAGNOSIS — Z23 Encounter for immunization: Secondary | ICD-10-CM

## 2020-12-18 DIAGNOSIS — G4733 Obstructive sleep apnea (adult) (pediatric): Secondary | ICD-10-CM

## 2020-12-18 DIAGNOSIS — E1122 Type 2 diabetes mellitus with diabetic chronic kidney disease: Secondary | ICD-10-CM

## 2020-12-18 DIAGNOSIS — R2681 Unsteadiness on feet: Secondary | ICD-10-CM

## 2020-12-18 DIAGNOSIS — N1831 Chronic kidney disease, stage 3a: Secondary | ICD-10-CM | POA: Diagnosis not present

## 2020-12-18 DIAGNOSIS — E559 Vitamin D deficiency, unspecified: Secondary | ICD-10-CM | POA: Diagnosis not present

## 2020-12-18 DIAGNOSIS — E538 Deficiency of other specified B group vitamins: Secondary | ICD-10-CM | POA: Diagnosis not present

## 2020-12-18 DIAGNOSIS — G4700 Insomnia, unspecified: Secondary | ICD-10-CM

## 2020-12-18 DIAGNOSIS — R42 Dizziness and giddiness: Secondary | ICD-10-CM

## 2020-12-18 DIAGNOSIS — K2100 Gastro-esophageal reflux disease with esophagitis, without bleeding: Secondary | ICD-10-CM

## 2020-12-18 DIAGNOSIS — E785 Hyperlipidemia, unspecified: Secondary | ICD-10-CM

## 2020-12-18 DIAGNOSIS — I152 Hypertension secondary to endocrine disorders: Secondary | ICD-10-CM

## 2020-12-18 LAB — POCT GLYCOSYLATED HEMOGLOBIN (HGB A1C): Hemoglobin A1C: 7.9 % — AB (ref 4.0–5.6)

## 2020-12-18 MED ORDER — LOSARTAN POTASSIUM 100 MG PO TABS: 100.0000 mg | ORAL_TABLET | Freq: Every day | ORAL | 1 refills | Status: DC

## 2020-12-18 MED ORDER — METFORMIN HCL 1000 MG PO TABS: 1000.0000 mg | ORAL_TABLET | Freq: Two times a day (BID) | ORAL | 1 refills | Status: DC

## 2020-12-18 MED ORDER — VITAMIN D (ERGOCALCIFEROL) 1.25 MG (50000 UNIT) PO CAPS: 50000.0000 [IU] | ORAL_CAPSULE | ORAL | 1 refills | Status: DC

## 2020-12-18 MED ORDER — CYANOCOBALAMIN 1000 MCG/ML IJ SOLN
1000.0000 ug | Freq: Once | INTRAMUSCULAR | Status: AC
  Administered 2020-12-18: 1000 ug via INTRAMUSCULAR

## 2020-12-18 NOTE — Progress Notes (Signed)
Name: Brent Carroll   MRN: [id number]    DOB: September 27, 1945   Date:12/18/2020       Progress Note  Subjective  Chief Complaint  Follow Up  HPI  DMII: A1C went up from 6.7 % to 6.9% ,6.4 % ,6.6 % and today is up to 7.9 % .  He has been compliant with medication and drinking more water. He denies polyphagia , polyuria or polydipsia. DM with CKI on ARB, he states neuropathic pain on legs has been stable on gabapentin, has dyslipidemia and is taking  Crestor, cannot afford lovaza , last triglycerides was 200's ,last urine micro was normal. He was eating more junk after his son in law died about one month ago   Stress: his son in law died in a tragic MVA about one month ago, he has been eating more than usual and gained weight, he thinks that is the cause for the increase in A1C   Hyperlipidemia: Continue Crestor for now, last triglycerides was 238 ( slightly up) LDL at goal still at 56. HDL low and discussed increasing fish and tree nuts intake    OSA: he states Dr. Thana Ates ordered  his sleep study, he was unable to tolerate CPAP machine, he also states too expensive to go back for a repeat study. Unchanged    GERD: he has occasional heartburn symptoms and takes Tums very seldom, symptoms are stable    Insomnia: he is off Ambien CR, he is taking Seroquel and gabapentin, he watches TV at night until 11 pm, but trying to fall asleep before midnight now. He states only has difficulty falling asleep but staying asleep for at least  8 hours. Continue medications. He denies any  side effects. He very seldom gets up to void at night and it takes about 10-15 minutes to fall back asleep, he has been waking up feeling tight and is planning on getting a new mattress.    Morbid Obesity: he has multiple co-morbidities and BMI above 35 , weight has been stable , discussed importance of weight loss. He states he is drinking more water and no longer eating out as often, he gained more weight since last visit    B12 and Vitamin D deficiency: he is due for repeat labs, he has been taking Vitamin D weekly now , last B12 was very low, reminded him again the importance of taking supplementation   CKI stage III: explained he needs to avoid NSAID's, drink water and keep diabetes and bp under control Last levels also showed anemia of chronic disease, GFR was better, but has been in the 50's for a while.   Gait instability: he has dizziness, uses a cane, also has tight hamstring , discussed going back to PT. He has problems with his inner ear problem. He does not want to go back PT because of cost. Seen by ENT in 2014 - Dr. Willeen Cass had evaluation done including MRI brain, uses a cane when out of the house.   HTN: he is taking medication as prescribed, no chest pain or palpitation. BP today is at goal. He has very mild lower extremity edema and is stable. 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) 11/12/2015   Vitamin D deficiency 08/14/2015   CN (constipation) 01/07/2015   Hyperlipidemia  01/07/2015   Chronic kidney disease, stage II (mild) 08/25/2014   Narrowing of intervertebral disc space 08/25/2014   Type 2 diabetes mellitus with other diabetic kidney complication (HCC) 08/25/2014   Failure of erection 08/25/2014   Acid reflux 08/25/2014   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   Obstructive apnea 08/25/2014   Benign essential HTN 03/16/2009   Insomnia, persistent 02/10/2009    Past Surgical History:  Procedure Laterality Date   HERNIA REPAIR     HERNIA REPAIR Bilateral    inguinal    TONSILLECTOMY      Family History  Problem Relation Age of Onset   Heart disease Father    COPD Father     Social History   Tobacco Use   Smoking status: Former    Types: Cigarettes     Quit date: 04/04/1986    Years since quitting: 34.7   Smokeless tobacco: Never  Substance Use Topics   Alcohol use: No    Alcohol/week: 0.0 standard drinks     Current Outpatient Medications:    acetaminophen (TYLENOL) 500 MG tablet, Take 1 tablet (500 mg total) by mouth every 6 (six) hours as needed., Disp: 60 tablet, Rfl: 0   atorvastatin (LIPITOR) 40 MG tablet, TAKE 1 TABLET(40 MG) BY MOUTH DAILY, Disp: 90 tablet, Rfl: 3   Cyanocobalamin (B-12) 1000 MCG SUBL, Place 1 mcg under the tongue daily., Disp: 90 each, Rfl: 1   gabapentin (NEURONTIN) 300 MG capsule, TAKE 2 CAPSULES(600 MG) BY MOUTH AT BEDTIME, Disp: 180 capsule, Rfl: 2   hydrOXYzine (ATARAX/VISTARIL) 25 MG tablet, TAKE 1 TABLET(25 MG) BY MOUTH AT BEDTIME, Disp: 90 tablet, Rfl: 2   losartan (COZAAR) 100 MG tablet, Take 1 tablet (100 mg total) by mouth daily., Disp: 90 tablet, Rfl: 1   metFORMIN (GLUCOPHAGE) 1000 MG tablet, Take 1 tablet (1,000 mg total) by mouth 2 (two) times daily with a meal., Disp: 180 tablet, Rfl: 1   QUEtiapine (SEROQUEL) 25 MG tablet, Take 1 tablet (25 mg total) by mouth at bedtime., Disp: 90 tablet, Rfl: 1   Vitamin D, Ergocalciferol, (DRISDOL) 1.25 MG (50000 UNIT) CAPS capsule, Take 1 capsule (50,000 Units total) by mouth every 7 (seven) days., Disp: 12 capsule, Rfl: 1  Allergies  Allergen Reactions   Trazodone And Nefazodone    Trazodone Anxiety    I personally reviewed active problem list, medication list, allergies, family history, social history, health maintenance with the patient/caregiver today.   ROS  Constitutional: Negative for fever or weight change.  Respiratory: Negative for cough and shortness of breath.   Cardiovascular: Negative for chest pain or palpitations.  Gastrointestinal: Negative for abdominal pain, no bowel changes.  Musculoskeletal: Positive  for gait problem but no joint swelling.  Skin: Negative for rash.  Neurological: Negative for dizziness or headache.  No other  specific complaints in a complete review of systems (except as listed in HPI above).   Objective  Vitals:   12/18/20 1017  BP: 124/72  Pulse: 94  Resp: 18  Temp: 98 F (36.7 C)  TempSrc: Oral  SpO2: 95%  Weight: 253 lb 4.8 oz (114.9 kg)  Height: 5\' 10"  (1.778 m)    Body mass index is 36.34 kg/m.  Physical Exam  Constitutional: Negative for fever or weight change.  Respiratory: Negative for cough and shortness of breath.   Cardiovascular: Negative for chest pain or palpitations.  Gastrointestinal: Negative for abdominal pain, no bowel changes.  Musculoskeletal: Negative for gait problem or joint swelling.  Skin: Negative  for rash.  Neurological: Negative for dizziness or headache.  No other specific complaints in a complete review of systems (except as listed in HPI above).   Recent Results (from the past 2160 hour(s))  POCT HgB A1C     Status: Abnormal   Collection Time: 12/18/20 10:34 AM  Result Value Ref Range   Hemoglobin A1C 7.9 (A) 4.0 - 5.6 %   HbA1c POC (<> result, manual entry)     HbA1c, POC (prediabetic range)     HbA1c, POC (controlled diabetic range)       PHQ2/9: Depression screen [facility] 2/9 12/18/2020 08/18/2020 04/14/2020 12/10/2019 08/05/2019  Decreased Interest 0 0 0 0 0  Down, Depressed, Hopeless 0 0 0 0 0  PHQ - 2 Score 0 0 0 0 0  Altered sleeping 0 - - - 0  Tired, decreased energy 0 - - - 0  Change in appetite 0 - - - 0  Feeling bad or failure about yourself  0 - - - 0  Trouble concentrating 0 - - - 0  Moving slowly or fidgety/restless 0 - - - 0  Suicidal thoughts 0 - - - 0  PHQ-9 Score 0 - - - 0  Difficult doing work/chores Not difficult at all - - - Not difficult at all  Some recent data might be hidden    phq 9 is negative   Fall Risk: Fall Risk  12/18/2020 08/18/2020 04/14/2020 12/10/2019 08/05/2019  Falls in the past year? 1 0 0 0 0  Number falls in past yr: 0 0 0 0 -  Comment - - - - -  Injury with Fall? 0 0 0 0 -  Comment - - - - -   Risk Factor Category  - - - - -  Risk for fall due to : No Fall Risks - - - -  Follow up Falls prevention discussed - - - -      Functional Status Survey: Is the patient deaf or have difficulty hearing?: No Does the patient have difficulty seeing, even when wearing glasses/contacts?: No Does the patient have difficulty concentrating, remembering, or making decisions?: No Does the patient have difficulty walking or climbing stairs?: No Does the patient have difficulty dressing or bathing?: No Does the patient have difficulty doing errands alone such as visiting a doctor's office or shopping?: No    Assessment & Plan  1. Type 2 diabetes mellitus with stage 3a chronic kidney disease, without long-term current use of insulin (HCC)  - POCT HgB A1C - losartan (COZAAR) 100 MG tablet; Take 1 tablet (100 mg total) by mouth daily.  Dispense: 90 tablet; Refill: 1  2. Need for influenza vaccination  - Flu Vaccine QUAD High Dose(Fluad)  3. Dyslipidemia associated with type 2 diabetes mellitus (HCC)  - metFORMIN (GLUCOPHAGE) 1000 MG tablet; Take 1 tablet (1,000 mg total) by mouth 2 (two) times daily with a meal.  Dispense: 180 tablet; Refill: 1  4. Hypertension associated with type 2 diabetes mellitus (HCC)  Controlled with medication  5. Morbid obesity (HCC)  Discussed with the patient the risk posed by an increased BMI. Discussed importance of portion control, calorie counting and at least 150 minutes of physical activity weekly. Avoid sweet beverages and drink more water. Eat at least 6 servings of fruit and vegetables daily    6. Stage 3a chronic kidney disease (HCC)  - losartan (COZAAR) 100 MG tablet; Take 1 tablet (100 mg total) by mouth daily.  Dispense: 90 tablet;  Refill: 1  7. Diabetic peripheral neuropathy associated with type 2 diabetes mellitus (HCC)   8. Vitamin D deficiency  - Vitamin D, Ergocalciferol, (DRISDOL) 1.25 MG (50000 UNIT) CAPS capsule; Take 1 capsule  (50,000 Units total) by mouth every 7 (seven) days.  Dispense: 12 capsule; Refill: 1  9. B12 deficiency  - cyanocobalamin ((VITAMIN B-12)) injection 1,000 mcg  10. Anemia of chronic disease   11. Obstructive apnea   12. Gastroesophageal reflux disease with esophagitis without hemorrhage   13. Insomnia, persistent   14. Gait instability  Likely  multifactorial, he has diabetic neuropathy, lumbar disc disease, and also inner ear dysfunction, seen by ENT and had MRI back in 2014 - he states symptoms not any better but not at worse  15. Vertigo   Stable and chronic

## 2021-04-10 ENCOUNTER — Other Ambulatory Visit: Payer: Self-pay | Admitting: Family Medicine

## 2021-04-10 DIAGNOSIS — E1142 Type 2 diabetes mellitus with diabetic polyneuropathy: Secondary | ICD-10-CM

## 2021-04-10 DIAGNOSIS — G4700 Insomnia, unspecified: Secondary | ICD-10-CM

## 2021-04-20 NOTE — Progress Notes (Signed)
Name: Brent Carroll   MRN: [id number]    DOB: [birth year]   Date:04/21/2021       Progress Note  Subjective  Chief Complaint  Follow Up  HPI  DMII: A1C went up from 6.7 % to 6.9% ,6.4 % ,6.6 % and last visit was  7.9 % .  He has been compliant with medication and drinking more water. He denies polyphagia polyuria or polydipsia. DM with CKI on ARB, he states neuropathic pain on legs has been stable on gabapentin, has dyslipidemia and is taking  Crestor, cannot afford lovaza , last triglycerides was 200's ,last urine micro was normal. He has been cutting down on junk food and since COVID-19 he has lost weight due to lack of appetite   Stress: his son in law died in a tragic MVA 2020-11-17  followed by his wife falling face forward Dec 2022. He states doing a little better now. Still worry about his daughter and grandchildren    Hyperlipidemia: Continue Crestor for now, last triglycerides was 238 ( slightly up) LDL at goal still at 56. HDL low and discussed increasing fish and tree nuts intake . We will recheck labs next visit    OSA: he states Dr. Rutherford Nail ordered  his sleep study, he was unable to tolerate CPAP machine, he also states too expensive to go back for a repeat study. Unchanged    GERD: he has occasional heartburn symptoms and takes Tums very seldom, symptoms are stable Unchanged    Insomnia: he is off Ambien CR, he is taking Seroquel and gabapentin. He is going to bed around 11: 30 pm.. He states only has difficulty falling asleep but staying asleep for at least  8 hours. Continue medications. . He denies any  side effects. He very seldom gets up to void at night and it takes about 10-15 minutes to fall back asleep.   Morbid Obesity: he has multiple co-morbidities and BMI above 35 , weight is trending down , he lost 15 lbs since last visit , he states he had COVID-19 two weeks ago and his appetite was poor, he states appetite is improving now  B12 and Vitamin D deficiency: he is  still taking rx vitamin D but the cost is too high advised to take otc vitamin D 2000 units daily, he is not taking B12 on a regular basis, last level was low and explained importance of compliance   Anemia of chronic disease: we will recheck labs next visit   CKI stage III: explained he needs to avoid NSAID's, drink water and keep diabetes and bp under control Last levels also showed anemia of chronic disease, GFR was better, but has been in the 50's for a while. We will recheck next visit   Gait instability: he has dizziness, uses a cane when out of his house, also has tight hamstring , discussed going back to PT. He has problems with his inner ear problem. He does not want to go back PT because of cost. Seen by ENT in 2014 - Dr. Richardson Landry had evaluation done including MRI brain  HTN: he is taking medication as prescribed, no chest pain or palpitation. BP today is at goal. He has very mild lower extremity edema . Continue current regiment   Patient Active Problem List   Diagnosis Date Noted   Diabetic neuropathy ([place]) 01/05/2017   B12 deficiency 07/08/2016   Dyslipidemia associated with type 2 diabetes mellitus ([place]) 11/12/2015   Diabetes mellitus with neuropathy causing erectile  dysfunction (HCC) 11/12/2015   Vitamin D deficiency 08/14/2015   CN (constipation) 01/07/2015   Hyperlipidemia 01/07/2015   Chronic kidney disease, stage II (mild) 08/25/2014   Narrowing of intervertebral disc space 08/25/2014   Type 2 diabetes mellitus with other diabetic kidney complication (HCC) 08/25/2014   Failure of erection 08/25/2014   Acid reflux 08/25/2014   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   Obstructive apnea 08/25/2014   Benign essential HTN 03/16/2009   Insomnia, persistent 02/10/2009    Past Surgical History:  Procedure Laterality Date   HERNIA REPAIR     HERNIA REPAIR Bilateral    inguinal    TONSILLECTOMY      Family History  Problem Relation Age of Onset   Heart disease Father    COPD  Father     Social History   Tobacco Use   Smoking status: Former    Types: Cigarettes    Quit date: 04/04/1986    Years since quitting: 35.0   Smokeless tobacco: Never  Substance Use Topics   Alcohol use: No    Alcohol/week: 0.0 standard drinks     Current Outpatient Medications:    acetaminophen (TYLENOL) 500 MG tablet, Take 1 tablet (500 mg total) by mouth every 6 (six) hours as needed., Disp: 60 tablet, Rfl: 0   atorvastatin (LIPITOR) 40 MG tablet, TAKE 1 TABLET(40 MG) BY MOUTH DAILY, Disp: 90 tablet, Rfl: 3   Cholecalciferol (VITAMIN D) 50 MCG (2000 UT) CAPS, Take 1 capsule (2,000 Units total) by mouth daily., Disp: 30 capsule, Rfl: 0   Cyanocobalamin (B-12) 1000 MCG SUBL, Place 1 mcg under the tongue daily., Disp: 90 each, Rfl: 1   gabapentin (NEURONTIN) 300 MG capsule, TAKE 2 CAPSULES(600 MG) BY MOUTH AT BEDTIME, Disp: 180 capsule, Rfl: 0   hydrOXYzine (ATARAX) 25 MG tablet, TAKE 1 TABLET(25 MG) BY MOUTH AT BEDTIME, Disp: 90 tablet, Rfl: 0   losartan (COZAAR) 100 MG tablet, Take 1 tablet (100 mg total) by mouth daily., Disp: 90 tablet, Rfl: 1   metFORMIN (GLUCOPHAGE) 1000 MG tablet, Take 1 tablet (1,000 mg total) by mouth 2 (two) times daily with a meal., Disp: 180 tablet, Rfl: 1   QUEtiapine (SEROQUEL) 25 MG tablet, TAKE 1 TABLET(25 MG) BY MOUTH AT BEDTIME, Disp: 90 tablet, Rfl: 0   Tdap (ADACEL) 06-29-13.5 LF-MCG/0.5 injection, Inject 0.5 mLs into the muscle once for 1 dose., Disp: 0.5 mL, Rfl: 0   Zoster Vaccine Adjuvanted (SHINGRIX) injection, Inject 0.5 mLs into the muscle once for 1 dose., Disp: 0.5 mL, Rfl: 1  Allergies  Allergen Reactions   Trazodone And Nefazodone    Trazodone Anxiety    I personally reviewed active problem list, medication list, allergies, family history, social history, health maintenance with the patient/caregiver today.   ROS  Constitutional: Negative for fever or weight change.  Respiratory: Negative for cough and shortness of breath.    Cardiovascular: Negative for chest pain or palpitations.  Gastrointestinal: Negative for abdominal pain, no bowel changes.  Musculoskeletal: positive  for gait problem but no  joint swelling.  Skin: Negative for rash.  Neurological: Negative for dizziness or headache.  No other specific complaints in a complete review of systems (except as listed in HPI above).   Objective  Vitals:   04/21/21 1346  BP: 132/80  Pulse: 91  Resp: 16  SpO2: 99%  Weight: 238 lb (108 kg)  Height: 5\' 9"  (1.753 m)    Body mass index is 35.15 kg/m.  Physical Exam  Constitutional: Patient  appears well-developed and well-nourished. Obese  No distress.  HEENT: head atraumatic, normocephalic, pupils equal and reactive to light, neck supple, throat within normal limits Cardiovascular: Normal rate, regular rhythm and normal heart sounds.  No murmur heard. No BLE edema. Pulmonary/Chest: Effort normal and breath sounds normal. No respiratory distress. Abdominal: Soft.  There is no tenderness. Psychiatric: Patient has a normal mood and affect. behavior is normal. Judgment and thought content normal.   Diabetic Foot Exam: Diabetic Foot Exam - Simple   Simple Foot Form Visual Inspection See comments: Yes Sensation Testing See comments: Yes Pulse Check Posterior Tibialis and Dorsalis pulse intact bilaterally: Yes Comments Thick toenails, failed monofilament exam on heels only       PHQ2/9: Depression screen [facility] 2/9 04/21/2021 12/18/2020 08/18/2020 04/14/2020 12/10/2019  Decreased Interest 0 0 0 0 0  Down, Depressed, Hopeless 0 0 0 0 0  PHQ - 2 Score 0 0 0 0 0  Altered sleeping 0 0 - - -  Tired, decreased energy 0 0 - - -  Change in appetite 0 0 - - -  Feeling bad or failure about yourself  0 0 - - -  Trouble concentrating 0 0 - - -  Moving slowly or fidgety/restless 0 0 - - -  Suicidal thoughts 0 0 - - -  PHQ-9 Score 0 0 - - -  Difficult doing work/chores - Not difficult at all - - -  Some  recent data might be hidden    phq 9 is negative   Fall Risk: Fall Risk  04/21/2021 12/18/2020 08/18/2020 04/14/2020 12/10/2019  Falls in the past year? 0 1 0 0 0  Number falls in past yr: 0 0 0 0 0  Comment - - - - -  Injury with Fall? 0 0 0 0 0  Comment - - - - -  Risk Factor Category  - - - - -  Risk for fall due to : No Fall Risks No Fall Risks - - -  Follow up Falls prevention discussed Falls prevention discussed - - -      Functional Status Survey: Is the patient deaf or have difficulty hearing?: No Does the patient have difficulty seeing, even when wearing glasses/contacts?: No Does the patient have difficulty concentrating, remembering, or making decisions?: No Does the patient have difficulty walking or climbing stairs?: Yes Does the patient have difficulty dressing or bathing?: No Does the patient have difficulty doing errands alone such as visiting a doctor's office or shopping?: No    Assessment & Plan  1. Type 2 diabetes mellitus with stage 3a chronic kidney disease, without long-term current use of insulin (HCC)  - HgB A1c - HM Diabetes Foot Exam  2. Stage 3a chronic kidney disease ([place])  Recheck labs next visit   3. Dyslipidemia associated with type 2 diabetes mellitus ([place])   4. Diabetic peripheral neuropathy associated with type 2 diabetes mellitus ([place])   5. Hypertension associated with type 2 diabetes mellitus ([place])   6. Morbid obesity ([place])  He is losing weight now   7. Vitamin D deficiency  - Cholecalciferol (VITAMIN D) 50 MCG (2000 UT) CAPS; Take 1 capsule (2,000 Units total) by mouth daily.  Dispense: 30 capsule; Refill: 0  8. Obstructive apnea   9. B12 deficiency   10. Colon cancer screening  - Ambulatory referral to Gastroenterology  11. Anemia of chronic disease   12. Gastroesophageal reflux disease with esophagitis without hemorrhage   13. Gait instability  14. Insomnia, persistent   15. Need for Tdap  vaccination  - Tdap (ADACEL) 06-29-13.5 LF-MCG/0.5 injection; Inject 0.5 mLs into the muscle once for 1 dose.  Dispense: 0.5 mL; Refill: 0  16. Need for shingles vaccine  - Zoster Vaccine Adjuvanted [facility]) injection; Inject 0.5 mLs into the muscle once for 1 dose.  Dispense: 0.5 mL; Refill: 1

## 2021-04-21 ENCOUNTER — Ambulatory Visit (INDEPENDENT_AMBULATORY_CARE_PROVIDER_SITE_OTHER): Payer: PPO | Admitting: Family Medicine

## 2021-04-21 ENCOUNTER — Telehealth: Payer: Self-pay

## 2021-04-21 ENCOUNTER — Encounter: Payer: Self-pay | Admitting: Family Medicine

## 2021-04-21 VITALS — BP 132/80 | HR 91 | Resp 16 | Ht 69.0 in | Wt 238.0 lb

## 2021-04-21 DIAGNOSIS — G4700 Insomnia, unspecified: Secondary | ICD-10-CM

## 2021-04-21 DIAGNOSIS — E1169 Type 2 diabetes mellitus with other specified complication: Secondary | ICD-10-CM

## 2021-04-21 DIAGNOSIS — Z1211 Encounter for screening for malignant neoplasm of colon: Secondary | ICD-10-CM | POA: Diagnosis not present

## 2021-04-21 DIAGNOSIS — N1831 Chronic kidney disease, stage 3a: Secondary | ICD-10-CM

## 2021-04-21 DIAGNOSIS — E1142 Type 2 diabetes mellitus with diabetic polyneuropathy: Secondary | ICD-10-CM | POA: Diagnosis not present

## 2021-04-21 DIAGNOSIS — E538 Deficiency of other specified B group vitamins: Secondary | ICD-10-CM | POA: Diagnosis not present

## 2021-04-21 DIAGNOSIS — E1159 Type 2 diabetes mellitus with other circulatory complications: Secondary | ICD-10-CM

## 2021-04-21 DIAGNOSIS — E1122 Type 2 diabetes mellitus with diabetic chronic kidney disease: Secondary | ICD-10-CM | POA: Diagnosis not present

## 2021-04-21 DIAGNOSIS — I152 Hypertension secondary to endocrine disorders: Secondary | ICD-10-CM

## 2021-04-21 DIAGNOSIS — K2100 Gastro-esophageal reflux disease with esophagitis, without bleeding: Secondary | ICD-10-CM

## 2021-04-21 DIAGNOSIS — E559 Vitamin D deficiency, unspecified: Secondary | ICD-10-CM

## 2021-04-21 DIAGNOSIS — D638 Anemia in other chronic diseases classified elsewhere: Secondary | ICD-10-CM | POA: Diagnosis not present

## 2021-04-21 DIAGNOSIS — R2681 Unsteadiness on feet: Secondary | ICD-10-CM

## 2021-04-21 DIAGNOSIS — G4733 Obstructive sleep apnea (adult) (pediatric): Secondary | ICD-10-CM

## 2021-04-21 DIAGNOSIS — Z23 Encounter for immunization: Secondary | ICD-10-CM

## 2021-04-21 DIAGNOSIS — E785 Hyperlipidemia, unspecified: Secondary | ICD-10-CM

## 2021-04-21 MED ORDER — TETANUS-DIPHTH-ACELL PERTUSSIS 5-2-15.5 LF-MCG/0.5 IM SUSP: 0.5000 mL | Freq: Once | INTRAMUSCULAR | 0 refills | Status: AC

## 2021-04-21 MED ORDER — VITAMIN D 50 MCG (2000 UT) PO CAPS: 1.0000 | ORAL_CAPSULE | Freq: Every day | ORAL | 0 refills | Status: AC

## 2021-04-21 MED ORDER — SHINGRIX 50 MCG/0.5ML IM SUSR: 0.5000 mL | Freq: Once | INTRAMUSCULAR | 1 refills | Status: AC

## 2021-04-21 NOTE — Telephone Encounter (Signed)
CALLED PATIENT NO ANSWER LEFT VOICEMAIL FOR A CALL BACK ? ?

## 2021-04-22 ENCOUNTER — Telehealth: Payer: Self-pay

## 2021-04-22 LAB — HEMOGLOBIN A1C
Hgb A1c MFr Bld: 6.9 % of total Hgb — ABNORMAL HIGH (ref <5.7)
Mean Plasma Glucose: 151 mg/dL
eAG (mmol/L): 8.4 mmol/L

## 2021-04-22 NOTE — Telephone Encounter (Signed)
CALLED PATIENT NO ANSWER LEFT VOICEMAIL FOR A CALL BACK °Letter sent °

## 2021-06-30 ENCOUNTER — Other Ambulatory Visit: Payer: Self-pay | Admitting: Family Medicine

## 2021-06-30 DIAGNOSIS — I152 Hypertension secondary to endocrine disorders: Secondary | ICD-10-CM

## 2021-06-30 DIAGNOSIS — N1831 Chronic kidney disease, stage 3a: Secondary | ICD-10-CM

## 2021-06-30 DIAGNOSIS — E1122 Type 2 diabetes mellitus with diabetic chronic kidney disease: Secondary | ICD-10-CM

## 2021-06-30 DIAGNOSIS — E1169 Type 2 diabetes mellitus with other specified complication: Secondary | ICD-10-CM

## 2021-07-07 ENCOUNTER — Other Ambulatory Visit: Payer: Self-pay | Admitting: Family Medicine

## 2021-07-07 DIAGNOSIS — G4700 Insomnia, unspecified: Secondary | ICD-10-CM

## 2021-07-07 DIAGNOSIS — E1142 Type 2 diabetes mellitus with diabetic polyneuropathy: Secondary | ICD-10-CM

## 2021-08-17 NOTE — Progress Notes (Unsigned)
Name: Brent Carroll   MRN: [id number]    DOB: Jan 06, 1946   Date:08/18/2021       Progress Note  Subjective  Chief Complaint  Follow Up  HPI  DMII: A1C went up from 6.7 % to 6.9% ,6.4 % ,6.6 % , up to 7.9 % down to 6.9 % and today is up again at 7.4 %.  He has been compliant with medication and drinking more water. He denies polyphagia polyuria or polydipsia. DM with CKI on ARB, he states neuropathic pain on legs has been stable on gabapentin, has dyslipidemia and is taking  Crestor, cannot afford lovaza , last triglycerides was 200's ,last urine micro was normal. He has been cutting down on junk food , but A1C is trending up again. Reviewed a low carb diet with patient again   Stress: his son in law died in a tragic MVA 30-Nov-2020  followed by his wife falling face forward Dec 2022. He states they are adjusting, doing better now    Hyperlipidemia: Continue Crestor for now, last triglycerides was 238 ( slightly up) LDL at goal still at 56. HDL low and discussed increasing fish and tree nuts intake . We will recheck labs today    OSA: he states Dr. Rutherford Nail ordered  his sleep study, he was unable to tolerate CPAP machine, he also states too expensive to go back for a repeat study.  Unchanged    GERD: he has occasional heartburn symptoms and takes Tums very seldom, He states last week he felt bloated for a day but resolved now    Insomnia: he is off Ambien CR, he is taking Seroquel , hydroxyzine and gabapentin. He is going to bed around 11: 30 pm.. He states only has difficulty falling asleep but staying asleep for at least  8 hours. Continue medications. Marland Kitchen He is under less stress now and advised him to try stopping hydroxyzine    Morbid Obesity: he has multiple co-morbidities and BMI above 35 , weight was  trending down , he has lost 15 lbs and was down to 238 lbs,  he states he had COVID prior to his last visit and it caused suppression of appetite.   B12 and Vitamin D deficiency: he is still  taking rx vitamin D but the cost is too high advised to take otc vitamin D 2000 units daily, he is not taking B12 on a regular basis, last level was low and explained importance of compliance We will recheck B12 today, last vitamin D level was at goal and he will continue supplementation   Anemia of chronic disease: we will recheck labs today, he also has B12 deficiency and has not been taking supplements lately    CKI stage III: explained he needs to avoid NSAID's, drink water and keep diabetes and bp under control Last levels also showed anemia of chronic disease, GFR was better, but has been in the 50's for a while. We will recheck next visit   Gait instability: he has dizziness, uses a cane when out of his house He has problems with his inner ear problem. He does not want to go back PT because of cost. Seen by ENT in 2014 - Dr. Richardson Landry had evaluation done including MRI brain. Unchanged   HTN: he is taking medication as prescribed, no chest pain or palpitation. BP today is at goal. 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) 11/12/2015   Vitamin D deficiency 08/14/2015   CN (constipation) 01/07/2015   Hyperlipidemia 01/07/2015   Chronic kidney disease, stage II (mild) 08/25/2014   Narrowing of intervertebral disc space 08/25/2014   Type 2 diabetes mellitus with other diabetic kidney complication (HCC) 08/25/2014   Failure of erection 08/25/2014   Acid reflux 08/25/2014   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   Obstructive apnea 08/25/2014   Benign essential HTN 03/16/2009   Insomnia, persistent 02/10/2009    Past Surgical History:  Procedure Laterality Date   HERNIA REPAIR     HERNIA REPAIR Bilateral    inguinal     TONSILLECTOMY      Family History  Problem Relation Age of Onset   Heart disease Father    COPD Father     Social History   Tobacco Use   Smoking status: Former    Types: Cigarettes    Quit date: 04/04/1986    Years since quitting: 35.3   Smokeless tobacco: Never  Substance Use Topics   Alcohol use: No    Alcohol/week: 0.0 standard drinks of alcohol     Current Outpatient Medications:    acetaminophen (TYLENOL) 500 MG tablet, Take 1 tablet (500 mg total) by mouth every 6 (six) hours as needed., Disp: 60 tablet, Rfl: 0   atorvastatin (LIPITOR) 40 MG tablet, TAKE 1 TABLET(40 MG) BY MOUTH DAILY, Disp: 90 tablet, Rfl: 3   Cholecalciferol (VITAMIN D) 50 MCG (2000 UT) CAPS, Take 1 capsule (2,000 Units total) by mouth daily., Disp: 30 capsule, Rfl: 0   Cyanocobalamin (B-12) 1000 MCG SUBL, Place 1 mcg under the tongue daily., Disp: 90 each, Rfl: 1   gabapentin (NEURONTIN) 300 MG capsule, TAKE 2 CAPSULES(600 MG) BY MOUTH AT BEDTIME, Disp: 180 capsule, Rfl: 0   hydrOXYzine (ATARAX) 25 MG tablet, TAKE 1 TABLET(25 MG) BY MOUTH AT BEDTIME, Disp: 90 tablet, Rfl: 0   losartan (COZAAR) 100 MG tablet, TAKE 1 TABLET(100 MG) BY MOUTH DAILY, Disp: 90 tablet, Rfl: 0   metFORMIN (GLUCOPHAGE) 1000 MG tablet, TAKE 1 TABLET(1000 MG) BY MOUTH TWICE DAILY WITH A MEAL, Disp: 180 tablet, Rfl: 0   QUEtiapine (SEROQUEL) 25 MG tablet, TAKE 1 TABLET(25 MG) BY MOUTH AT BEDTIME, Disp: 90 tablet, Rfl: 0  Allergies  Allergen Reactions   Trazodone And Nefazodone    Trazodone Anxiety    I personally reviewed active problem list, medication list, allergies, family history, social history, health maintenance with the patient/caregiver today.   ROS  Constitutional: Negative for fever or weight change.  Respiratory: Negative for cough and shortness of breath.   Cardiovascular: Negative for chest pain or palpitations.  Gastrointestinal: Negative for abdominal pain, no bowel changes.  Musculoskeletal: Negative for  gait problem or joint swelling.  Skin: Negative for rash.  Neurological: Negative for dizziness or headache.  No other specific complaints in a complete review of systems (except as listed in HPI above).   Objective  Vitals:   08/18/21 1403  BP: 124/76  Pulse: 92  Resp: 16  SpO2: 98%  Weight: 241 lb (109.3 kg)  Height: 5\' 9"  (1.753 m)    Body mass index is 35.59 kg/m.  Physical Exam  Constitutional: Patient appears well-developed and well-nourished. Obese  No distress.  HEENT: head atraumatic, normocephalic, pupils equal and reactive to light, neck supple Cardiovascular: Normal rate, regular  rhythm and normal heart sounds.  No murmur heard. BLE  ankle edema. Pulmonary/Chest: Effort normal and breath sounds normal. No respiratory distress. Abdominal: Soft.  There is no tenderness. Psychiatric: Patient has a normal mood and affect. behavior is normal. Judgment and thought content normal.    PHQ2/9:    08/18/2021    2:04 PM 04/21/2021    1:46 PM 12/18/2020   10:16 AM 08/18/2020   10:00 AM 04/14/2020   10:30 AM  Depression screen PHQ 2/9  Decreased Interest 0 0 0 0 0  Down, Depressed, Hopeless 0 0 0 0 0  PHQ - 2 Score 0 0 0 0 0  Altered sleeping  0 0    Tired, decreased energy  0 0    Change in appetite  0 0    Feeling bad or failure about yourself   0 0    Trouble concentrating  0 0    Moving slowly or fidgety/restless  0 0    Suicidal thoughts  0 0    PHQ-9 Score  0 0    Difficult doing work/chores   Not difficult at all      phq 9 is negative   Fall Risk:    08/18/2021    2:04 PM 04/21/2021    1:45 PM 12/18/2020   10:15 AM 08/18/2020   10:00 AM 04/14/2020   10:29 AM  Fall Risk   Falls in the past year? 0 0 1 0 0  Number falls in past yr: 0 0 0 0 0  Injury with Fall? 0 0 0 0 0  Risk for fall due to : Impaired balance/gait No Fall Risks No Fall Risks    Follow up Falls prevention discussed Falls prevention discussed Falls prevention discussed         Functional Status Survey: Is the patient deaf or have difficulty hearing?: No Does the patient have difficulty seeing, even when wearing glasses/contacts?: No Does the patient have difficulty concentrating, remembering, or making decisions?: No Does the patient have difficulty walking or climbing stairs?: Yes Does the patient have difficulty dressing or bathing?: No Does the patient have difficulty doing errands alone such as visiting a doctor's office or shopping?: No    Assessment & Plan  1. Type 2 diabetes mellitus with stage 3a chronic kidney disease, without long-term current use of insulin (HCC)  - POCT HgB A1C - losartan (COZAAR) 100 MG tablet; TAKE 1 TABLET(100 MG) BY MOUTH DAILY  Dispense: 90 tablet; Refill: 0 - Microalbumin / creatinine urine ratio - COMPLETE METABOLIC PANEL WITH GFR  2. Stage 3a chronic kidney disease (HCC)  - losartan (COZAAR) 100 MG tablet; TAKE 1 TABLET(100 MG) BY MOUTH DAILY  Dispense: 90 tablet; Refill: 0 - COMPLETE METABOLIC PANEL WITH GFR - CBC with Differential/Platelet  3. Hypertension associated with type 2 diabetes mellitus (HCC)  - losartan (COZAAR) 100 MG tablet; TAKE 1 TABLET(100 MG) BY MOUTH DAILY  Dispense: 90 tablet; Refill: 0  4. Dyslipidemia associated with type 2 diabetes mellitus (HCC)  - metFORMIN (GLUCOPHAGE) 1000 MG tablet; TAKE 1 TABLET(1000 MG) BY MOUTH TWICE DAILY WITH A MEAL  Dispense: 180 tablet; Refill: 0 - Lipid panel  5. Diabetic peripheral neuropathy associated with type 2 diabetes mellitus (HCC)  - gabapentin (NEURONTIN) 300 MG capsule; TAKE 2 CAPSULES(600 MG) BY MOUTH AT BEDTIME  Dispense: 180 capsule; Refill: 0  6. Vitamin D deficiency   7. Insomnia, persistent  - QUEtiapine (SEROQUEL) 25 MG tablet; TAKE 1 TABLET(25 MG)  BY MOUTH AT BEDTIME  Dispense: 90 tablet; Refill: 0  8. B12 deficiency  - B12 and Folate Panel  We will give him B12 injections today  9. Anemia of chronic disease  Recheck  level   10. Obstructive apnea

## 2021-08-18 ENCOUNTER — Ambulatory Visit (INDEPENDENT_AMBULATORY_CARE_PROVIDER_SITE_OTHER): Payer: PPO | Admitting: Family Medicine

## 2021-08-18 ENCOUNTER — Encounter: Payer: Self-pay | Admitting: Family Medicine

## 2021-08-18 VITALS — BP 124/76 | HR 92 | Resp 16 | Ht 69.0 in | Wt 241.0 lb

## 2021-08-18 DIAGNOSIS — E1169 Type 2 diabetes mellitus with other specified complication: Secondary | ICD-10-CM

## 2021-08-18 DIAGNOSIS — E785 Hyperlipidemia, unspecified: Secondary | ICD-10-CM | POA: Diagnosis not present

## 2021-08-18 DIAGNOSIS — E559 Vitamin D deficiency, unspecified: Secondary | ICD-10-CM | POA: Diagnosis not present

## 2021-08-18 DIAGNOSIS — I152 Hypertension secondary to endocrine disorders: Secondary | ICD-10-CM

## 2021-08-18 DIAGNOSIS — G4700 Insomnia, unspecified: Secondary | ICD-10-CM

## 2021-08-18 DIAGNOSIS — E1142 Type 2 diabetes mellitus with diabetic polyneuropathy: Secondary | ICD-10-CM

## 2021-08-18 DIAGNOSIS — E1159 Type 2 diabetes mellitus with other circulatory complications: Secondary | ICD-10-CM

## 2021-08-18 DIAGNOSIS — N1831 Chronic kidney disease, stage 3a: Secondary | ICD-10-CM | POA: Diagnosis not present

## 2021-08-18 DIAGNOSIS — E538 Deficiency of other specified B group vitamins: Secondary | ICD-10-CM | POA: Diagnosis not present

## 2021-08-18 DIAGNOSIS — E1122 Type 2 diabetes mellitus with diabetic chronic kidney disease: Secondary | ICD-10-CM | POA: Diagnosis not present

## 2021-08-18 DIAGNOSIS — D638 Anemia in other chronic diseases classified elsewhere: Secondary | ICD-10-CM | POA: Diagnosis not present

## 2021-08-18 DIAGNOSIS — G4733 Obstructive sleep apnea (adult) (pediatric): Secondary | ICD-10-CM

## 2021-08-18 LAB — POCT GLYCOSYLATED HEMOGLOBIN (HGB A1C): Hemoglobin A1C: 7.4 % — AB (ref 4.0–5.6)

## 2021-08-18 MED ORDER — B-12 1000 MCG SL SUBL: 1.0000 ug | SUBLINGUAL_TABLET | Freq: Every day | SUBLINGUAL | 1 refills | Status: AC

## 2021-08-18 MED ORDER — GABAPENTIN 300 MG PO CAPS: ORAL_CAPSULE | ORAL | 0 refills | Status: DC

## 2021-08-18 MED ORDER — CYANOCOBALAMIN 1000 MCG/ML IJ SOLN
1000.0000 ug | Freq: Once | INTRAMUSCULAR | Status: AC
  Administered 2021-08-18: 1000 ug via INTRAMUSCULAR

## 2021-08-18 MED ORDER — LOSARTAN POTASSIUM 100 MG PO TABS: ORAL_TABLET | ORAL | 0 refills | Status: DC

## 2021-08-18 MED ORDER — QUETIAPINE FUMARATE 25 MG PO TABS: ORAL_TABLET | ORAL | 0 refills | Status: DC

## 2021-08-18 MED ORDER — METFORMIN HCL 1000 MG PO TABS: ORAL_TABLET | ORAL | 0 refills | Status: DC

## 2021-08-19 LAB — CBC WITH DIFFERENTIAL/PLATELET
Absolute Monocytes: 431 cells/uL (ref 200–950)
Basophils Absolute: 37 cells/uL (ref 0–200)
Basophils Relative: 0.5 %
Eosinophils Absolute: 234 cells/uL (ref 15–500)
Eosinophils Relative: 3.2 %
HCT: 42.4 % (ref 38.5–50.0)
Hemoglobin: 14.4 g/dL (ref 13.2–17.1)
Lymphs Abs: 1628 cells/uL (ref 850–3900)
MCH: 30.5 pg (ref 27.0–33.0)
MCHC: 34 g/dL (ref 32.0–36.0)
MCV: 89.8 fL (ref 80.0–100.0)
MPV: 10.2 fL (ref 7.5–12.5)
Monocytes Relative: 5.9 %
Neutro Abs: 4971 cells/uL (ref 1500–7800)
Neutrophils Relative %: 68.1 %
Platelets: 214 10*3/uL (ref 140–400)
RBC: 4.72 10*6/uL (ref 4.20–5.80)
RDW: 12.7 % (ref 11.0–15.0)
Total Lymphocyte: 22.3 %
WBC: 7.3 10*3/uL (ref 3.8–10.8)

## 2021-08-19 LAB — COMPLETE METABOLIC PANEL WITH GFR
AG Ratio: 1.7 (calc) (ref 1.0–2.5)
ALT: 35 U/L (ref 9–46)
AST: 30 U/L (ref 10–35)
Albumin: 4.2 g/dL (ref 3.6–5.1)
Alkaline phosphatase (APISO): 64 U/L (ref 35–144)
BUN/Creatinine Ratio: 12 (calc) (ref 6–22)
BUN: 18 mg/dL (ref 7–25)
CO2: 19 mmol/L — ABNORMAL LOW (ref 20–32)
Calcium: 9.4 mg/dL (ref 8.6–10.3)
Chloride: 108 mmol/L (ref 98–110)
Creat: 1.54 mg/dL — ABNORMAL HIGH (ref 0.70–1.28)
Globulin: 2.5 g/dL (calc) (ref 1.9–3.7)
Glucose, Bld: 312 mg/dL — ABNORMAL HIGH (ref 65–99)
Potassium: 4 mmol/L (ref 3.5–5.3)
Sodium: 141 mmol/L (ref 135–146)
Total Bilirubin: 0.8 mg/dL (ref 0.2–1.2)
Total Protein: 6.7 g/dL (ref 6.1–8.1)
eGFR: 47 mL/min/{1.73_m2} — ABNORMAL LOW (ref >=60)

## 2021-08-19 LAB — LIPID PANEL
Cholesterol: 150 mg/dL (ref <200)
HDL: 31 mg/dL — ABNORMAL LOW (ref >=40)
Non-HDL Cholesterol (Calc): 119 mg/dL (calc) (ref <130)
Total CHOL/HDL Ratio: 4.8 (calc) (ref <5.0)
Triglycerides: 406 mg/dL — ABNORMAL HIGH (ref <150)

## 2021-08-19 LAB — B12 AND FOLATE PANEL
Folate: 8.3 ng/mL
Vitamin B-12: 280 pg/mL (ref 200–1100)

## 2021-09-09 DIAGNOSIS — E119 Type 2 diabetes mellitus without complications: Secondary | ICD-10-CM | POA: Diagnosis not present

## 2021-09-09 LAB — HM DIABETES EYE EXAM

## 2021-12-01 NOTE — Progress Notes (Signed)
[facility] Quality Team Note  Name: Brent Carroll Date of Birth: [birth year] MRN: [id number] Date: 12/01/2021  [facility] Quality Team has reviewed this patient's chart, please see recommendations below:  [facility] Quality Other; (EED GAP- PATIENT DUE FOR DIABETIC RETINOPATHY SCREENING. NEXT EVENT 11/7 9-1230PM [facility] OUTREACH UNSUCCESSFUL. PATIENT HAS APPT WITH PCP 12/20/2021 PLEASE OFFER SCHEDULING TO PATIENT)

## 2021-12-17 NOTE — Progress Notes (Unsigned)
Name: Brent Carroll   MRN: [id number]    DOB: May 29, 1945   Date:12/20/2021       Progress Note  Subjective  Chief Complaint  Follow Up  HPI  DMII: A1C went up from 6.7 % to 6.9% ,6.4 % ,6.6 % , up to 7.9 % down to 6.9 % up to 7.4 % and today is down to 6.6 % today.  He has been compliant with medication and drinking more water. He denies polyphagia polyuria or polydipsia. DM with CKI on ARB, he states neuropathic pain on legs has been stable on gabapentin, has dyslipidemia and is taking  Crestor, cannot afford lovaza , last triglycerides was 200's he is due for urine micro . He still eats biscuits from [employer] a few times a week . They don't cook at home and get food at local grocery  store  Stress: his son in law died in a tragic MVA 2020-11-05  followed by his wife falling face forward Dec 2022. He states they are adjusting, he is doing okay now, no longer taking hydroxyzine    Hyperlipidemia: Continue Crestor for now, last triglycerides was up to 406 and unable to calculate LDL . HDL low and discussed increasing fish and tree nuts intake . OSA: he states Dr. Rutherford Nail ordered  his sleep study, he was unable to tolerate CPAP machine, he also states too expensive to go back for a repeat study. Explained to him the risk of heart attacks and strokes    GERD: he has occasional heartburn symptoms and takes Tums very seldom, He states last week he felt bloated for a day but resolved now    Insomnia: he is off Ambien CR, he is taking Seroquel  and gabapentin. He is going to bed around 11:30 pm.. He states only has difficulty falling asleep but staying asleep for at least  8 hours. Continue medications.    Morbid Obesity: he has multiple co-morbidities and BMI above 35 , weight was  trending down , he has lost 15 lbs and was down to 238 lbs,  today weight is up to 244.5 lbs today.   B12 and Vitamin D deficiency: he has not been taking supplements due to cost of otc supplements, discussed B12  injections. Discussed getting it from a whole sale place  Anemia of chronic disease: he agrees in getting B12 injections here monthly     CKI stage III: explained he needs to avoid NSAID's, drink water and keep diabetes and bp under control Last levels also showed anemia of chronic disease, GFR 47. Continue Losartan, discussed SGL-2 agonists but he is afraid of cost   Gait instability: he has dizziness, uses a cane when out of his house He has problems with his inner ear problem. He does not want to go back PT because of cost. Seen by ENT in 2014 - Dr. Richardson Landry had evaluation done including MRI brain. He states unchanged   HTN: he is taking medication as prescribed, no chest pain or palpitation. BP today is at goal. He has very mild lower extremity edema that has been stable.   Patient Active Problem List   Diagnosis Date Noted   Stage 3a chronic kidney disease ([place]) 08/18/2021   Hypertension associated with type 2 diabetes mellitus ([place]) 08/18/2021   Type 2 diabetes mellitus with stage 3a chronic kidney disease, without long-term current use of insulin ([place]) 08/18/2021   Diabetic peripheral neuropathy associated with type 2 diabetes mellitus ([place]) 08/18/2021   Anemia of  chronic disease 08/18/2021   Diabetic neuropathy ([place]) 01/05/2017   B12 deficiency 07/08/2016   Dyslipidemia associated with type 2 diabetes mellitus ([place]) 11/12/2015   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 ([place]) 11/12/2015   Vitamin D deficiency 08/14/2015   CN (constipation) 01/07/2015   Hyperlipidemia 01/07/2015   Narrowing of intervertebral disc space 08/25/2014   Type 2 diabetes mellitus with other diabetic kidney complication ([place]) 0000000   Failure of erection 08/25/2014   Acid reflux 08/25/2014   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   Obstructive apnea 08/25/2014   Benign essential HTN 03/16/2009   Insomnia, persistent 02/10/2009    Past Surgical History:  Procedure Laterality Date    HERNIA REPAIR     HERNIA REPAIR Bilateral    inguinal    TONSILLECTOMY      Family History  Problem Relation Age of Onset   Heart disease Father    COPD Father     Social History   Tobacco Use   Smoking status: Former    Types: Cigarettes    Quit date: 04/04/1986    Years since quitting: 35.7   Smokeless tobacco: Never  Substance Use Topics   Alcohol use: No    Alcohol/week: 0.0 standard drinks of alcohol     Current Outpatient Medications:    atorvastatin (LIPITOR) 40 MG tablet, TAKE 1 TABLET(40 MG) BY MOUTH DAILY, Disp: 90 tablet, Rfl: 3   Cholecalciferol (VITAMIN D) 50 MCG (2000 UT) CAPS, Take 1 capsule (2,000 Units total) by mouth daily., Disp: 30 capsule, Rfl: 0   Cyanocobalamin (B-12) 1000 MCG SUBL, Place 1 mcg under the tongue daily., Disp: 90 tablet, Rfl: 1   gabapentin (NEURONTIN) 300 MG capsule, TAKE 2 CAPSULES(600 MG) BY MOUTH AT BEDTIME, Disp: 180 capsule, Rfl: 0   losartan (COZAAR) 100 MG tablet, TAKE 1 TABLET(100 MG) BY MOUTH DAILY, Disp: 90 tablet, Rfl: 0   metFORMIN (GLUCOPHAGE) 1000 MG tablet, TAKE 1 TABLET(1000 MG) BY MOUTH TWICE DAILY WITH A MEAL, Disp: 180 tablet, Rfl: 0   QUEtiapine (SEROQUEL) 25 MG tablet, TAKE 1 TABLET(25 MG) BY MOUTH AT BEDTIME, Disp: 90 tablet, Rfl: 0   acetaminophen (TYLENOL) 500 MG tablet, Take 1 tablet (500 mg total) by mouth every 6 (six) hours as needed. (Patient not taking: Reported on 12/20/2021), Disp: 60 tablet, Rfl: 0  Allergies  Allergen Reactions   Trazodone And Nefazodone    Trazodone Anxiety    I personally reviewed active problem list, medication list, allergies, family history, social history, health maintenance with the patient/caregiver today.   ROS  Constitutional: Negative for fever , positive for mild  weight change.  Respiratory: Negative for cough and shortness of breath.   Cardiovascular: Negative for chest pain or palpitations.  Gastrointestinal: Negative for abdominal pain, no bowel changes.   Musculoskeletal: Negative for gait problem or joint swelling.  Skin: Negative for rash.  Neurological: positive  for mild  dizziness but no headache.  No other specific complaints in a complete review of systems (except as listed in HPI above).   Objective  Vitals:   12/20/21 1412  BP: 128/72  Pulse: 90  Resp: 18  Temp: 98.1 F (36.7 C)  TempSrc: Oral  SpO2: 100%  Weight: 244 lb 8 oz (110.9 kg)  Height: 5' 9.5" (1.765 m)    Body mass index is 35.59 kg/m.  Physical Exam  Constitutional: Patient appears well-developed and well-nourished. Obese  No distress.  HEENT: head atraumatic, normocephalic, pupils equal and reactive to light, neck supple Cardiovascular:  Normal rate, regular rhythm and normal heart sounds.  No murmur heard. No BLE edema. Pulmonary/Chest: Effort normal and breath sounds normal. No respiratory distress. Abdominal: Soft.  There is no tenderness. Psychiatric: Patient has a normal mood and affect. behavior is normal. Judgment and thought content normal.   Recent Results (from the past 2160 hour(s))  POCT HgB A1C     Status: Abnormal   Collection Time: 12/20/21  2:16 PM  Result Value Ref Range   Hemoglobin A1C 6.6 (A) 4.0 - 5.6 %   HbA1c POC (<> result, manual entry)     HbA1c, POC (prediabetic range)     HbA1c, POC (controlled diabetic range)      PHQ2/9:    12/20/2021    2:15 PM 08/18/2021    2:04 PM 04/21/2021    1:46 PM 12/18/2020   10:16 AM 08/18/2020   10:00 AM  Depression screen PHQ 2/9  Decreased Interest 0 0 0 0 0  Down, Depressed, Hopeless 0 0 0 0 0  PHQ - 2 Score 0 0 0 0 0  Altered sleeping 0  0 0   Tired, decreased energy 0  0 0   Change in appetite 0  0 0   Feeling bad or failure about yourself  0  0 0   Trouble concentrating 0  0 0   Moving slowly or fidgety/restless 0  0 0   Suicidal thoughts 0  0 0   PHQ-9 Score 0  0 0   Difficult doing work/chores    Not difficult at all     phq 9 is negative   Fall Risk:    12/20/2021     2:15 PM 08/18/2021    2:04 PM 04/21/2021    1:45 PM 12/18/2020   10:15 AM 08/18/2020   10:00 AM  Fall Risk   Falls in the past year? 0 0 0 1 0  Number falls in past yr:  0 0 0 0  Injury with Fall?  0 0 0 0  Risk for fall due to : No Fall Risks Impaired balance/gait No Fall Risks No Fall Risks   Follow up Falls prevention discussed;Education provided;Falls evaluation completed Falls prevention discussed Falls prevention discussed Falls prevention discussed       Functional Status Survey: Is the patient deaf or have difficulty hearing?: No Does the patient have difficulty seeing, even when wearing glasses/contacts?: No Does the patient have difficulty concentrating, remembering, or making decisions?: No Does the patient have difficulty walking or climbing stairs?: Yes Does the patient have difficulty dressing or bathing?: No Does the patient have difficulty doing errands alone such as visiting a doctor's office or shopping?: No    Assessment & Plan  1. Type 2 diabetes mellitus with stage 3a chronic kidney disease, without long-term current use of insulin (HCC)  - POCT HgB A1C - Urine Microalbumin w/creat. ratio - losartan (COZAAR) 100 MG tablet; TAKE 1 TABLET(100 MG) BY MOUTH DAILY  Dispense: 90 tablet; Refill: 0  2. Morbid obesity ([place])  Discussed with the patient the risk posed by an increased BMI. Discussed importance of portion control, calorie counting and at least 150 minutes of physical activity weekly. Avoid sweet beverages and drink more water. Eat at least 6 servings of fruit and vegetables daily    3. Stage 3a chronic kidney disease (HCC)  - losartan (COZAAR) 100 MG tablet; TAKE 1 TABLET(100 MG) BY MOUTH DAILY  Dispense: 90 tablet; Refill: 0  4. Hypertension associated with type  2 diabetes mellitus (HCC)  - losartan (COZAAR) 100 MG tablet; TAKE 1 TABLET(100 MG) BY MOUTH DAILY  Dispense: 90 tablet; Refill: 0  5. Diabetic peripheral neuropathy associated with type  2 diabetes mellitus (HCC)  - gabapentin (NEURONTIN) 300 MG capsule; TAKE 2 CAPSULES(600 MG) BY MOUTH AT BEDTIME  Dispense: 180 capsule; Refill: 0  6. Dyslipidemia associated with type 2 diabetes mellitus ([place])  On statin therapy but needs to resume lovaza , we will try sending fenofibrate to improve triglycerides since it may be cheaper   7. Colon cancer screening  - Ambulatory referral to Gastroenterology - Fecal Globin By Immunochemistry  8. Need for immunization against influenza  - Flu Vaccine QUAD High Dose(Fluad)  9. Insomnia, persistent  - QUEtiapine (SEROQUEL) 25 MG tablet; TAKE 1 TABLET(25 MG) BY MOUTH AT BEDTIME  Dispense: 90 tablet; Refill: 0  10. Vitamin D deficiency  Discussed cheaper places to get it filled  11. B12 deficiency  - cyanocobalamin (VITAMIN B12) injection 1,000 mcg  12. Anemia of chronic disease   13. Gastroesophageal reflux disease with esophagitis without hemorrhage  Stable  14. Obstructive apnea   15. Gait instability  Still uses a cane when out of the house

## 2021-12-20 ENCOUNTER — Encounter: Payer: Self-pay | Admitting: Family Medicine

## 2021-12-20 ENCOUNTER — Ambulatory Visit (INDEPENDENT_AMBULATORY_CARE_PROVIDER_SITE_OTHER): Payer: PPO | Admitting: Family Medicine

## 2021-12-20 VITALS — BP 128/72 | HR 90 | Temp 98.1°F | Resp 18 | Ht 69.5 in | Wt 244.5 lb

## 2021-12-20 DIAGNOSIS — E1142 Type 2 diabetes mellitus with diabetic polyneuropathy: Secondary | ICD-10-CM

## 2021-12-20 DIAGNOSIS — Z23 Encounter for immunization: Secondary | ICD-10-CM

## 2021-12-20 DIAGNOSIS — D638 Anemia in other chronic diseases classified elsewhere: Secondary | ICD-10-CM

## 2021-12-20 DIAGNOSIS — E1169 Type 2 diabetes mellitus with other specified complication: Secondary | ICD-10-CM | POA: Diagnosis not present

## 2021-12-20 DIAGNOSIS — E538 Deficiency of other specified B group vitamins: Secondary | ICD-10-CM | POA: Diagnosis not present

## 2021-12-20 DIAGNOSIS — G4700 Insomnia, unspecified: Secondary | ICD-10-CM | POA: Diagnosis not present

## 2021-12-20 DIAGNOSIS — G4733 Obstructive sleep apnea (adult) (pediatric): Secondary | ICD-10-CM

## 2021-12-20 DIAGNOSIS — N1831 Chronic kidney disease, stage 3a: Secondary | ICD-10-CM

## 2021-12-20 DIAGNOSIS — Z1211 Encounter for screening for malignant neoplasm of colon: Secondary | ICD-10-CM | POA: Diagnosis not present

## 2021-12-20 DIAGNOSIS — E1159 Type 2 diabetes mellitus with other circulatory complications: Secondary | ICD-10-CM | POA: Diagnosis not present

## 2021-12-20 DIAGNOSIS — E1122 Type 2 diabetes mellitus with diabetic chronic kidney disease: Secondary | ICD-10-CM

## 2021-12-20 DIAGNOSIS — E559 Vitamin D deficiency, unspecified: Secondary | ICD-10-CM | POA: Diagnosis not present

## 2021-12-20 DIAGNOSIS — I152 Hypertension secondary to endocrine disorders: Secondary | ICD-10-CM

## 2021-12-20 DIAGNOSIS — E785 Hyperlipidemia, unspecified: Secondary | ICD-10-CM

## 2021-12-20 DIAGNOSIS — K2100 Gastro-esophageal reflux disease with esophagitis, without bleeding: Secondary | ICD-10-CM | POA: Diagnosis not present

## 2021-12-20 DIAGNOSIS — R2681 Unsteadiness on feet: Secondary | ICD-10-CM

## 2021-12-20 LAB — POCT GLYCOSYLATED HEMOGLOBIN (HGB A1C): Hemoglobin A1C: 6.6 % — AB (ref 4.0–5.6)

## 2021-12-20 MED ORDER — QUETIAPINE FUMARATE 25 MG PO TABS: ORAL_TABLET | ORAL | 0 refills | Status: DC

## 2021-12-20 MED ORDER — METFORMIN HCL ER 750 MG PO TB24: 1500.0000 mg | ORAL_TABLET | Freq: Every day | ORAL | 1 refills | Status: DC

## 2021-12-20 MED ORDER — FENOFIBRATE 145 MG PO TABS: 145.0000 mg | ORAL_TABLET | Freq: Every day | ORAL | 1 refills | Status: DC

## 2021-12-20 MED ORDER — CYANOCOBALAMIN 1000 MCG/ML IJ SOLN
1000.0000 ug | Freq: Once | INTRAMUSCULAR | Status: AC
  Administered 2021-12-20: 1000 ug via INTRAMUSCULAR

## 2021-12-20 MED ORDER — GABAPENTIN 300 MG PO CAPS: ORAL_CAPSULE | ORAL | 0 refills | Status: DC

## 2021-12-20 MED ORDER — LOSARTAN POTASSIUM 100 MG PO TABS: ORAL_TABLET | ORAL | 0 refills | Status: DC

## 2021-12-21 LAB — MICROALBUMIN / CREATININE URINE RATIO
Creatinine, Urine: 124 mg/dL (ref 20–320)
Microalb Creat Ratio: 3 mcg/mg creat (ref <30)
Microalb, Ur: 0.4 mg/dL

## 2022-01-04 ENCOUNTER — Other Ambulatory Visit: Payer: Self-pay | Admitting: Family Medicine

## 2022-01-04 DIAGNOSIS — E1169 Type 2 diabetes mellitus with other specified complication: Secondary | ICD-10-CM

## 2022-03-22 ENCOUNTER — Other Ambulatory Visit: Payer: Self-pay | Admitting: Family Medicine

## 2022-03-22 DIAGNOSIS — G4700 Insomnia, unspecified: Secondary | ICD-10-CM

## 2022-04-07 ENCOUNTER — Other Ambulatory Visit: Payer: Self-pay | Admitting: Family Medicine

## 2022-04-07 DIAGNOSIS — E1142 Type 2 diabetes mellitus with diabetic polyneuropathy: Secondary | ICD-10-CM

## 2022-04-29 NOTE — Progress Notes (Unsigned)
Name: Brent Carroll   MRN: [id number]    DOB: 09/03/1945   Date:05/02/2022       Progress Note  Subjective  Chief Complaint  Follow Up  HPI  DMII: A1C went up from 6.7 % to 6.9% ,6.4 % ,6.6 % , up to 7.9 % down to 6.9 % up to 7.4 % down to down to 6.6 % and toda is 7.8 % , we will add Actos to his regiment - discussed possible side effects   He has been compliant with medication and drinking more water. He denies polyphagia polyuria or polydipsia. DM with CKI on ARB, he states neuropathic pain on legs has been stable on gabapentin, has dyslipidemia and is taking  Crestor, cannot afford lovaza but taking fenofibrate and we will recheck labs today     Hyperlipidemia: Continue Crestor for now, last triglycerides was up to 406 , he is also taking fenofibrate daily and we will recheck level   OSA: he states Dr. Rutherford Nail ordered  his sleep study, he was unable to tolerate CPAP machine, he also states too expensive to go back for a repeat study. Unchanged    GERD: he has occasional heartburn symptoms and takes Tums prn    Insomnia: he is off Ambien CR, he is taking Seroquel  and gabapentin. He is going to bed around 11:30 pm.. Medication helps him fall and stay asleep    Morbid Obesity: he has multiple co-morbidities and BMI above 35 , weight was  trending down , he has lost 15 lbs and was down to 238 lbs,  today weight is down from 244.5 lbs to 238.9 lbs  B12 and Vitamin D deficiency: he states he has been taking supplements now   CKI stage III: explained he needs to avoid NSAID's, drink water and keep diabetes and bp under control Last levels also showed anemia of chronic disease, GFR 47. Continue Losartan, discussed SGL-2 agonists but does not want a medication that is not generic.   Gait instability: he has dizziness, uses a cane when out of his house He has problems with his inner ear problem. He does not want to go back PT because of cost. Seen by ENT in 2014 - Dr. Richardson Landry had evaluation  done including MRI brain. Unchanged   HTN: he is taking medication as prescribed, no chest pain or palpitation. BP today is at goal. He has very mild lower extremity edema that has been stable. Unchanged   Patient Active Problem List   Diagnosis Date Noted   Stage 3a chronic kidney disease ([place]) 08/18/2021   Hypertension associated with type 2 diabetes mellitus ([place]) 08/18/2021   Type 2 diabetes mellitus with stage 3a chronic kidney disease, without long-term current use of insulin ([place]) 08/18/2021   Diabetic peripheral neuropathy associated with type 2 diabetes mellitus ([place]) 08/18/2021   Anemia of chronic disease 08/18/2021   Diabetic neuropathy ([place]) 01/05/2017   B12 deficiency 07/08/2016   Dyslipidemia associated with type 2 diabetes mellitus ([place]) 11/12/2015   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 ([place]) 11/12/2015   Vitamin D deficiency 08/14/2015   CN (constipation) 01/07/2015   Hyperlipidemia 01/07/2015   Narrowing of intervertebral disc space 08/25/2014   Type 2 diabetes mellitus with other diabetic kidney complication ([place]) 0000000   Failure of erection 08/25/2014   Acid reflux 08/25/2014   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   Obstructive apnea 08/25/2014   Benign essential HTN 03/16/2009   Insomnia, persistent 02/10/2009    Past Surgical  History:  Procedure Laterality Date   HERNIA REPAIR     HERNIA REPAIR Bilateral    inguinal    TONSILLECTOMY      Family History  Problem Relation Age of Onset   Heart disease Father    COPD Father     Social History   Tobacco Use   Smoking status: Former    Types: Cigarettes    Quit date: 04/04/1986    Years since quitting: 36.1   Smokeless tobacco: Never  Substance Use Topics   Alcohol use: No    Alcohol/week: 0.0 standard drinks of alcohol     Current Outpatient Medications:    acetaminophen (TYLENOL) 500 MG tablet, Take 1 tablet (500 mg total) by mouth every 6 (six) hours as needed., Disp: 60  tablet, Rfl: 0   atorvastatin (LIPITOR) 40 MG tablet, TAKE 1 TABLET(40 MG) BY MOUTH DAILY, Disp: 90 tablet, Rfl: 3   Cholecalciferol (VITAMIN D) 50 MCG (2000 UT) CAPS, Take 1 capsule (2,000 Units total) by mouth daily., Disp: 30 capsule, Rfl: 0   Cyanocobalamin (B-12) 1000 MCG SUBL, Place 1 mcg under the tongue daily., Disp: 90 tablet, Rfl: 1   gabapentin (NEURONTIN) 300 MG capsule, TAKE 2 CAPSULES(600 MG) BY MOUTH AT BEDTIME, Disp: 180 capsule, Rfl: 0   pioglitazone (ACTOS) 15 MG tablet, Take 1 tablet (15 mg total) by mouth daily., Disp: 90 tablet, Rfl: 1   fenofibrate (TRICOR) 145 MG tablet, Take 1 tablet (145 mg total) by mouth daily. In place of lovaza, Disp: 90 tablet, Rfl: 1   losartan (COZAAR) 100 MG tablet, Take 1 tablet (100 mg total) by mouth daily. TAKE 1 TABLET(100 MG) BY MOUTH DAILY, Disp: 90 tablet, Rfl: 1   metFORMIN (GLUCOPHAGE-XR) 750 MG 24 hr tablet, Take 2 tablets (1,500 mg total) by mouth daily with breakfast., Disp: 180 tablet, Rfl: 1   QUEtiapine (SEROQUEL) 25 MG tablet, Take 1 tablet (25 mg total) by mouth at bedtime., Disp: 90 tablet, Rfl: 0  Allergies  Allergen Reactions   Trazodone And Nefazodone    Trazodone Anxiety    I personally reviewed active problem list, medication list, allergies, family history, social history, health maintenance with the patient/caregiver today.   ROS  Constitutional: Negative for fever or weight change.  Respiratory: Negative for cough and shortness of breath.   Cardiovascular: Negative for chest pain or palpitations.  Gastrointestinal: Negative for abdominal pain, no bowel changes.  Musculoskeletal: positive  for gait problem , he uses a cane, but no joint swelling.  Skin: Negative for rash.  Neurological: Negative for dizziness or headache.  No other specific complaints in a complete review of systems (except as listed in HPI above).   Objective  Vitals:   05/02/22 1315  BP: 126/72  Pulse: 77  Resp: 18  Temp: 97.8 F (36.6  C)  TempSrc: Oral  SpO2: 95%  Weight: 238 lb 14.4 oz (108.4 kg)  Height: 5' 9.5" (1.765 m)    Body mass index is 34.77 kg/m.  Physical Exam  Constitutional: Patient appears well-developed and well-nourished. Obese  No distress.  HEENT: head atraumatic, normocephalic, pupils equal and reactive to light, neck supple Cardiovascular: Normal rate, regular rhythm and normal heart sounds.  No murmur heard. No BLE edema. Pulmonary/Chest: Effort normal and breath sounds normal. No respiratory distress. Abdominal: Soft.  There is no tenderness. Psychiatric: Patient has a normal mood and affect. behavior is normal. Judgment and thought content normal.   Diabetic Foot Exam: Diabetic Foot Exam -  Simple   Simple Foot Form Visual Inspection See comments: Yes Sensation Testing Intact to touch and monofilament testing bilaterally: Yes Pulse Check Posterior Tibialis and Dorsalis pulse intact bilaterally: Yes Comments Thick toenail      PHQ2/9:    05/02/2022    1:16 PM 12/20/2021    2:15 PM 08/18/2021    2:04 PM 04/21/2021    1:46 PM 12/18/2020   10:16 AM  Depression screen PHQ 2/9  Decreased Interest 0 0 0 0 0  Down, Depressed, Hopeless 0 0 0 0 0  PHQ - 2 Score 0 0 0 0 0  Altered sleeping 0 0  0 0  Tired, decreased energy 0 0  0 0  Change in appetite 0 0  0 0  Feeling bad or failure about yourself  0 0  0 0  Trouble concentrating 0 0  0 0  Moving slowly or fidgety/restless 0 0  0 0  Suicidal thoughts 0 0  0 0  PHQ-9 Score 0 0  0 0  Difficult doing work/chores     Not difficult at all    phq 9 is negative   Fall Risk:    05/02/2022    1:16 PM 12/20/2021    2:15 PM 08/18/2021    2:04 PM 04/21/2021    1:45 PM 12/18/2020   10:15 AM  Fall Risk   Falls in the past year? 0 0 0 0 1  Number falls in past yr:   0 0 0  Injury with Fall?   0 0 0  Risk for fall due to : Impaired balance/gait No Fall Risks Impaired balance/gait No Fall Risks No Fall Risks  Follow up Falls prevention  discussed;Education provided;Falls evaluation completed Falls prevention discussed;Education provided;Falls evaluation completed Falls prevention discussed Falls prevention discussed Falls prevention discussed     Assessment & Plan  1. Type 2 diabetes mellitus with stage 3a chronic kidney disease, without long-term current use of insulin (HCC)  - HM Diabetes Foot Exam - POCT HgB A1C - losartan (COZAAR) 100 MG tablet; Take 1 tablet (100 mg total) by mouth daily. TAKE 1 TABLET(100 MG) BY MOUTH DAILY  Dispense: 90 tablet; Refill: 1  2. Stage 3a chronic kidney disease (HCC)  - losartan (COZAAR) 100 MG tablet; Take 1 tablet (100 mg total) by mouth daily. TAKE 1 TABLET(100 MG) BY MOUTH DAILY  Dispense: 90 tablet; Refill: 1 - COMPLETE METABOLIC PANEL WITH GFR - CBC with Differential/Platelet  3. Hypertension associated with type 2 diabetes mellitus (HCC)  - losartan (COZAAR) 100 MG tablet; Take 1 tablet (100 mg total) by mouth daily. TAKE 1 TABLET(100 MG) BY MOUTH DAILY  Dispense: 90 tablet; Refill: 1  4. Dyslipidemia associated with type 2 diabetes mellitus (HCC)  - fenofibrate (TRICOR) 145 MG tablet; Take 1 tablet (145 mg total) by mouth daily. In place of lovaza  Dispense: 90 tablet; Refill: 1 - metFORMIN (GLUCOPHAGE-XR) 750 MG 24 hr tablet; Take 2 tablets (1,500 mg total) by mouth daily with breakfast.  Dispense: 180 tablet; Refill: 1 - pioglitazone (ACTOS) 15 MG tablet; Take 1 tablet (15 mg total) by mouth daily.  Dispense: 90 tablet; Refill: 1 - Lipid panel  5. Diabetic peripheral neuropathy associated with type 2 diabetes mellitus ([place])   6. Insomnia, persistent  - QUEtiapine (SEROQUEL) 25 MG tablet; Take 1 tablet (25 mg total) by mouth at bedtime.  Dispense: 90 tablet; Refill: 0  7. B12 deficiency  - B12  8. Vitamin D deficiency  Continue supplementation   9. Gastroesophageal reflux disease with esophagitis without hemorrhage   10. Morbid obesity ([place])  Discussed  with the patient the risk posed by an increased BMI. Discussed importance of portion control, calorie counting and at least 150 minutes of physical activity weekly. Avoid sweet beverages and drink more water. Eat at least 6 servings of fruit and vegetables daily

## 2022-05-02 ENCOUNTER — Ambulatory Visit (INDEPENDENT_AMBULATORY_CARE_PROVIDER_SITE_OTHER): Payer: PPO | Admitting: Family Medicine

## 2022-05-02 ENCOUNTER — Encounter: Payer: Self-pay | Admitting: Family Medicine

## 2022-05-02 VITALS — BP 126/72 | HR 77 | Temp 97.8°F | Resp 18 | Ht 69.5 in | Wt 238.9 lb

## 2022-05-02 DIAGNOSIS — E1169 Type 2 diabetes mellitus with other specified complication: Secondary | ICD-10-CM | POA: Diagnosis not present

## 2022-05-02 DIAGNOSIS — E538 Deficiency of other specified B group vitamins: Secondary | ICD-10-CM | POA: Diagnosis not present

## 2022-05-02 DIAGNOSIS — E1159 Type 2 diabetes mellitus with other circulatory complications: Secondary | ICD-10-CM

## 2022-05-02 DIAGNOSIS — E785 Hyperlipidemia, unspecified: Secondary | ICD-10-CM

## 2022-05-02 DIAGNOSIS — K2100 Gastro-esophageal reflux disease with esophagitis, without bleeding: Secondary | ICD-10-CM | POA: Diagnosis not present

## 2022-05-02 DIAGNOSIS — I152 Hypertension secondary to endocrine disorders: Secondary | ICD-10-CM

## 2022-05-02 DIAGNOSIS — E559 Vitamin D deficiency, unspecified: Secondary | ICD-10-CM

## 2022-05-02 DIAGNOSIS — N1831 Chronic kidney disease, stage 3a: Secondary | ICD-10-CM

## 2022-05-02 DIAGNOSIS — E1122 Type 2 diabetes mellitus with diabetic chronic kidney disease: Secondary | ICD-10-CM

## 2022-05-02 DIAGNOSIS — D649 Anemia, unspecified: Secondary | ICD-10-CM | POA: Diagnosis not present

## 2022-05-02 DIAGNOSIS — G4700 Insomnia, unspecified: Secondary | ICD-10-CM | POA: Diagnosis not present

## 2022-05-02 DIAGNOSIS — E1142 Type 2 diabetes mellitus with diabetic polyneuropathy: Secondary | ICD-10-CM | POA: Diagnosis not present

## 2022-05-02 DIAGNOSIS — D638 Anemia in other chronic diseases classified elsewhere: Secondary | ICD-10-CM | POA: Diagnosis not present

## 2022-05-02 LAB — POCT GLYCOSYLATED HEMOGLOBIN (HGB A1C): Hemoglobin A1C: 7.8 % — AB (ref 4.0–5.6)

## 2022-05-02 MED ORDER — QUETIAPINE FUMARATE 25 MG PO TABS: 25.0000 mg | ORAL_TABLET | Freq: Every day | ORAL | 0 refills | Status: DC

## 2022-05-02 MED ORDER — FENOFIBRATE 145 MG PO TABS: 145.0000 mg | ORAL_TABLET | Freq: Every day | ORAL | 1 refills | Status: DC

## 2022-05-02 MED ORDER — METFORMIN HCL ER 750 MG PO TB24: 1500.0000 mg | ORAL_TABLET | Freq: Every day | ORAL | 1 refills | Status: DC

## 2022-05-02 MED ORDER — LOSARTAN POTASSIUM 100 MG PO TABS: 100.0000 mg | ORAL_TABLET | Freq: Every day | ORAL | 1 refills | Status: DC

## 2022-05-02 MED ORDER — PIOGLITAZONE HCL 15 MG PO TABS: 15.0000 mg | ORAL_TABLET | Freq: Every day | ORAL | 1 refills | Status: DC

## 2022-05-03 ENCOUNTER — Other Ambulatory Visit: Payer: Self-pay

## 2022-05-03 DIAGNOSIS — D638 Anemia in other chronic diseases classified elsewhere: Secondary | ICD-10-CM

## 2022-05-03 DIAGNOSIS — D649 Anemia, unspecified: Secondary | ICD-10-CM

## 2022-05-05 LAB — COMPLETE METABOLIC PANEL WITH GFR
AG Ratio: 1.8 (calc) (ref 1.0–2.5)
ALT: 25 U/L (ref 9–46)
AST: 26 U/L (ref 10–35)
Albumin: 4.2 g/dL (ref 3.6–5.1)
Alkaline phosphatase (APISO): 42 U/L (ref 35–144)
BUN: 17 mg/dL (ref 7–25)
CO2: 22 mmol/L (ref 20–32)
Calcium: 9.3 mg/dL (ref 8.6–10.3)
Chloride: 111 mmol/L — ABNORMAL HIGH (ref 98–110)
Creat: 1.26 mg/dL (ref 0.70–1.28)
Globulin: 2.4 g/dL (calc) (ref 1.9–3.7)
Glucose, Bld: 138 mg/dL — ABNORMAL HIGH (ref 65–99)
Potassium: 4.3 mmol/L (ref 3.5–5.3)
Sodium: 143 mmol/L (ref 135–146)
Total Bilirubin: 0.6 mg/dL (ref 0.2–1.2)
Total Protein: 6.6 g/dL (ref 6.1–8.1)
eGFR: 60 mL/min/{1.73_m2} (ref >=60)

## 2022-05-05 LAB — CBC WITH DIFFERENTIAL/PLATELET
Absolute Monocytes: 371 cells/uL (ref 200–950)
Basophils Absolute: 42 cells/uL (ref 0–200)
Basophils Relative: 0.8 %
Eosinophils Absolute: 392 cells/uL (ref 15–500)
Eosinophils Relative: 7.4 %
HCT: 37.6 % — ABNORMAL LOW (ref 38.5–50.0)
Hemoglobin: 12.9 g/dL — ABNORMAL LOW (ref 13.2–17.1)
Lymphs Abs: 1277 cells/uL (ref 850–3900)
MCH: 30.9 pg (ref 27.0–33.0)
MCHC: 34.3 g/dL (ref 32.0–36.0)
MCV: 90 fL (ref 80.0–100.0)
MPV: 10.1 fL (ref 7.5–12.5)
Monocytes Relative: 7 %
Neutro Abs: 3217 cells/uL (ref 1500–7800)
Neutrophils Relative %: 60.7 %
Platelets: 212 10*3/uL (ref 140–400)
RBC: 4.18 10*6/uL — ABNORMAL LOW (ref 4.20–5.80)
RDW: 12.6 % (ref 11.0–15.0)
Total Lymphocyte: 24.1 %
WBC: 5.3 10*3/uL (ref 3.8–10.8)

## 2022-05-05 LAB — VITAMIN B12: Vitamin B-12: 309 pg/mL (ref 200–1100)

## 2022-05-05 LAB — IRON,TIBC AND FERRITIN PANEL
%SAT: 16 % (calc) — ABNORMAL LOW (ref 20–48)
Ferritin: 69 ng/mL (ref 24–380)
Iron: 61 ug/dL (ref 50–180)
TIBC: 384 mcg/dL (calc) (ref 250–425)

## 2022-05-05 LAB — LIPID PANEL
Cholesterol: 138 mg/dL (ref <200)
HDL: 33 mg/dL — ABNORMAL LOW (ref >=40)
LDL Cholesterol (Calc): 77 mg/dL (calc)
Non-HDL Cholesterol (Calc): 105 mg/dL (calc) (ref <130)
Total CHOL/HDL Ratio: 4.2 (calc) (ref <5.0)
Triglycerides: 184 mg/dL — ABNORMAL HIGH (ref <150)

## 2022-05-05 LAB — TEST AUTHORIZATION

## 2022-06-17 ENCOUNTER — Ambulatory Visit (INDEPENDENT_AMBULATORY_CARE_PROVIDER_SITE_OTHER): Payer: PPO

## 2022-06-17 VITALS — BP 114/68 | Ht 69.5 in | Wt 236.4 lb

## 2022-06-17 DIAGNOSIS — Z0000 Encounter for general adult medical examination without abnormal findings: Secondary | ICD-10-CM

## 2022-06-17 NOTE — Patient Instructions (Signed)
[name] , Thank you for taking time to come for your Medicare Wellness Visit. I appreciate your ongoing commitment to your health goals. Please review the following plan we discussed and let me know if I can assist you in the future.   These are the goals we discussed:  Goals   None     This is a list of the screening recommended for you and due dates:  Health Maintenance  Topic Date Due   Zoster (Shingles) Vaccine (1 of 2) 07/30/2022*   Colon Cancer Screening  05/02/2023*   Eye exam for diabetics  09/10/2022   Flu Shot  09/29/2022   Hemoglobin A1C  11/02/2022   Yearly kidney health urinalysis for diabetes  12/21/2022   Yearly kidney function blood test for diabetes  05/02/2023   Complete foot exam   05/02/2023   Medicare Annual Wellness Visit  06/17/2023   Pneumonia Vaccine  Completed   Hepatitis C Screening: USPSTF Recommendation to screen - Ages 79-79 yo.  Completed   HPV Vaccine  Aged Out   DTaP/Tdap/Td vaccine  Discontinued   COVID-19 Vaccine  Discontinued  *Topic was postponed. The date shown is not the original due date.    Advanced directives: no  Conditions/risks identified: low falls risk  Next appointment: Follow up in one year for your annual wellness visit. 06/23/2023 @ 1pm in person  Preventive Care 65 Years and Older, Male  Preventive care refers to lifestyle choices and visits with your health care provider that can promote health and wellness. What does preventive care include? A yearly physical exam. This is also called an annual well check. Dental exams once or twice a year. Routine eye exams. Ask your health care provider how often you should have your eyes checked. Personal lifestyle choices, including: Daily care of your teeth and gums. Regular physical activity. Eating a healthy diet. Avoiding tobacco and drug use. Limiting alcohol use. Practicing safe sex. Taking low doses of aspirin every day. Taking vitamin and mineral supplements as  recommended by your health care provider. What happens during an annual well check? The services and screenings done by your health care provider during your annual well check will depend on your age, overall health, lifestyle risk factors, and family history of disease. Counseling  Your health care provider may ask you questions about your: Alcohol use. Tobacco use. Drug use. Emotional well-being. Home and relationship well-being. Sexual activity. Eating habits. History of falls. Memory and ability to understand (cognition). Work and work Astronomer. Screening  You may have the following tests or measurements: Height, weight, and BMI. Blood pressure. Lipid and cholesterol levels. These may be checked every 5 years, or more frequently if you are over 75 years old. Skin check. Lung cancer screening. You may have this screening every year starting at age 55 if you have a 30-pack-year history of smoking and currently smoke or have quit within the past 15 years. Fecal occult blood test (FOBT) of the stool. You may have this test every year starting at age 78. Flexible sigmoidoscopy or colonoscopy. You may have a sigmoidoscopy every 5 years or a colonoscopy every 10 years starting at age 15. Prostate cancer screening. Recommendations will vary depending on your family history and other risks. Hepatitis C blood test. Hepatitis B blood test. Sexually transmitted disease (STD) testing. Diabetes screening. This is done by checking your blood sugar (glucose) after you have not eaten for a while (fasting). You may have this done every 1-3 years. Abdominal  aortic aneurysm (AAA) screening. You may need this if you are a current or former smoker. Osteoporosis. You may be screened starting at age 61 if you are at high risk. Talk with your health care provider about your test results, treatment options, and if necessary, the need for more tests. Vaccines  Your health care provider may recommend  certain vaccines, such as: Influenza vaccine. This is recommended every year. Tetanus, diphtheria, and acellular pertussis (Tdap, Td) vaccine. You may need a Td booster every 10 years. Zoster vaccine. You may need this after age 35. Pneumococcal 13-valent conjugate (PCV13) vaccine. One dose is recommended after age 33. Pneumococcal polysaccharide (PPSV23) vaccine. One dose is recommended after age 14. Talk to your health care provider about which screenings and vaccines you need and how often you need them. This information is not intended to replace advice given to you by your health care provider. Make sure you discuss any questions you have with your health care provider. Document Released: 03/13/2015 Document Revised: 11/04/2015 Document Reviewed: 12/16/2014 Elsevier Interactive Patient Education  2017 [place] Prevention in the Home Falls can cause injuries. They can happen to people of all ages. There are many things you can do to make your home safe and to help prevent falls. What can I do on the outside of my home? Regularly fix the edges of walkways and driveways and fix any cracks. Remove anything that might make you trip as you walk through a door, such as a raised step or threshold. Trim any bushes or trees on the path to your home. Use bright outdoor lighting. Clear any walking paths of anything that might make someone trip, such as rocks or tools. Regularly check to see if handrails are loose or broken. Make sure that both sides of any steps have handrails. Any raised decks and porches should have guardrails on the edges. Have any leaves, snow, or ice cleared regularly. Use sand or salt on walking paths during winter. Clean up any spills in your garage right away. This includes oil or grease spills. What can I do in the bathroom? Use night lights. Install grab bars by the toilet and in the tub and shower. Do not use towel bars as grab bars. Use non-skid mats or  decals in the tub or shower. If you need to sit down in the shower, use a plastic, non-slip stool. Keep the floor dry. Clean up any water that spills on the floor as soon as it happens. Remove soap buildup in the tub or shower regularly. Attach bath mats securely with double-sided non-slip rug tape. Do not have throw rugs and other things on the floor that can make you trip. What can I do in the bedroom? Use night lights. Make sure that you have a light by your bed that is easy to reach. Do not use any sheets or blankets that are too big for your bed. They should not hang down onto the floor. Have a firm chair that has side arms. You can use this for support while you get dressed. Do not have throw rugs and other things on the floor that can make you trip. What can I do in the kitchen? Clean up any spills right away. Avoid walking on wet floors. Keep items that you use a lot in easy-to-reach places. If you need to reach something above you, use a strong step stool that has a grab bar. Keep electrical cords out of the way. Do not use  floor polish or wax that makes floors slippery. If you must use wax, use non-skid floor wax. Do not have throw rugs and other things on the floor that can make you trip. What can I do with my stairs? Do not leave any items on the stairs. Make sure that there are handrails on both sides of the stairs and use them. Fix handrails that are broken or loose. Make sure that handrails are as long as the stairways. Check any carpeting to make sure that it is firmly attached to the stairs. Fix any carpet that is loose or worn. Avoid having throw rugs at the top or bottom of the stairs. If you do have throw rugs, attach them to the floor with carpet tape. Make sure that you have a light switch at the top of the stairs and the bottom of the stairs. If you do not have them, ask someone to add them for you. What else can I do to help prevent falls? Wear shoes that: Do not  have high heels. Have rubber bottoms. Are comfortable and fit you well. Are closed at the toe. Do not wear sandals. If you use a stepladder: Make sure that it is fully opened. Do not climb a closed stepladder. Make sure that both sides of the stepladder are locked into place. Ask someone to hold it for you, if possible. Clearly mark and make sure that you can see: Any grab bars or handrails. First and last steps. Where the edge of each step is. Use tools that help you move around (mobility aids) if they are needed. These include: Canes. Walkers. Scooters. Crutches. Turn on the lights when you go into a dark area. Replace any light bulbs as soon as they burn out. Set up your furniture so you have a clear path. Avoid moving your furniture around. If any of your floors are uneven, fix them. If there are any pets around you, be aware of where they are. Review your medicines with your doctor. Some medicines can make you feel dizzy. This can increase your chance of falling. Ask your doctor what other things that you can do to help prevent falls. This information is not intended to replace advice given to you by your health care provider. Make sure you discuss any questions you have with your health care provider. Document Released: 12/11/2008 Document Revised: 07/23/2015 Document Reviewed: 03/21/2014 Elsevier Interactive Patient Education  2017 [employer].

## 2022-06-17 NOTE — Progress Notes (Signed)
Subjective:   Brent Carroll is a 77 y.o. male who presents for Medicare Annual/Subsequent preventive examination.  Review of Systems    Cardiac Risk Factors include: advanced age (>30men, >25 women);diabetes mellitus;hypertension;male gender;dyslipidemia;obesity (BMI >30kg/m2);sedentary lifestyle     Objective:    Today's Vitals   06/17/22 1258  Weight: 236 lb 6.4 oz (107.2 kg)  Height: 5' 9.5" (1.765 m)  PainSc: 2    Body mass index is 34.41 kg/m.     06/17/2022    1:12 PM 10/07/2016    9:33 AM 05/02/2016    8:58 AM 04/04/2016   10:24 AM 02/12/2016   10:26 AM 01/10/2016    3:59 PM 11/12/2015    9:12 AM  Advanced Directives  Does Patient Have a Medical Advance Directive? No No No No No No No  Would patient like information on creating a medical advance directive?   No - Patient declined   No - patient declined information No - patient declined information    Current Medications (verified) Outpatient Encounter Medications as of 06/17/2022  Medication Sig   acetaminophen (TYLENOL) 500 MG tablet Take 1 tablet (500 mg total) by mouth every 6 (six) hours as needed.   atorvastatin (LIPITOR) 40 MG tablet TAKE 1 TABLET(40 MG) BY MOUTH DAILY   Cholecalciferol (VITAMIN D) 50 MCG (2000 UT) CAPS Take 1 capsule (2,000 Units total) by mouth daily.   Cyanocobalamin (B-12) 1000 MCG SUBL Place 1 mcg under the tongue daily.   fenofibrate (TRICOR) 145 MG tablet Take 1 tablet (145 mg total) by mouth daily. In place of lovaza   gabapentin (NEURONTIN) 300 MG capsule TAKE 2 CAPSULES(600 MG) BY MOUTH AT BEDTIME   losartan (COZAAR) 100 MG tablet Take 1 tablet (100 mg total) by mouth daily. TAKE 1 TABLET(100 MG) BY MOUTH DAILY   metFORMIN (GLUCOPHAGE-XR) 750 MG 24 hr tablet Take 2 tablets (1,500 mg total) by mouth daily with breakfast.   pioglitazone (ACTOS) 15 MG tablet Take 1 tablet (15 mg total) by mouth daily.   QUEtiapine (SEROQUEL) 25 MG tablet Take 1 tablet (25 mg total) by mouth at bedtime.    No facility-administered encounter medications on file as of 06/17/2022.    Allergies (verified) Trazodone and nefazodone and Trazodone   History: Past Medical History:  Diagnosis Date   Depression    Diabetes mellitus without complication    Hyperlipidemia    Hypertension    Hypogonadism in male    Insomnia    Obesity    Obstructive sleep apnea    Currently not using a CPAP   Past Surgical History:  Procedure Laterality Date   HERNIA REPAIR     HERNIA REPAIR Bilateral    inguinal    TONSILLECTOMY     Family History  Problem Relation Age of Onset   Heart disease Father    COPD Father    Social History   Socioeconomic History   Marital status: Married    Spouse name: Clydie Braun   Number of children: 3   Years of education: Not on file   Highest education level: Some college, no degree  Occupational History   Not on file  Tobacco Use   Smoking status: Former    Types: Cigarettes    Quit date: 04/04/1986    Years since quitting: 36.2   Smokeless tobacco: Never  Vaping Use   Vaping Use: Never used  Substance and Sexual Activity   Alcohol use: No    Alcohol/week: 0.0 standard drinks  of alcohol   Drug use: No   Sexual activity: Not Currently    Partners: Female  Other Topics Concern   Not on file  Social History Narrative   Not on file   Social Determinants of Health   Financial Resource Strain: Low Risk  (06/17/2022)   Overall Financial Resource Strain (CARDIA)    Difficulty of Paying Living Expenses: Not hard at all  Food Insecurity: No Food Insecurity (06/17/2022)   Hunger Vital Sign    Worried About Running Out of Food in the Last Year: Never true    Ran Out of Food in the Last Year: Never true  Transportation Needs: No Transportation Needs (06/17/2022)   PRAPARE - Administrator, Civil Service (Medical): No    Lack of Transportation (Non-Medical): No  Physical Activity: Inactive (06/17/2022)   Exercise Vital Sign    Days of Exercise per  Week: 0 days    Minutes of Exercise per Session: 0 min  Stress: No Stress Concern Present (06/17/2022)   [employer] of Occupational Health - Occupational Stress Questionnaire    Feeling of Stress : Not at all  Social Connections: Moderately Integrated (06/17/2022)   Social Connection and Isolation Panel [NHANES]    Frequency of Communication with Friends and Family: More than three times a week    Frequency of Social Gatherings with Friends and Family: More than three times a week    Attends Religious Services: More than 4 times per year    Active Member of [employer] or Organizations: No    Attends Engineer, structural: Never    Marital Status: Married    Tobacco Counseling Counseling given: Not Answered   Clinical Intake:  Pre-visit preparation completed: Yes  Pain : No/denies pain Pain Score: 2      BMI - recorded: 34.41 Nutritional Status: BMI > 30  Obese Nutritional Risks: None Diabetes: Yes CBG done?: No Did pt. bring in CBG monitor from home?: No  How often do you need to have someone help you when you read instructions, pamphlets, or other written materials from your doctor or pharmacy?: 1 - Never  Diabetic?yes  Interpreter Needed?: No  Comments: lives with wife Information entered by :: B.Leyton Magoon,LPN   Activities of Daily Living    06/17/2022    1:15 PM 05/02/2022    1:17 PM  In your present state of health, do you have any difficulty performing the following activities:  Hearing? 0 0  Vision? 0 0  Difficulty concentrating or making decisions? 0 0  Walking or climbing stairs? 1 1  Dressing or bathing? 0 0  Doing errands, shopping? 0 0  Preparing Food and eating ? N   Using the Toilet? N   In the past six months, have you accidently leaked urine? N   Do you have problems with loss of bowel control? N   Managing your Medications? N   Managing your Finances? N   Housekeeping or managing your Housekeeping? N     Patient Care Team: Alba Cory, MD as PCP - General (Family Medicine)  Indicate any recent Medical Services you may have received from other than [facility] providers in the past year (date may be approximate).     Assessment:   This is a routine wellness examination for Brent Carroll.  Hearing/Vision screen Hearing Screening - Comments:: Adequate hearing Vision Screening - Comments:: Adequate vision w/glasses [facility]   Dietary issues and exercise activities discussed: Current Exercise Habits: The patient  does not participate in regular exercise at present, Exercise limited by: neurologic condition(s)   Goals Addressed   None    Depression Screen    06/17/2022    1:08 PM 05/02/2022    1:16 PM 12/20/2021    2:15 PM 08/18/2021    2:04 PM 04/21/2021    1:46 PM 12/18/2020   10:16 AM 08/18/2020   10:00 AM  PHQ 2/9 Scores  PHQ - 2 Score 0 0 0 0 0 0 0  PHQ- 9 Score 0 0 0  0 0     Fall Risk    06/17/2022    1:04 PM 05/02/2022    1:16 PM 12/20/2021    2:15 PM 08/18/2021    2:04 PM 04/21/2021    1:45 PM  Fall Risk   Falls in the past year? 0 0 0 0 0  Number falls in past yr: 0   0 0  Injury with Fall? 0   0 0  Risk for fall due to : No Fall Risks Impaired balance/gait No Fall Risks Impaired balance/gait No Fall Risks  Follow up Education provided;Falls prevention discussed Falls prevention discussed;Education provided;Falls evaluation completed Falls prevention discussed;Education provided;Falls evaluation completed Falls prevention discussed Falls prevention discussed    FALL RISK PREVENTION PERTAINING TO THE HOME:  Any stairs in or around the home? Yes  If so, are there any without handrails? Yes  Home free of loose throw rugs in walkways, pet beds, electrical cords, etc? Yes  Adequate lighting in your home to reduce risk of falls? Yes   ASSISTIVE DEVICES UTILIZED TO PREVENT FALLS:  Life alert? No  Use of a cane, walker or w/c? Yes cane Grab bars in the bathroom? Yes  Shower chair or bench in shower?  No  Elevated toilet seat or a handicapped toilet? Yes   TIMED UP AND GO:  Was the test performed? Yes .  Length of time to ambulate 10 feet: 12 sec.   Gait slow and steady with assistive device  Cognitive Function:        06/17/2022    1:16 PM  6CIT Screen  What Year? 0 points  What month? 0 points  What time? 0 points  Count back from 20 0 points  Months in reverse 0 points  Repeat phrase 0 points  Total Score 0 points    Immunizations Immunization History  Administered Date(s) Administered   Fluad Quad(high Dose 65+) 11/23/2018, 12/10/2019, 12/18/2020, 12/20/2021   Influenza, High Dose Seasonal PF 01/07/2015, 11/12/2015, 01/05/2017, 02/06/2018   Influenza, Seasonal, Injecte, Preservative Fre 01/13/2010, 10/27/2010   Influenza,inj,Quad PF,6+ Mos 11/14/2012, 12/10/2013   Pneumococcal Conjugate-13 06/06/2013   Pneumococcal Polysaccharide-23 08/22/2011, 01/05/2017   Tdap 08/22/2011   Zoster, Live 08/22/2011    TDAP status: Up to date  Flu Vaccine status: Up to date  Pneumococcal vaccine status: Up to date  Covid-19 vaccine status: Declined, Education has been provided regarding the importance of this vaccine but patient still declined. Advised may receive this vaccine at local pharmacy or Health Dept.or vaccine clinic. Aware to provide a copy of the vaccination record if obtained from local pharmacy or Health Dept. Verbalized acceptance and understanding.  Qualifies for Shingles Vaccine? Yes   Zostavax completed No   Shingrix Completed?: No.    Education has been provided regarding the importance of this vaccine. Patient has been advised to call insurance company to determine out of pocket expense if they have not yet received this vaccine. Advised may also receive  vaccine at local pharmacy or Health Dept. Verbalized acceptance and understanding.  Screening Tests Health Maintenance  Topic Date Due   Zoster Vaccines- Shingrix (1 of 2) 07/30/2022 (Originally  11/18/1997)   COLONOSCOPY (Pts 45-67yrs Insurance coverage will need to be confirmed)  05/02/2023 (Originally 09/25/2018)   OPHTHALMOLOGY EXAM  09/10/2022   INFLUENZA VACCINE  09/29/2022   HEMOGLOBIN A1C  11/02/2022   Diabetic kidney evaluation - Urine ACR  12/21/2022   Diabetic kidney evaluation - eGFR measurement  05/02/2023   FOOT EXAM  05/02/2023   Medicare Annual Wellness (AWV)  06/17/2023   Pneumonia Vaccine 28+ Years old  Completed   Hepatitis C Screening  Completed   HPV VACCINES  Aged Out   DTaP/Tdap/Td  Discontinued   COVID-19 Vaccine  Discontinued    Health Maintenance  There are no preventive care reminders to display for this patient.   Colorectal cancer screening: Type of screening: Colonoscopy. Completed yes. Repeat every 5 years  Lung Cancer Screening: (Low Dose CT Chest recommended if Age 78-80 years, 30 pack-year currently smoking OR have quit w/in 15years.) does not qualify.   Lung Cancer Screening Referral: no  Additional Screening:  Hepatitis C Screening: does not qualify; Completed yes  Vision Screening: Recommended annual ophthalmology exams for early detection of glaucoma and other disorders of the eye. Is the patient up to date with their annual eye exam?  Yes  Who is the provider or what is the name of the office in which the patient attends annual eye exams? [facility] If pt is not established with a provider, would they like to be referred to a provider to establish care? No .   Dental Screening: Recommended annual dental exams for proper oral hygiene  Community Resource Referral / Chronic Care Management: CRR required this visit?  No   CCM required this visit?  No      Plan:     I have personally reviewed and noted the following in the patient's chart:   Medical and social history Use of alcohol, tobacco or illicit drugs  Current medications and supplements including opioid prescriptions. Patient is not currently taking opioid  prescriptions. Functional ability and status Nutritional status Physical activity Advanced directives List of other physicians Hospitalizations, surgeries, and ER visits in previous 12 months Vitals Screenings to include cognitive, depression, and falls Referrals and appointments  In addition, I have reviewed and discussed with patient certain preventive protocols, quality metrics, and best practice recommendations. A written personalized care plan for preventive services as well as general preventive health recommendations were provided to patient.     Sue Lush, LPN   1/61/0960   Nurse Notes: The patient states he is doing well and has no concerns or questions at this time.

## 2022-06-26 ENCOUNTER — Other Ambulatory Visit: Payer: Self-pay | Admitting: Family Medicine

## 2022-06-26 DIAGNOSIS — G4700 Insomnia, unspecified: Secondary | ICD-10-CM

## 2022-07-26 ENCOUNTER — Other Ambulatory Visit: Payer: Self-pay | Admitting: Family Medicine

## 2022-07-26 DIAGNOSIS — E1142 Type 2 diabetes mellitus with diabetic polyneuropathy: Secondary | ICD-10-CM

## 2022-07-27 ENCOUNTER — Other Ambulatory Visit: Payer: Self-pay

## 2022-07-27 DIAGNOSIS — E1142 Type 2 diabetes mellitus with diabetic polyneuropathy: Secondary | ICD-10-CM

## 2022-07-27 MED ORDER — GABAPENTIN 300 MG PO CAPS: 600.0000 mg | ORAL_CAPSULE | Freq: Every day | ORAL | 0 refills | Status: DC

## 2022-09-05 NOTE — Progress Notes (Unsigned)
Name: Brent Carroll   MRN: [id number]    DOB: [birth year]   Date:09/06/2022       Progress Note  Subjective  Chief Complaint  Follow Up  HPI  DMII: A1C went up from 6.7 % to 6.9% ,6.4 % ,6.6 % , up to 7.9 % down to 6.9 % up to 7.4 % down to down to 6.6 % and toda is 7.8 % , we added Actos and A1C today is down to 6.4 %  He has been compliant with medication and drinking more water. He denies polyphagia polyuria or polydipsia. DM with CKI on ARB, he states neuropathic pain on legs has been stable on gabapentin, has dyslipidemia and is taking  Crestor and fenofibrate at this time    Hyperlipidemia: Continue Crestor , last LDL slightly above goal at 77 - previously in the 50's but triglycerides improved down from 406 to 184 with fenofibrate   OSA: he states Dr. Thana Ates ordered  his sleep study, he was unable to tolerate CPAP machine, he also states too expensive to go back for a repeat study. Unchanged    GERD: he has occasional heartburn symptoms and takes Tums prn    Insomnia: he is off Ambien CR, he is taking Seroquel  and gabapentin. He is going to bed around 11:30 pm.. Medication helps him fall and stay asleep . He will call to let me know if he wants to switch to mail order    Morbid Obesity: he has multiple co-morbidities and BMI above 35 , weight is now stable around 238 lbs He lost weight in 2023   B12 and Vitamin D deficiency: he states he has been taking supplements now , last level improved but still low - advised to increase B12 to daily instead of 5 days per week   Anemic of chronic disease: B12 improved, iron studies normal , we will continue to monitor   CKI stage III: explained he needs to avoid NSAID's, drink water and keep diabetes and bp under control Last levels also showed anemia of chronic disease  Continue Losartan, discussed SGL-2 agonists but does not want a medication that is not generic. Levels stable and he has good urine output and no pruritus   Gait  instability: he has dizziness, uses a cane when out of his house He has problems with his inner ear problem. He does not want to go back PT because of cost. Seen by ENT in 2014 - Dr. Willeen Cass had evaluation done including MRI brain. Unchanged   HTN: he is taking medication as prescribed, no chest pain or palpitation. BP today is towards low end of normal He has very mild lower extremity edema that has been stable. We will go down on losartan dose to 50 mg daily   Patient Active Problem List   Diagnosis Date Noted   Stage 3a chronic kidney disease (HCC) 08/18/2021   Hypertension associated with type 2 diabetes mellitus (HCC) 08/18/2021   Type 2 diabetes mellitus with stage 3a chronic kidney disease, without long-term current use of insulin (HCC) 08/18/2021   Diabetic peripheral neuropathy associated with type 2 diabetes mellitus (HCC) 08/18/2021   Anemia of chronic disease 08/18/2021   Diabetic neuropathy (HCC) 01/05/2017   B12 deficiency 07/08/2016   Dyslipidemia associated with type 2 diabetes mellitus (HCC) 11/12/2015   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) 11/12/2015   Vitamin D deficiency 08/14/2015   CN (constipation) 01/07/2015   Hyperlipidemia 01/07/2015   Narrowing of  intervertebral disc space 08/25/2014   Type 2 diabetes mellitus with other diabetic kidney complication (HCC) 08/25/2014   Failure of erection 08/25/2014   Acid reflux 08/25/2014   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   Obstructive apnea 08/25/2014   Benign essential HTN 03/16/2009   Insomnia, persistent 02/10/2009    Past Surgical History:  Procedure Laterality Date   HERNIA REPAIR     HERNIA REPAIR Bilateral    inguinal    TONSILLECTOMY      Family History  Problem Relation Age of Onset   Heart disease Father    COPD Father     Social History   Tobacco Use   Smoking status: Former    Types: Cigarettes    Quit date: 04/04/1986    Years since quitting: 36.4   Smokeless  tobacco: Never  Substance Use Topics   Alcohol use: No    Alcohol/week: 0.0 standard drinks of alcohol     Current Outpatient Medications:    acetaminophen (TYLENOL) 500 MG tablet, Take 1 tablet (500 mg total) by mouth every 6 (six) hours as needed., Disp: 60 tablet, Rfl: 0   atorvastatin (LIPITOR) 40 MG tablet, TAKE 1 TABLET(40 MG) BY MOUTH DAILY, Disp: 90 tablet, Rfl: 3   Cholecalciferol (VITAMIN D) 50 MCG (2000 UT) CAPS, Take 1 capsule (2,000 Units total) by mouth daily., Disp: 30 capsule, Rfl: 0   Cyanocobalamin (B-12) 1000 MCG SUBL, Place 1 mcg under the tongue daily., Disp: 90 tablet, Rfl: 1   fenofibrate (TRICOR) 145 MG tablet, Take 1 tablet (145 mg total) by mouth daily. In place of lovaza, Disp: 90 tablet, Rfl: 1   gabapentin (NEURONTIN) 300 MG capsule, Take 2 capsules (600 mg total) by mouth at bedtime., Disp: 180 capsule, Rfl: 0   losartan (COZAAR) 100 MG tablet, Take 1 tablet (100 mg total) by mouth daily. TAKE 1 TABLET(100 MG) BY MOUTH DAILY, Disp: 90 tablet, Rfl: 1   metFORMIN (GLUCOPHAGE) 1000 MG tablet, Take 1,000 mg by mouth daily., Disp: , Rfl:    metFORMIN (GLUCOPHAGE-XR) 750 MG 24 hr tablet, Take 2 tablets (1,500 mg total) by mouth daily with breakfast., Disp: 180 tablet, Rfl: 1   pioglitazone (ACTOS) 15 MG tablet, Take 1 tablet (15 mg total) by mouth daily., Disp: 90 tablet, Rfl: 1   QUEtiapine (SEROQUEL) 25 MG tablet, TAKE 1 TABLET(25 MG) BY MOUTH AT BEDTIME, Disp: 90 tablet, Rfl: 0  Allergies  Allergen Reactions   Trazodone And Nefazodone    Trazodone Anxiety    I personally reviewed active problem list, medication list, allergies, family history, social history, health maintenance with the patient/caregiver today.   ROS  Constitutional: Negative for fever or weight change.  Respiratory: Negative for cough and shortness of breath.   Cardiovascular: Negative for chest pain or palpitations.  Gastrointestinal: Negative for abdominal pain, no bowel changes.   Musculoskeletal: positive  for gait problem but no  joint swelling.  Skin: Negative for rash.  Neurological: Negative for dizziness or headache.  No other specific complaints in a complete review of systems (except as listed in HPI above).   Objective  Vitals:   09/06/22 1119  BP: 116/70  Pulse: 85  Resp: 16  Temp: 97.8 F (36.6 C)  TempSrc: Oral  SpO2: 95%  Weight: 238 lb 9.6 oz (108.2 kg)  Height: 5\' 9"  (1.753 m)    Body mass index is 35.24 kg/m.  Physical Exam  Constitutional: Patient appears well-developed and well-nourished. Obese  No distress.  HEENT: head atraumatic,  normocephalic, pupils equal and reactive to light,, neck supple Cardiovascular: Normal rate, regular rhythm and normal heart sounds.  No murmur heard. No BLE edema. Pulmonary/Chest: Effort normal and breath sounds normal. No respiratory distress. Abdominal: Soft.  There is no tenderness. Psychiatric: Patient has a normal mood and affect. behavior is normal. Judgment and thought content normal.    PHQ2/9:    09/06/2022   11:19 AM 06/17/2022    1:08 PM 05/02/2022    1:16 PM 12/20/2021    2:15 PM 08/18/2021    2:04 PM  Depression screen PHQ 2/9  Decreased Interest 0 0 0 0 0  Down, Depressed, Hopeless 0 0 0 0 0  PHQ - 2 Score 0 0 0 0 0  Altered sleeping 0 0 0 0   Tired, decreased energy 0 0 0 0   Change in appetite 0 0 0 0   Feeling bad or failure about yourself  0 0 0 0   Trouble concentrating 0 0 0 0   Moving slowly or fidgety/restless 0 0 0 0   Suicidal thoughts 0 0 0 0   PHQ-9 Score 0 0 0 0     phq 9 is negative   Fall Risk:    09/06/2022   11:19 AM 06/17/2022    1:04 PM 05/02/2022    1:16 PM 12/20/2021    2:15 PM 08/18/2021    2:04 PM  Fall Risk   Falls in the past year? 0 0 0 0 0  Number falls in past yr:  0   0  Injury with Fall?  0   0  Risk for fall due to : No Fall Risks No Fall Risks Impaired balance/gait No Fall Risks Impaired balance/gait  Follow up Falls prevention discussed  Education provided;Falls prevention discussed Falls prevention discussed;Education provided;Falls evaluation completed Falls prevention discussed;Education provided;Falls evaluation completed Falls prevention discussed     Assessment & Plan  1. Type 2 diabetes mellitus with stage 3a chronic kidney disease, without long-term current use of insulin (HCC)  - POCT HgB A1C - losartan (COZAAR) 50 MG tablet; Take 1 tablet (50 mg total) by mouth daily. TAKE 1 TABLET(100 MG) BY MOUTH DAILY  Dispense: 90 tablet; Refill: 0  2. Hypertension associated with type 2 diabetes mellitus (HCC)  - losartan (COZAAR) 50 MG tablet; Take 1 tablet (50 mg total) by mouth daily. TAKE 1 TABLET(100 MG) BY MOUTH DAILY  Dispense: 90 tablet; Refill: 0  3. Morbid obesity (HCC)  Discussed with the patient the risk posed by an increased BMI. Discussed importance of portion control, calorie counting and at least 150 minutes of physical activity weekly. Avoid sweet beverages and drink more water. Eat at least 6 servings of fruit and vegetables daily    4. Diabetic peripheral neuropathy associated with type 2 diabetes mellitus (HCC)  Stable   5. Gastroesophageal reflux disease with esophagitis without hemorrhage  Stable   6. Anemia of chronic disease  We will continue to monitor   7. Obstructive apnea  Not wearing CPAP   8. Insomnia, persistent  He is doing well on seroquel   9. Gait instability  Stable , uses a cane  10. Stage 3a chronic kidney disease (HCC)  - losartan (COZAAR) 50 MG tablet; Take 1 tablet (50 mg total) by mouth daily. TAKE 1 TABLET(100 MG) BY MOUTH DAILY  Dispense: 90 tablet; Refill: 0

## 2022-09-06 ENCOUNTER — Encounter: Payer: Self-pay | Admitting: Family Medicine

## 2022-09-06 ENCOUNTER — Ambulatory Visit (INDEPENDENT_AMBULATORY_CARE_PROVIDER_SITE_OTHER): Payer: PPO | Admitting: Family Medicine

## 2022-09-06 VITALS — BP 116/70 | HR 85 | Temp 97.8°F | Resp 16 | Ht 69.0 in | Wt 238.6 lb

## 2022-09-06 DIAGNOSIS — E1142 Type 2 diabetes mellitus with diabetic polyneuropathy: Secondary | ICD-10-CM

## 2022-09-06 DIAGNOSIS — Z7984 Long term (current) use of oral hypoglycemic drugs: Secondary | ICD-10-CM | POA: Diagnosis not present

## 2022-09-06 DIAGNOSIS — E1159 Type 2 diabetes mellitus with other circulatory complications: Secondary | ICD-10-CM

## 2022-09-06 DIAGNOSIS — K2100 Gastro-esophageal reflux disease with esophagitis, without bleeding: Secondary | ICD-10-CM

## 2022-09-06 DIAGNOSIS — R2681 Unsteadiness on feet: Secondary | ICD-10-CM

## 2022-09-06 DIAGNOSIS — E1122 Type 2 diabetes mellitus with diabetic chronic kidney disease: Secondary | ICD-10-CM | POA: Diagnosis not present

## 2022-09-06 DIAGNOSIS — N1831 Chronic kidney disease, stage 3a: Secondary | ICD-10-CM | POA: Diagnosis not present

## 2022-09-06 DIAGNOSIS — I152 Hypertension secondary to endocrine disorders: Secondary | ICD-10-CM | POA: Diagnosis not present

## 2022-09-06 DIAGNOSIS — G4700 Insomnia, unspecified: Secondary | ICD-10-CM | POA: Diagnosis not present

## 2022-09-06 DIAGNOSIS — D638 Anemia in other chronic diseases classified elsewhere: Secondary | ICD-10-CM

## 2022-09-06 DIAGNOSIS — G4733 Obstructive sleep apnea (adult) (pediatric): Secondary | ICD-10-CM

## 2022-09-06 DIAGNOSIS — E119 Type 2 diabetes mellitus without complications: Secondary | ICD-10-CM | POA: Diagnosis not present

## 2022-09-06 LAB — POCT GLYCOSYLATED HEMOGLOBIN (HGB A1C): Hemoglobin A1C: 6.4 % — AB (ref 4.0–5.6)

## 2022-09-06 MED ORDER — LOSARTAN POTASSIUM 50 MG PO TABS
50.0000 mg | ORAL_TABLET | Freq: Every day | ORAL | 0 refills | Status: DC
Start: 2022-09-06 — End: 2022-12-30

## 2022-09-06 NOTE — Patient Instructions (Signed)
Please decide if you want to use mail order, it should cost you zero co-pay for generics  Take half of losartan 100 mg since your bp has been low for the past two visits

## 2022-09-14 ENCOUNTER — Other Ambulatory Visit: Payer: Self-pay | Admitting: Family Medicine

## 2022-09-14 DIAGNOSIS — G4700 Insomnia, unspecified: Secondary | ICD-10-CM

## 2022-09-21 ENCOUNTER — Telehealth: Payer: Self-pay | Admitting: Family Medicine

## 2022-09-21 NOTE — Telephone Encounter (Signed)
Copied from CRM [id number]. Topic: General - Other >> Sep 21, 2022  3:31 PM Phill Myron wrote: Mr. Shahin ask that you update his pharmacy to:  [id number] [place], [place] - [address] [place] [address] Phone: [phone] Fax: [phone] Hours: Not open 24 hours  He has an upcoming refill on metformin [facility]   Thank you

## 2022-09-29 ENCOUNTER — Other Ambulatory Visit: Payer: Self-pay | Admitting: Family Medicine

## 2022-09-29 DIAGNOSIS — E1169 Type 2 diabetes mellitus with other specified complication: Secondary | ICD-10-CM

## 2022-09-29 NOTE — Telephone Encounter (Signed)
Medication Refill - Medication: metFORMIN (GLUCOPHAGE-XR) 750 MG 24 hr table   Has the patient contacted their pharmacy? No.  Preferred Pharmacy (with phone number or street name):  [id number] [place] [address] Phone: [phone]  Fax: [phone]     Has the patient been seen for an appointment in the last year OR does the patient have an upcoming appointment? Yes.    Agent: Please be advised that RX refills may take up to 3 business days. We ask that you follow-up with your pharmacy.

## 2022-09-30 NOTE — Telephone Encounter (Signed)
Request is too soon.  Requested Prescriptions  Pending Prescriptions Disp Refills   metFORMIN (GLUCOPHAGE-XR) 750 MG 24 hr tablet 180 tablet 1    Sig: Take 2 tablets (1,500 mg total) by mouth daily with breakfast.     Endocrinology:  Diabetes - Biguanides Passed - 09/29/2022  5:29 PM      Passed - Cr in normal range and within 360 days    Creat  Date Value Ref Range Status  05/02/2022 1.26 0.70 - 1.28 mg/dL Final   Creatinine, Urine  Date Value Ref Range Status  12/20/2021 124 20 - 320 mg/dL Final         Passed - HBA1C is between 0 and 7.9 and within 180 days    Hemoglobin A1C  Date Value Ref Range Status  09/06/2022 6.4 (A) 4.0 - 5.6 % Final   HbA1c, POC (prediabetic range)  Date Value Ref Range Status  02/06/2018 6.6 (A) 5.7 - 6.4 % Final   HbA1c, POC (controlled diabetic range)  Date Value Ref Range Status  02/06/2018 6.6 0.0 - 7.0 % Final   HbA1c POC (<> result, manual entry)  Date Value Ref Range Status  02/06/2018 6.6 4.0 - 5.6 % Final   Hgb A1c MFr Bld  Date Value Ref Range Status  04/21/2021 6.9 (H) <5.7 % of total Hgb Final    Comment:    For someone without known diabetes, a hemoglobin A1c value of 6.5% or greater indicates that they may have  diabetes and this should be confirmed with a follow-up  test. . For someone with known diabetes, a value <7% indicates  that their diabetes is well controlled and a value  greater than or equal to 7% indicates suboptimal  control. A1c targets should be individualized based on  duration of diabetes, age, comorbid conditions, and  other considerations. . Currently, no consensus exists regarding use of hemoglobin A1c for diagnosis of diabetes for children. .          Passed - eGFR in normal range and within 360 days    GFR, Est African American  Date Value Ref Range Status  08/18/2020 80 > OR = 60 mL/min/1.85m2 Final   GFR, Est Non African American  Date Value Ref Range Status  08/18/2020 69 > OR = 60  mL/min/1.62m2 Final   eGFR  Date Value Ref Range Status  05/02/2022 60 > OR = 60 mL/min/1.22m2 Final         Passed - B12 Level in normal range and within 720 days    Vitamin B-12  Date Value Ref Range Status  05/02/2022 309 200 - 1,100 pg/mL Final    Comment:    . Please Note: Although the reference range for vitamin B12 is [phone] pg/mL, it has been reported that between 5 and 10% of patients with values between 200 and 400 pg/mL may experience neuropsychiatric and hematologic abnormalities due to occult B12 deficiency; less than 1% of patients with values above 400 pg/mL will have symptoms. Verna Czech - Valid encounter within last 6 months    Recent Outpatient Visits           3 weeks ago Type 2 diabetes mellitus with stage 3a chronic kidney disease, without long-term current use of insulin [facility])   [place] [facility] [place], Danna Hefty, MD   5 months ago Type 2 diabetes mellitus with stage 3a chronic kidney disease, without long-term current use of  insulin [facility])   [place] [facility] [place]ba Cory, MD   9 months ago Type 2 diabetes mellitus with stage 3a chronic kidney disease, without long-term current use of insulin (HCC)   [place] [facility] Alba Cory, MD   1 year ago Type 2 diabetes mellitus with stage 3a chronic kidney disease, without long-term current use of insulin (HCC)   [place] [facility] Alba Cory, MD   1 year ago Type 2 diabetes mellitus with stage 3a chronic kidney disease, without long-term current use of insulin (HCC)   [place] [facility] [place], Danna Hefty, MD              Passed - CBC within normal limits and completed in the last 12 months    WBC  Date Value Ref Range Status  05/02/2022 5.3 3.8 - 10.8 Thousand/uL Final   RBC  Date Value Ref Range Status  05/02/2022 4.18 (L) 4.20 - 5.80 Million/uL Final   Hemoglobin  Date Value  Ref Range Status  05/02/2022 12.9 (L) 13.2 - 17.1 g/dL Final  81/19/1478 29.5 13.0 - 17.7 g/dL Final   HCT  Date Value Ref Range Status  05/02/2022 37.6 (L) 38.5 - 50.0 % Final   Hematocrit  Date Value Ref Range Status  09/18/2018 38.6 37.5 - 51.0 % Final   MCHC  Date Value Ref Range Status  05/02/2022 34.3 32.0 - 36.0 g/dL Final   [facility]  Date Value Ref Range Status  05/02/2022 30.9 27.0 - 33.0 pg Final   MCV  Date Value Ref Range Status  05/02/2022 90.0 80.0 - 100.0 fL Final  09/18/2018 92 79 - 97 fL Final   No results found for: "PLTCOUNTKUC", "LABPLAT", "POCPLA" RDW  Date Value Ref Range Status  05/02/2022 12.6 11.0 - 15.0 % Final  09/18/2018 13.1 11.6 - 15.4 % Final

## 2022-10-27 ENCOUNTER — Other Ambulatory Visit: Payer: Self-pay | Admitting: Family Medicine

## 2022-10-27 DIAGNOSIS — E1142 Type 2 diabetes mellitus with diabetic polyneuropathy: Secondary | ICD-10-CM

## 2022-10-27 NOTE — Telephone Encounter (Signed)
Medication Refill - Medication: gabapentin (NEURONTIN) 300 MG capsule   Has the patient contacted their pharmacy? Yes.     Preferred Pharmacy (with phone number or street name):  [id number] [place] [address] Phone: [phone]  Fax: [phone]      Has the patient been seen for an appointment in the last year OR does the patient have an upcoming appointment? Yes.    The patient only has a few pills left. Please assist patient further.

## 2022-10-28 MED ORDER — GABAPENTIN 300 MG PO CAPS
600.0000 mg | ORAL_CAPSULE | Freq: Every day | ORAL | 0 refills | Status: DC
Start: 2022-10-28 — End: 2022-12-30

## 2022-10-28 NOTE — Telephone Encounter (Signed)
Requested Prescriptions  Pending Prescriptions Disp Refills   gabapentin (NEURONTIN) 300 MG capsule 180 capsule 0    Sig: Take 2 capsules (600 mg total) by mouth at bedtime.     Neurology: Anticonvulsants - gabapentin Passed - 10/27/2022 11:10 AM      Passed - Cr in normal range and within 360 days    Creat  Date Value Ref Range Status  05/02/2022 1.26 0.70 - 1.28 mg/dL Final   Creatinine, Urine  Date Value Ref Range Status  12/20/2021 124 20 - 320 mg/dL Final         Passed - Completed PHQ-2 or PHQ-9 in the last 360 days      Passed - Valid encounter within last 12 months    Recent Outpatient Visits           1 month ago Type 2 diabetes mellitus with stage 3a chronic kidney disease, without long-term current use of insulin (HCC)   [place] [facility] [place], Danna Hefty, MD   5 months ago Type 2 diabetes mellitus with stage 3a chronic kidney disease, without long-term current use of insulin [facility])   [place] [facility] [place], Danna Hefty, MD   10 months ago Type 2 diabetes mellitus with stage 3a chronic kidney disease, without long-term current use of insulin [facility])   [place] [facility] [place], Danna Hefty, MD   1 year ago Type 2 diabetes mellitus with stage 3a chronic kidney disease, without long-term current use of insulin [facility])   [place] [facility] [place], Danna Hefty, MD   1 year ago Type 2 diabetes mellitus with stage 3a chronic kidney disease, without long-term current use of insulin [facility])    [facility] Alba Cory, MD       Future Appointments             In 2 months Carlynn Purl, Danna Hefty, MD [facility]

## 2022-11-03 ENCOUNTER — Other Ambulatory Visit: Payer: Self-pay | Admitting: Family Medicine

## 2022-11-03 DIAGNOSIS — E1169 Type 2 diabetes mellitus with other specified complication: Secondary | ICD-10-CM

## 2022-12-09 ENCOUNTER — Other Ambulatory Visit: Payer: Self-pay | Admitting: Family Medicine

## 2022-12-09 DIAGNOSIS — G4700 Insomnia, unspecified: Secondary | ICD-10-CM

## 2022-12-09 MED ORDER — QUETIAPINE FUMARATE 25 MG PO TABS: 25.0000 mg | ORAL_TABLET | Freq: Every day | ORAL | 0 refills | Status: DC

## 2022-12-09 NOTE — Telephone Encounter (Signed)
Requested medication (s) are due for refill today -yes  Requested medication (s) are on the active medication list -yes  Future visit scheduled -yes  Last refill: 09/15/22 #90  Notes to clinic: non delegated Rx  Requested Prescriptions  Pending Prescriptions Disp Refills   QUEtiapine (SEROQUEL) 25 MG tablet 90 tablet 0     Not Delegated - Psychiatry:  Antipsychotics - Second Generation (Atypical) - quetiapine Failed - 12/09/2022  1:46 PM      Failed - This refill cannot be delegated      Failed - TSH in normal range and within 360 days    TSH  Date Value Ref Range Status  08/18/2020 1.93 0.40 - 4.50 mIU/L Final         Failed - Lipid Panel in normal range within the last 12 months    Cholesterol, Total  Date Value Ref Range Status  09/18/2018 142 100 - 199 mg/dL Final   Cholesterol  Date Value Ref Range Status  05/02/2022 138 <200 mg/dL Final   LDL Cholesterol (Calc)  Date Value Ref Range Status  05/02/2022 77 mg/dL (calc) Final    Comment:    Reference range: <100 . Desirable range <100 mg/dL for primary prevention;   <70 mg/dL for patients with CHD or diabetic patients  with > or = 2 CHD risk factors. Marland Kitchen LDL-C is now calculated using the Martin-Hopkins  calculation, which is a validated novel method providing  better accuracy than the Friedewald equation in the  estimation of LDL-C.  Horald Pollen et al. Lenox Ahr. 1610;960(45): 2061-2068  (http://education.[facility].com/faq/FAQ164)    HDL  Date Value Ref Range Status  05/02/2022 33 (L) > OR = 40 mg/dL Final  40/98/1191 29 (L) >39 mg/dL Final   Triglycerides  Date Value Ref Range Status  05/02/2022 184 (H) <150 mg/dL Final         Failed - CMP within normal limits and completed in the last 12 months    Albumin  Date Value Ref Range Status  09/18/2018 4.3 3.8 - 4.8 g/dL Final   Alkaline Phosphatase  Date Value Ref Range Status  09/18/2018 81 39 - 117 IU/L Final   Alkaline phosphatase (APISO)  Date  Value Ref Range Status  05/02/2022 42 35 - 144 U/L Final   ALT  Date Value Ref Range Status  05/02/2022 25 9 - 46 U/L Final   AST  Date Value Ref Range Status  05/02/2022 26 10 - 35 U/L Final   BUN  Date Value Ref Range Status  05/02/2022 17 7 - 25 mg/dL Final  47/82/9562 13 8 - 27 mg/dL Final   Calcium  Date Value Ref Range Status  05/02/2022 9.3 8.6 - 10.3 mg/dL Final   CO2  Date Value Ref Range Status  05/02/2022 22 20 - 32 mmol/L Final   Creat  Date Value Ref Range Status  05/02/2022 1.26 0.70 - 1.28 mg/dL Final   Creatinine, Urine  Date Value Ref Range Status  12/20/2021 124 20 - 320 mg/dL Final   Glucose, Bld  Date Value Ref Range Status  05/02/2022 138 (H) 65 - 99 mg/dL Final    Comment:    .            Fasting reference interval . For someone without known diabetes, a glucose value >125 mg/dL indicates that they may have diabetes and this should be confirmed with a follow-up test. .    POC Glucose  Date Value Ref Range Status  08/13/2015  254 (A) 70 - 99 mg/dl Final   Potassium  Date Value Ref Range Status  05/02/2022 4.3 3.5 - 5.3 mmol/L Final   Sodium  Date Value Ref Range Status  05/02/2022 143 135 - 146 mmol/L Final  09/18/2018 141 134 - 144 mmol/L Final   Total Bilirubin  Date Value Ref Range Status  05/02/2022 0.6 0.2 - 1.2 mg/dL Final   Bilirubin Total  Date Value Ref Range Status  09/18/2018 1.0 0.0 - 1.2 mg/dL Final   Total Protein  Date Value Ref Range Status  05/02/2022 6.6 6.1 - 8.1 g/dL Final  16/11/9602 6.8 6.0 - 8.5 g/dL Final   GFR, Est African American  Date Value Ref Range Status  08/18/2020 80 > OR = 60 mL/min/1.51m2 Final   eGFR  Date Value Ref Range Status  05/02/2022 60 > OR = 60 mL/min/1.58m2 Final   GFR, Est Non African American  Date Value Ref Range Status  08/18/2020 69 > OR = 60 mL/min/1.70m2 Final         Passed - Last BP in normal range    BP Readings from Last 1 Encounters:  09/06/22 116/70          Passed - Last Heart Rate in normal range    Pulse Readings from Last 1 Encounters:  09/06/22 85         Passed - Valid encounter within last 6 months    Recent Outpatient Visits           3 months ago Type 2 diabetes mellitus with stage 3a chronic kidney disease, without long-term current use of insulin (HCC)   [place] [facility] [place], Danna Hefty, MD   7 months ago Type 2 diabetes mellitus with stage 3a chronic kidney disease, without long-term current use of insulin (HCC)   [place] [facility] Alba Cory, MD   11 months ago Type 2 diabetes mellitus with stage 3a chronic kidney disease, without long-term current use of insulin (HCC)   [place] [facility] [place], Danna Hefty, MD   1 year ago Type 2 diabetes mellitus with stage 3a chronic kidney disease, without long-term current use of insulin (HCC)   [place] [facility] Alba Cory, MD   1 year ago Type 2 diabetes mellitus with stage 3a chronic kidney disease, without long-term current use of insulin (HCC)   [place] [facility] Alba Cory, MD       Future Appointments             In 3 weeks Alba Cory, MD [facility]            Passed - CBC within normal limits and completed in the last 12 months    WBC  Date Value Ref Range Status  05/02/2022 5.3 3.8 - 10.8 Thousand/uL Final   RBC  Date Value Ref Range Status  05/02/2022 4.18 (L) 4.20 - 5.80 Million/uL Final   Hemoglobin  Date Value Ref Range Status  05/02/2022 12.9 (L) 13.2 - 17.1 g/dL Final  54/10/8117 14.7 13.0 - 17.7 g/dL Final   HCT  Date Value Ref Range Status  05/02/2022 37.6 (L) 38.5 - 50.0 % Final   Hematocrit  Date Value Ref Range Status  09/18/2018 38.6 37.5 - 51.0 % Final   MCHC  Date Value Ref Range Status  05/02/2022 34.3 32.0 - 36.0 g/dL Final   [facility]  Date Value Ref Range Status  05/02/2022 30.9  27.0 - 33.0 pg Final   MCV  Date Value Ref Range Status  05/02/2022 90.0 80.0 - 100.0 fL Final  09/18/2018 92 79 - 97 fL Final   No results found for: "PLTCOUNTKUC", "LABPLAT", "POCPLA" RDW  Date Value Ref Range Status  05/02/2022 12.6 11.0 - 15.0 % Final  09/18/2018 13.1 11.6 - 15.4 % Final            Requested Prescriptions  Pending Prescriptions Disp Refills   QUEtiapine (SEROQUEL) 25 MG tablet 90 tablet 0     Not Delegated - Psychiatry:  Antipsychotics - Second Generation (Atypical) - quetiapine Failed - 12/09/2022  1:46 PM      Failed - This refill cannot be delegated      Failed - TSH in normal range and within 360 days    TSH  Date Value Ref Range Status  08/18/2020 1.93 0.40 - 4.50 mIU/L Final         Failed - Lipid Panel in normal range within the last 12 months    Cholesterol, Total  Date Value Ref Range Status  09/18/2018 142 100 - 199 mg/dL Final   Cholesterol  Date Value Ref Range Status  05/02/2022 138 <200 mg/dL Final   LDL Cholesterol (Calc)  Date Value Ref Range Status  05/02/2022 77 mg/dL (calc) Final    Comment:    Reference range: <100 . Desirable range <100 mg/dL for primary prevention;   <70 mg/dL for patients with CHD or diabetic patients  with > or = 2 CHD risk factors. Marland Kitchen LDL-C is now calculated using the Martin-Hopkins  calculation, which is a validated novel method providing  better accuracy than the Friedewald equation in the  estimation of LDL-C.  Horald Pollen et al. Lenox Ahr. 0981;191(47): 2061-2068  (http://education.[facility].com/faq/FAQ164)    HDL  Date Value Ref Range Status  05/02/2022 33 (L) > OR = 40 mg/dL Final  82/95/6213 29 (L) >39 mg/dL Final   Triglycerides  Date Value Ref Range Status  05/02/2022 184 (H) <150 mg/dL Final         Failed - CMP within normal limits and completed in the last 12 months    Albumin  Date Value Ref Range Status  09/18/2018 4.3 3.8 - 4.8 g/dL Final   Alkaline Phosphatase   Date Value Ref Range Status  09/18/2018 81 39 - 117 IU/L Final   Alkaline phosphatase (APISO)  Date Value Ref Range Status  05/02/2022 42 35 - 144 U/L Final   ALT  Date Value Ref Range Status  05/02/2022 25 9 - 46 U/L Final   AST  Date Value Ref Range Status  05/02/2022 26 10 - 35 U/L Final   BUN  Date Value Ref Range Status  05/02/2022 17 7 - 25 mg/dL Final  08/65/7846 13 8 - 27 mg/dL Final   Calcium  Date Value Ref Range Status  05/02/2022 9.3 8.6 - 10.3 mg/dL Final   CO2  Date Value Ref Range Status  05/02/2022 22 20 - 32 mmol/L Final   Creat  Date Value Ref Range Status  05/02/2022 1.26 0.70 - 1.28 mg/dL Final   Creatinine, Urine  Date Value Ref Range Status  12/20/2021 124 20 - 320 mg/dL Final   Glucose, Bld  Date Value Ref Range Status  05/02/2022 138 (H) 65 - 99 mg/dL Final    Comment:    .            Fasting reference interval . For someone without known  diabetes, a glucose value >125 mg/dL indicates that they may have diabetes and this should be confirmed with a follow-up test. .    POC Glucose  Date Value Ref Range Status  08/13/2015 254 (A) 70 - 99 mg/dl Final   Potassium  Date Value Ref Range Status  05/02/2022 4.3 3.5 - 5.3 mmol/L Final   Sodium  Date Value Ref Range Status  05/02/2022 143 135 - 146 mmol/L Final  09/18/2018 141 134 - 144 mmol/L Final   Total Bilirubin  Date Value Ref Range Status  05/02/2022 0.6 0.2 - 1.2 mg/dL Final   Bilirubin Total  Date Value Ref Range Status  09/18/2018 1.0 0.0 - 1.2 mg/dL Final   Total Protein  Date Value Ref Range Status  05/02/2022 6.6 6.1 - 8.1 g/dL Final  16/11/9602 6.8 6.0 - 8.5 g/dL Final   GFR, Est African American  Date Value Ref Range Status  08/18/2020 80 > OR = 60 mL/min/1.41m2 Final   eGFR  Date Value Ref Range Status  05/02/2022 60 > OR = 60 mL/min/1.75m2 Final   GFR, Est Non African American  Date Value Ref Range Status  08/18/2020 69 > OR = 60 mL/min/1.61m2 Final          Passed - Last BP in normal range    BP Readings from Last 1 Encounters:  09/06/22 116/70         Passed - Last Heart Rate in normal range    Pulse Readings from Last 1 Encounters:  09/06/22 85         Passed - Valid encounter within last 6 months    Recent Outpatient Visits           3 months ago Type 2 diabetes mellitus with stage 3a chronic kidney disease, without long-term current use of insulin (HCC)   [place] [facility] [place], Danna Hefty, MD   7 months ago Type 2 diabetes mellitus with stage 3a chronic kidney disease, without long-term current use of insulin (HCC)   [place] [facility] Alba Cory, MD   11 months ago Type 2 diabetes mellitus with stage 3a chronic kidney disease, without long-term current use of insulin (HCC)   [place] [facility] [place], Danna Hefty, MD   1 year ago Type 2 diabetes mellitus with stage 3a chronic kidney disease, without long-term current use of insulin (HCC)   [place] [facility] [place], Danna Hefty, MD   1 year ago Type 2 diabetes mellitus with stage 3a chronic kidney disease, without long-term current use of insulin (HCC)   [place] [facility] Alba Cory, MD       Future Appointments             In 3 weeks Alba Cory, MD [facility]            Passed - CBC within normal limits and completed in the last 12 months    WBC  Date Value Ref Range Status  05/02/2022 5.3 3.8 - 10.8 Thousand/uL Final   RBC  Date Value Ref Range Status  05/02/2022 4.18 (L) 4.20 - 5.80 Million/uL Final   Hemoglobin  Date Value Ref Range Status  05/02/2022 12.9 (L) 13.2 - 17.1 g/dL Final  54/10/8117 14.7 13.0 - 17.7 g/dL Final   HCT  Date Value Ref Range Status  05/02/2022 37.6 (L) 38.5 - 50.0 % Final   Hematocrit  Date Value Ref Range Status  09/18/2018  38.6 37.5 - 51.0 % Final   MCHC  Date Value Ref  Range Status  05/02/2022 34.3 32.0 - 36.0 g/dL Final   [facility]  Date Value Ref Range Status  05/02/2022 30.9 27.0 - 33.0 pg Final   MCV  Date Value Ref Range Status  05/02/2022 90.0 80.0 - 100.0 fL Final  09/18/2018 92 79 - 97 fL Final   No results found for: "PLTCOUNTKUC", "LABPLAT", "POCPLA" RDW  Date Value Ref Range Status  05/02/2022 12.6 11.0 - 15.0 % Final  09/18/2018 13.1 11.6 - 15.4 % Final

## 2022-12-09 NOTE — Telephone Encounter (Signed)
Medication Refill - Medication: Quetiapine 25 mg at night  Has the patient contacted their pharmacy? Yes.   (Agent: If no, request that the patient contact the pharmacy for the refill. If patient does not wish to contact the pharmacy document the reason why and proceed with request.) (Agent: If yes, when and what did the pharmacy advise?)  Preferred Pharmacy (with phone number or street name): [facility] Has the patient been seen for an appointment in the last year OR does the patient have an upcoming appointment? Yes.    Agent: Please be advised that RX refills may take up to 3 business days. We ask that you follow-up with your pharmacy.

## 2022-12-22 ENCOUNTER — Other Ambulatory Visit: Payer: Self-pay | Admitting: Family Medicine

## 2022-12-22 DIAGNOSIS — G4700 Insomnia, unspecified: Secondary | ICD-10-CM

## 2022-12-30 ENCOUNTER — Ambulatory Visit (INDEPENDENT_AMBULATORY_CARE_PROVIDER_SITE_OTHER): Payer: PPO | Admitting: Family Medicine

## 2022-12-30 ENCOUNTER — Encounter: Payer: Self-pay | Admitting: Family Medicine

## 2022-12-30 VITALS — BP 122/70 | HR 96 | Temp 98.0°F | Resp 16 | Ht 69.0 in | Wt 241.9 lb

## 2022-12-30 DIAGNOSIS — N1831 Chronic kidney disease, stage 3a: Secondary | ICD-10-CM | POA: Diagnosis not present

## 2022-12-30 DIAGNOSIS — E785 Hyperlipidemia, unspecified: Secondary | ICD-10-CM

## 2022-12-30 DIAGNOSIS — G4733 Obstructive sleep apnea (adult) (pediatric): Secondary | ICD-10-CM | POA: Diagnosis not present

## 2022-12-30 DIAGNOSIS — G4700 Insomnia, unspecified: Secondary | ICD-10-CM

## 2022-12-30 DIAGNOSIS — I152 Hypertension secondary to endocrine disorders: Secondary | ICD-10-CM | POA: Diagnosis not present

## 2022-12-30 DIAGNOSIS — E1142 Type 2 diabetes mellitus with diabetic polyneuropathy: Secondary | ICD-10-CM | POA: Diagnosis not present

## 2022-12-30 DIAGNOSIS — Z23 Encounter for immunization: Secondary | ICD-10-CM

## 2022-12-30 DIAGNOSIS — R0600 Dyspnea, unspecified: Secondary | ICD-10-CM

## 2022-12-30 DIAGNOSIS — E1122 Type 2 diabetes mellitus with diabetic chronic kidney disease: Secondary | ICD-10-CM

## 2022-12-30 DIAGNOSIS — E1169 Type 2 diabetes mellitus with other specified complication: Secondary | ICD-10-CM

## 2022-12-30 DIAGNOSIS — E1159 Type 2 diabetes mellitus with other circulatory complications: Secondary | ICD-10-CM | POA: Diagnosis not present

## 2022-12-30 LAB — POCT GLYCOSYLATED HEMOGLOBIN (HGB A1C): Hemoglobin A1C: 6.2 % — AB (ref 4.0–5.6)

## 2022-12-30 MED ORDER — QUETIAPINE FUMARATE 25 MG PO TABS
25.0000 mg | ORAL_TABLET | Freq: Every day | ORAL | 0 refills | Status: DC
Start: 2022-12-30 — End: 2023-05-03

## 2022-12-30 MED ORDER — ATORVASTATIN CALCIUM 40 MG PO TABS: 40.0000 mg | ORAL_TABLET | Freq: Every day | ORAL | 1 refills | Status: AC

## 2022-12-30 MED ORDER — LOSARTAN POTASSIUM 50 MG PO TABS
50.0000 mg | ORAL_TABLET | Freq: Every day | ORAL | 1 refills | Status: AC
Start: 2022-12-30 — End: ?

## 2022-12-30 MED ORDER — FENOFIBRATE 145 MG PO TABS: 145.0000 mg | ORAL_TABLET | Freq: Every day | ORAL | 1 refills | Status: AC

## 2022-12-30 MED ORDER — GABAPENTIN 300 MG PO CAPS
600.0000 mg | ORAL_CAPSULE | Freq: Every day | ORAL | 1 refills | Status: AC
Start: 2022-12-30 — End: ?

## 2022-12-30 MED ORDER — PIOGLITAZONE HCL 15 MG PO TABS: 15.0000 mg | ORAL_TABLET | Freq: Every day | ORAL | 1 refills | Status: AC

## 2022-12-30 MED ORDER — METFORMIN HCL ER 750 MG PO TB24: 1500.0000 mg | ORAL_TABLET | Freq: Every day | ORAL | 1 refills | Status: AC

## 2023-01-09 ENCOUNTER — Ambulatory Visit: Payer: PPO | Admitting: Family Medicine

## 2023-03-09 DIAGNOSIS — H353132 Nonexudative age-related macular degeneration, bilateral, intermediate dry stage: Secondary | ICD-10-CM | POA: Diagnosis not present

## 2023-05-03 ENCOUNTER — Ambulatory Visit (INDEPENDENT_AMBULATORY_CARE_PROVIDER_SITE_OTHER): Payer: PPO | Admitting: Family Medicine

## 2023-05-03 ENCOUNTER — Other Ambulatory Visit: Payer: Self-pay | Admitting: Family Medicine

## 2023-05-03 ENCOUNTER — Encounter: Payer: Self-pay | Admitting: Family Medicine

## 2023-05-03 VITALS — BP 128/76 | HR 100 | Resp 16 | Ht 69.0 in | Wt 233.7 lb

## 2023-05-03 DIAGNOSIS — E538 Deficiency of other specified B group vitamins: Secondary | ICD-10-CM

## 2023-05-03 DIAGNOSIS — E785 Hyperlipidemia, unspecified: Secondary | ICD-10-CM

## 2023-05-03 DIAGNOSIS — B351 Tinea unguium: Secondary | ICD-10-CM

## 2023-05-03 DIAGNOSIS — E1169 Type 2 diabetes mellitus with other specified complication: Secondary | ICD-10-CM | POA: Diagnosis not present

## 2023-05-03 DIAGNOSIS — G4700 Insomnia, unspecified: Secondary | ICD-10-CM | POA: Diagnosis not present

## 2023-05-03 DIAGNOSIS — D649 Anemia, unspecified: Secondary | ICD-10-CM | POA: Diagnosis not present

## 2023-05-03 DIAGNOSIS — N1831 Chronic kidney disease, stage 3a: Secondary | ICD-10-CM | POA: Diagnosis not present

## 2023-05-03 DIAGNOSIS — E1122 Type 2 diabetes mellitus with diabetic chronic kidney disease: Secondary | ICD-10-CM | POA: Diagnosis not present

## 2023-05-03 DIAGNOSIS — E559 Vitamin D deficiency, unspecified: Secondary | ICD-10-CM | POA: Diagnosis not present

## 2023-05-03 DIAGNOSIS — K2100 Gastro-esophageal reflux disease with esophagitis, without bleeding: Secondary | ICD-10-CM

## 2023-05-03 DIAGNOSIS — E66811 Obesity, class 1: Secondary | ICD-10-CM

## 2023-05-03 LAB — POCT GLYCOSYLATED HEMOGLOBIN (HGB A1C): Hemoglobin A1C: 6.4 % — AB (ref 4.0–5.6)

## 2023-05-03 MED ORDER — TERBINAFINE HCL 250 MG PO TABS: 250.0000 mg | ORAL_TABLET | Freq: Every day | ORAL | 0 refills | Status: DC

## 2023-05-03 MED ORDER — QUETIAPINE FUMARATE 25 MG PO TABS: 25.0000 mg | ORAL_TABLET | Freq: Every day | ORAL | 0 refills | Status: DC

## 2023-05-03 NOTE — Progress Notes (Signed)
 Name: Brent Carroll   MRN: [id number]    DOB: 1945-10-11   Date:05/03/2023       Progress Note  Subjective  Chief Complaint  Chief Complaint  Patient presents with   Medical Management of Chronic Issues   HPI   DMII: A1C went up from 6.7 % to 6.9% ,6.4 % ,6.6 % , up to 7.9 % down to 6.9 % up to 7.4 % ,6.6 % , 7.8 % we added Actos and it went  down to 6.4 % and today is 6.2 %   He has been compliant with medication and drinking more water. He denies polyphagia polyuria or polydipsia. DM with CKI on ARB, he states neuropathic pain on legs has been stable on gabapentin, has dyslipidemia and is taking  Crestor and fenofibrate at this time . Eye exam is up to date    Hyperlipidemia: Continue Crestor and Tricor, no side effects, insurance does not cover fish oil. We will recheck labs today.    OSA: he states Dr. Thana Ates ordered  his sleep study, he was unable to tolerate CPAP machine, he also states too expensive to go back for a repeat study.  He is waking up occasionally gasping for air and has to seat up on the side of the bed intermittently but states doing better since last visit. Marland Kitchen Discussed getting Echo but he wants to hold off for now ( paroxysmal nocturnal dyspnea) . We will check EKG next visit    GERD: he has occasional heartburn symptoms and takes Tums prn .  Symptoms stable    Insomnia: he is off Ambien CR, he is taking Seroquel  and gabapentin. He is going to bed around 11:30 pm.. Medication helps him fall and stay asleep. He needs a refill today    Obesity : his weight is trending down, he states he is trying to drink more water, he lost 9 lbs since last visit   B12 and Vitamin D deficiency: he states he has been taking supplements now , recheck labs today    Anemic of chronic disease: B12 improved, iron studies normal , but we need to recheck since drop of ferritin over time and not up to date with colonoscopy ( not interested )   CKI stage III:a explained he needs to avoid  NSAID's, drink water and keep diabetes and bp under control Last levels also showed anemia of chronic disease  Continue Losartan, discussed SGL-2 agonists but too costly. We will recheck urine micro and labs, GFR goes up and down usually 50's but has gone down to 47   Gait instability: he has dizziness, uses a cane when out of his house He has problems with his inner ear problem. He does not want to go back PT because of cost. Seen by ENT in 2014 - Dr. Willeen Cass had evaluation done including MRI brain. He takes dramamin prn and seems to help with symptoms. Discussed risk of medication due to his age    HTN: he is taking medication as prescribed, no chest pain or palpitation. BP is at goal on lower dose of losartan.     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) 11/12/2015   Vitamin D deficiency 08/14/2015   Constipation 01/07/2015   Hyperlipidemia 01/07/2015   Narrowing of intervertebral disc space 08/25/2014   Type 2 diabetes mellitus with other diabetic kidney complication (HCC) 08/25/2014   Failure of erection 08/25/2014   Acid reflux 08/25/2014   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   Obstructive apnea 08/25/2014   Benign essential HTN 03/16/2009   Insomnia, persistent 02/10/2009    Past Surgical History:  Procedure Laterality Date   HERNIA REPAIR     HERNIA REPAIR Bilateral    inguinal     TONSILLECTOMY      Family History  Problem Relation Age of Onset   Heart disease Father    COPD Father     Social History   Tobacco Use   Smoking status: Former    Current packs/day: 0.00    Types: Cigarettes    Quit date: 04/04/1986    Years since quitting: 37.1   Smokeless tobacco: Never  Substance Use Topics   Alcohol use: No    Alcohol/week: 0.0 standard drinks of alcohol     Current Outpatient Medications:    acetaminophen (TYLENOL) 500 MG tablet, Take 1 tablet (500 mg total) by mouth every 6 (six) hours as needed., Disp: 60 tablet, Rfl: 0   atorvastatin (LIPITOR) 40 MG tablet, Take 1 tablet (40 mg total) by mouth daily., Disp: 90 tablet, Rfl: 1   Cholecalciferol (VITAMIN D) 50 MCG (2000 UT) CAPS, Take 1 capsule (2,000 Units total) by mouth daily., Disp: 30 capsule, Rfl: 0   Cyanocobalamin (B-12) 1000 MCG SUBL, Place 1 mcg under the tongue daily., Disp: 90 tablet, Rfl: 1   fenofibrate (TRICOR) 145 MG tablet, Take 1 tablet (145 mg total) by mouth daily. In place of lovaza, Disp: 90 tablet, Rfl: 1   gabapentin (NEURONTIN) 300 MG capsule, Take 2 capsules (600 mg total) by mouth at bedtime., Disp: 180 capsule, Rfl: 1   losartan (COZAAR) 50 MG tablet, Take 1 tablet (50 mg total) by mouth daily., Disp: 90 tablet, Rfl: 1   metFORMIN (GLUCOPHAGE-XR) 750 MG 24 hr tablet, Take 2 tablets (1,500 mg total) by mouth daily with breakfast., Disp: 180 tablet, Rfl: 1   pioglitazone (ACTOS) 15 MG tablet, Take 1 tablet (15 mg total) by mouth daily., Disp: 90 tablet, Rfl: 1   terbinafine (LAMISIL) 250 MG tablet, Take 1 tablet (250 mg total) by mouth daily., Disp: 90 tablet, Rfl: 0   QUEtiapine (SEROQUEL) 25 MG tablet, Take 1 tablet (25 mg total) by mouth at bedtime., Disp: 90 tablet, Rfl: 0  Allergies  Allergen Reactions   Trazodone And Nefazodone    Trazodone Anxiety    I personally reviewed active problem list, medication list, allergies, family history with the patient/caregiver  today.   ROS  Ten systems reviewed and is negative except as mentioned in HPI   Objective  Vitals:   05/03/23 1124  BP: 128/76  Pulse: 100  Resp: 16  SpO2: 98%  Weight: 233 lb 11.2 oz (106 kg)  Height: 5\' 9"  (1.753 m)    Body mass index is 34.51 kg/m.  Physical Exam  Constitutional: Patient appears well-developed and well-nourished. Obese  No distress.  HEENT: head atraumatic, normocephalic, pupils equal and reactive to light, neck supple Cardiovascular: Normal rate, regular rhythm  and normal heart sounds.  No murmur heard. No BLE edema. Pulmonary/Chest: Effort normal and breath sounds normal. No respiratory distress. Abdominal: Soft.  There is no tenderness. Psychiatric: Patient has a normal mood and affect. behavior is normal. Judgment and thought content normal.   Recent Results (from the past 2160 hours)  POCT glycosylated hemoglobin (Hb A1C)     Status: Abnormal   Collection Time: 05/03/23 11:29 AM  Result Value Ref Range   Hemoglobin A1C 6.4 (A) 4.0 - 5.6 %   HbA1c POC (<> result, manual entry)     HbA1c, POC (prediabetic range)     HbA1c, POC (controlled diabetic range)      Diabetic Foot Exam:  Title   Diabetic Foot Exam - detailed Pulse Foot Exam completed.: Yes      Sensory Foot Exam Completed.: Yes Semmes-Weinstein Monofilament Test "+" means "has sensation" and "-" means "no sensation"      Image components are not supported.   Image components are not supported. Image components are not supported.  Tuning Fork Comments Onychomycosis both feet       PHQ2/9:    05/03/2023   11:22 AM 09/06/2022   11:19 AM 06/17/2022    1:08 PM 05/02/2022    1:16 PM 12/20/2021    2:15 PM  Depression screen PHQ 2/9  Decreased Interest 0 0 0 0 0  Down, Depressed, Hopeless 0 0 0 0 0  PHQ - 2 Score 0 0 0 0 0  Altered sleeping 0 0 0 0 0  Tired, decreased energy 0 0 0 0 0  Change in appetite 0 0 0 0 0  Feeling bad or failure about yourself  0 0 0 0 0  Trouble  concentrating 0 0 0 0 0  Moving slowly or fidgety/restless 0 0 0 0 0  Suicidal thoughts 0 0 0 0 0  PHQ-9 Score 0 0 0 0 0  Difficult doing work/chores Not difficult at all        phq 9 is negative  Fall Risk:    05/03/2023   11:22 AM 09/06/2022   11:19 AM 06/17/2022    1:04 PM 05/02/2022    1:16 PM 12/20/2021    2:15 PM  Fall Risk   Falls in the past year? 0 0 0 0 0  Number falls in past yr: 0  0    Injury with Fall? 0  0    Risk for fall due to : No Fall Risks No Fall Risks No Fall Risks Impaired balance/gait No Fall Risks  Follow up Falls prevention discussed;Education provided;Falls evaluation completed Falls prevention discussed Education provided;Falls prevention discussed Falls prevention discussed;Education provided;Falls evaluation completed Falls prevention discussed;Education provided;Falls evaluation completed     Assessment & Plan  1. Type 2 diabetes mellitus with stage 3a chronic kidney disease, without long-term current use of insulin (HCC) (Primary)  - POCT glycosylated hemoglobin (Hb A1C)  2. Onychomycosis of multiple toenails with type 2 diabetes mellitus (HCC)  - HM DIABETES FOOT EXAM - terbinafine (LAMISIL) 250 MG tablet; Take 1 tablet (250 mg total) by mouth daily.  Dispense: 90 tablet; Refill: 0  He was advised to not fill rx until results of liver enzymes is back.   3. Dyslipidemia associated with type 2 diabetes mellitus (HCC)  - Lipid panel  4. Stage 3a chronic kidney disease (HCC)  - COMPLETE METABOLIC PANEL WITH GFR - CBC with Differential/Platelet  5. Obesity (BMI 30.0-34.9)  Discussed with the patient the risk  posed by an increased BMI. Discussed importance of portion control, calorie counting and at least 150 minutes of physical activity weekly. Avoid sweet beverages and drink more water. Eat at least 6 servings of fruit and vegetables daily    6. Gastroesophageal reflux disease with esophagitis without hemorrhage  stable  7. Insomnia,  persistent  - QUEtiapine (SEROQUEL) 25 MG tablet; Take 1 tablet (25 mg total) by mouth at bedtime.  Dispense: 90 tablet; Refill: 0  8. B12 deficiency  - B12 and Folate Panel  9. Vitamin D deficiency  - VITAMIN D 25 Hydroxy (Vit-D Deficiency, Fractures)  10. Anemia, unspecified type  - CBC with Differential/Platelet - Iron, TIBC and Ferritin Panel

## 2023-05-04 ENCOUNTER — Encounter: Payer: Self-pay | Admitting: Family Medicine

## 2023-05-04 LAB — COMPLETE METABOLIC PANEL WITHOUT GFR
AG Ratio: 1.9 (calc) (ref 1.0–2.5)
ALT: 21 U/L (ref 9–46)
AST: 22 U/L (ref 10–35)
Albumin: 4.4 g/dL (ref 3.6–5.1)
Alkaline phosphatase (APISO): 59 U/L (ref 35–144)
BUN: 14 mg/dL (ref 7–25)
CO2: 28 mmol/L (ref 20–32)
Calcium: 9.6 mg/dL (ref 8.6–10.3)
Chloride: 105 mmol/L (ref 98–110)
Creat: 1.12 mg/dL (ref 0.70–1.28)
Globulin: 2.3 g/dL (ref 1.9–3.7)
Glucose, Bld: 114 mg/dL — ABNORMAL HIGH (ref 65–99)
Potassium: 4.2 mmol/L (ref 3.5–5.3)
Sodium: 141 mmol/L (ref 135–146)
Total Bilirubin: 1.1 mg/dL (ref 0.2–1.2)
Total Protein: 6.7 g/dL (ref 6.1–8.1)
eGFR: 69 mL/min/{1.73_m2}

## 2023-05-04 LAB — CBC WITH DIFFERENTIAL/PLATELET
Absolute Lymphocytes: 1328 {cells}/uL (ref 850–3900)
Absolute Monocytes: 400 {cells}/uL (ref 200–950)
Basophils Absolute: 38 {cells}/uL (ref 0–200)
Basophils Relative: 0.7 %
Eosinophils Absolute: 238 {cells}/uL (ref 15–500)
Eosinophils Relative: 4.4 %
HCT: 40.8 % (ref 38.5–50.0)
Hemoglobin: 14.1 g/dL (ref 13.2–17.1)
MCH: 31.2 pg (ref 27.0–33.0)
MCHC: 34.6 g/dL (ref 32.0–36.0)
MCV: 90.3 fL (ref 80.0–100.0)
MPV: 10.9 fL (ref 7.5–12.5)
Monocytes Relative: 7.4 %
Neutro Abs: 3397 {cells}/uL (ref 1500–7800)
Neutrophils Relative %: 62.9 %
Platelets: 185 10*3/uL (ref 140–400)
RBC: 4.52 10*6/uL (ref 4.20–5.80)
RDW: 12.8 % (ref 11.0–15.0)
Total Lymphocyte: 24.6 %
WBC: 5.4 10*3/uL (ref 3.8–10.8)

## 2023-05-04 LAB — B12 AND FOLATE PANEL
Folate: 9.8 ng/mL
Vitamin B-12: 325 pg/mL (ref 200–1100)

## 2023-05-04 LAB — LIPID PANEL
Cholesterol: 148 mg/dL
HDL: 33 mg/dL — ABNORMAL LOW
LDL Cholesterol (Calc): 84 mg/dL
Non-HDL Cholesterol (Calc): 115 mg/dL
Total CHOL/HDL Ratio: 4.5 (calc)
Triglycerides: 217 mg/dL — ABNORMAL HIGH

## 2023-05-04 LAB — IRON,TIBC AND FERRITIN PANEL
%SAT: 28 % (ref 20–48)
Ferritin: 50 ng/mL (ref 24–380)
Iron: 92 ug/dL (ref 50–180)
TIBC: 334 ug/dL (ref 250–425)

## 2023-05-04 LAB — VITAMIN D 25 HYDROXY (VIT D DEFICIENCY, FRACTURES): Vit D, 25-Hydroxy: 51 ng/mL (ref 30–100)

## 2023-06-20 DIAGNOSIS — R2681 Unsteadiness on feet: Secondary | ICD-10-CM | POA: Diagnosis not present

## 2023-06-20 DIAGNOSIS — F5101 Primary insomnia: Secondary | ICD-10-CM | POA: Diagnosis not present

## 2023-06-20 DIAGNOSIS — E66811 Obesity, class 1: Secondary | ICD-10-CM | POA: Diagnosis not present

## 2023-06-20 DIAGNOSIS — E1142 Type 2 diabetes mellitus with diabetic polyneuropathy: Secondary | ICD-10-CM | POA: Diagnosis not present

## 2023-08-23 ENCOUNTER — Other Ambulatory Visit: Payer: Self-pay | Admitting: Family Medicine

## 2023-08-23 DIAGNOSIS — G4700 Insomnia, unspecified: Secondary | ICD-10-CM

## 2023-09-26 ENCOUNTER — Ambulatory Visit: Admitting: Family Medicine

## 2023-10-10 NOTE — Progress Notes (Signed)
 Pharmacy Quality Measure Review  This patient is appearing on a report for being at risk of failing the adherence measure for diabetes medications this calendar year.   Medication: metformin  ER 750 mg  Last fill date: 07/30/23 for 90 day supply  Insurance report was not up to date. No action needed at this time.   Bentley Fissel E. Marsh, PharmD Clinical Pharmacist [facility] [phone]

## 2023-10-25 DIAGNOSIS — E1142 Type 2 diabetes mellitus with diabetic polyneuropathy: Secondary | ICD-10-CM | POA: Diagnosis not present

## 2023-10-25 DIAGNOSIS — Z1331 Encounter for screening for depression: Secondary | ICD-10-CM | POA: Diagnosis not present

## 2023-10-25 DIAGNOSIS — R2681 Unsteadiness on feet: Secondary | ICD-10-CM | POA: Diagnosis not present

## 2023-10-25 DIAGNOSIS — I10 Essential (primary) hypertension: Secondary | ICD-10-CM | POA: Diagnosis not present

## 2023-10-25 DIAGNOSIS — N1831 Chronic kidney disease, stage 3a: Secondary | ICD-10-CM | POA: Diagnosis not present

## 2023-10-25 DIAGNOSIS — H8122 Vestibular neuronitis, left ear: Secondary | ICD-10-CM | POA: Diagnosis not present

## 2023-10-25 DIAGNOSIS — E66811 Obesity, class 1: Secondary | ICD-10-CM | POA: Diagnosis not present

## 2023-10-25 DIAGNOSIS — E538 Deficiency of other specified B group vitamins: Secondary | ICD-10-CM | POA: Diagnosis not present

## 2023-10-25 DIAGNOSIS — I872 Venous insufficiency (chronic) (peripheral): Secondary | ICD-10-CM | POA: Diagnosis not present

## 2023-10-25 DIAGNOSIS — Z0000 Encounter for general adult medical examination without abnormal findings: Secondary | ICD-10-CM | POA: Diagnosis not present

## 2023-10-26 ENCOUNTER — Other Ambulatory Visit: Payer: Self-pay | Admitting: Family Medicine

## 2023-10-26 DIAGNOSIS — E1169 Type 2 diabetes mellitus with other specified complication: Secondary | ICD-10-CM

## 2023-11-06 ENCOUNTER — Telehealth: Payer: Self-pay

## 2023-11-06 NOTE — Progress Notes (Signed)
 Pharmacy Quality Measure Review  This patient is appearing on a report for being at risk of failing the adherence measure for diabetes medications this calendar year.   Medication: metformin  XR 750 mg  Last fill date: 07/30/23 for 90 day supply  No refills remaining at the pharmacy. Patient is no longer seen with this clinic.   Crisanto Nied E. Marsh, PharmD Clinical Pharmacist [facility] [phone]

## 2024-03-06 NOTE — Progress Notes (Signed)
 Brent Carroll                                          MRN: [id number]   03/06/2024   The [facility] Quality Team Specialist reviewed this patient medical record for the purposes of chart review for care gap closure. The following were reviewed: chart review for care gap closure-kidney health evaluation for diabetes:eGFR  and uACR.    [facility]I Quality Team

## 2024-03-28 ENCOUNTER — Encounter: Payer: Self-pay | Admitting: Ophthalmology

## 2024-04-01 ENCOUNTER — Encounter: Payer: Self-pay | Admitting: Ophthalmology

## 2024-04-02 ENCOUNTER — Other Ambulatory Visit: Payer: Self-pay

## 2024-04-02 ENCOUNTER — Encounter: Payer: Self-pay | Admitting: Ophthalmology

## 2024-04-02 ENCOUNTER — Encounter: Payer: Self-pay | Admitting: Anesthesiology

## 2024-04-02 ENCOUNTER — Ambulatory Visit
Admission: RE | Admit: 2024-04-02 | Discharge: 2024-04-02 | Disposition: A | Discharge status: Home / Self Care | Attending: Ophthalmology | Admitting: Ophthalmology

## 2024-04-02 ENCOUNTER — Encounter
Admission: RE | Disposition: A | Discharge status: Home / Self Care | Payer: Self-pay | Source: Home / Self Care | Attending: Ophthalmology

## 2024-04-02 DIAGNOSIS — I10 Essential (primary) hypertension: Secondary | ICD-10-CM | POA: Insufficient documentation

## 2024-04-02 DIAGNOSIS — M1990 Unspecified osteoarthritis, unspecified site: Secondary | ICD-10-CM | POA: Insufficient documentation

## 2024-04-02 DIAGNOSIS — G4733 Obstructive sleep apnea (adult) (pediatric): Secondary | ICD-10-CM | POA: Insufficient documentation

## 2024-04-02 DIAGNOSIS — E1136 Type 2 diabetes mellitus with diabetic cataract: Secondary | ICD-10-CM | POA: Insufficient documentation

## 2024-04-02 DIAGNOSIS — Z87891 Personal history of nicotine dependence: Secondary | ICD-10-CM | POA: Insufficient documentation

## 2024-04-02 DIAGNOSIS — H2512 Age-related nuclear cataract, left eye: Secondary | ICD-10-CM | POA: Insufficient documentation

## 2024-04-02 HISTORY — DX: Unspecified osteoarthritis, unspecified site: M19.90

## 2024-04-02 HISTORY — DX: Myoneural disorder, unspecified: G70.9

## 2024-04-02 HISTORY — DX: Personal history of urinary calculi: Z87.442

## 2024-04-02 MED ORDER — BRIMONIDINE TARTRATE-TIMOLOL 0.2-0.5 % OP SOLN
OPHTHALMIC | Status: DC | PRN
  Administered 2024-04-02: 1 [drp] via OPHTHALMIC

## 2024-04-02 MED ORDER — SIGHTPATH DOSE#1 NA CHONDROIT SULF-NA HYALURON 40-17 MG/ML IO SOLN
INTRAOCULAR | Status: DC | PRN
  Administered 2024-04-02: 1 mL via INTRAOCULAR

## 2024-04-02 MED ORDER — SIGHTPATH DOSE#1 BSS IO SOLN
INTRAOCULAR | Status: DC | PRN
  Administered 2024-04-02: 15 mL via INTRAOCULAR

## 2024-04-02 MED ORDER — MIDAZOLAM HCL 2 MG/2ML IJ SOLN
INTRAMUSCULAR | Status: AC
  Filled 2024-04-02: qty 2

## 2024-04-02 MED ORDER — CYCLOPENTOLATE HCL 2 % OP SOLN
1.0000 [drp] | OPHTHALMIC | Status: AC | PRN
  Administered 2024-04-02 (×3): 1 [drp] via OPHTHALMIC

## 2024-04-02 MED ORDER — FENTANYL CITRATE (PF) 100 MCG/2ML IJ SOLN
INTRAMUSCULAR | Status: DC | PRN
  Administered 2024-04-02: 50 ug via INTRAVENOUS

## 2024-04-02 MED ORDER — CYCLOPENTOLATE HCL 2 % OP SOLN
OPHTHALMIC | Status: AC
  Filled 2024-04-02: qty 2

## 2024-04-02 MED ORDER — MOXIFLOXACIN HCL 0.5 % OP SOLN
OPHTHALMIC | Status: DC | PRN
  Administered 2024-04-02: .2 mL via OPHTHALMIC

## 2024-04-02 MED ORDER — TETRACAINE HCL 0.5 % OP SOLN
1.0000 [drp] | OPHTHALMIC | Status: DC | PRN
  Administered 2024-04-02 (×3): 1 [drp] via OPHTHALMIC

## 2024-04-02 MED ORDER — SIGHTPATH DOSE#1 BSS IO SOLN
INTRAOCULAR | Status: DC | PRN
  Administered 2024-04-02: 64 mL via OPHTHALMIC

## 2024-04-02 MED ORDER — PHENYLEPHRINE HCL 10 % OP SOLN
1.0000 [drp] | OPHTHALMIC | Status: AC | PRN
  Administered 2024-04-02 (×3): 1 [drp] via OPHTHALMIC

## 2024-04-02 MED ORDER — LIDOCAINE HCL (PF) 2 % IJ SOLN
INTRAOCULAR | Status: DC | PRN
  Administered 2024-04-02: 2 mL

## 2024-04-02 MED ORDER — TETRACAINE HCL 0.5 % OP SOLN
OPHTHALMIC | Status: AC
  Filled 2024-04-02: qty 4

## 2024-04-02 MED ORDER — FENTANYL CITRATE (PF) 100 MCG/2ML IJ SOLN
INTRAMUSCULAR | Status: AC
  Filled 2024-04-02: qty 2

## 2024-04-02 MED ORDER — MIDAZOLAM HCL (PF) 2 MG/2ML IJ SOLN
INTRAMUSCULAR | Status: DC | PRN
  Administered 2024-04-02 (×2): 1 mg via INTRAVENOUS

## 2024-04-02 MED ORDER — PHENYLEPHRINE HCL 10 % OP SOLN
OPHTHALMIC | Status: AC
  Filled 2024-04-02: qty 5

## 2024-04-02 NOTE — H&P (Signed)
 [facility]   Primary Care Physician:  Bertrum Charlie CROME, MD Ophthalmologist: Dr. Elsie Carmine  Pre-Procedure History & Physical: HPI:  Brent Carroll is a 79 y.o. male here for cataract surgery.   Past Medical History:  Diagnosis Date   Arthritis    Depression    Diabetes mellitus without complication (HCC)    type 2   History of kidney stones    Hyperlipidemia    Hypertension    Hypogonadism in male    Insomnia    Neuromuscular disorder (HCC)    neuropathy   Obesity    Obstructive sleep apnea    Currently not using a CPAP    Past Surgical History:  Procedure Laterality Date   HERNIA REPAIR     HERNIA REPAIR Bilateral    inguinal    TONSILLECTOMY      Prior to Admission medications  Medication Sig Start Date End Date Taking? Authorizing Provider  atorvastatin  (LIPITOR) 40 MG tablet Take 1 tablet (40 mg total) by mouth daily. 12/30/22  Yes Sowles, Krichna, MD  Cholecalciferol (VITAMIN D ) 50 MCG (2000 UT) CAPS Take 1 capsule (2,000 Units total) by mouth daily. 04/21/21  Yes Sowles, Krichna, MD  Cyanocobalamin  (B-12) 1000 MCG SUBL Place 1 mcg under the tongue daily. 08/18/21  Yes Sowles, Krichna, MD  fenofibrate  (TRICOR ) 145 MG tablet Take 1 tablet (145 mg total) by mouth daily. In place of lovaza  12/30/22  Yes Sowles, Krichna, MD  gabapentin  (NEURONTIN ) 300 MG capsule Take 2 capsules (600 mg total) by mouth at bedtime. 12/30/22  Yes Sowles, Krichna, MD  losartan  (COZAAR ) 50 MG tablet Take 1 tablet (50 mg total) by mouth daily. 12/30/22  Yes Sowles, Krichna, MD  metFORMIN  (GLUCOPHAGE -XR) 750 MG 24 hr tablet Take 2 tablets (1,500 mg total) by mouth daily with breakfast. 12/30/22  Yes Sowles, Krichna, MD  pioglitazone  (ACTOS ) 15 MG tablet Take 1 tablet (15 mg total) by mouth daily. 12/30/22  Yes Sowles, Krichna, MD  QUEtiapine  (SEROQUEL ) 25 MG tablet Take 25 mg by mouth at bedtime.   Yes [provider]  acetaminophen  (TYLENOL ) 500 MG tablet Take 1 tablet  (500 mg total) by mouth every 6 (six) hours as needed. 10/07/16   Sowles, Krichna, MD    Allergies as of 03/15/2024 - Review Complete 05/03/2023  Allergen Reaction Noted   Trazodone and nefazodone  08/22/2014   Trazodone Anxiety 08/22/2014    Family History  Problem Relation Age of Onset   Heart disease Father    COPD Father     Social History   Socioeconomic History   Marital status: Married    Spouse name: Darice   Number of children: 3   Years of education: Not on file   Highest education level: Some college, no degree  Occupational History   Not on file  Tobacco Use   Smoking status: Former    Current packs/day: 0.00    Types: Cigarettes    Quit date: 04/04/1986    Years since quitting: 38.0   Smokeless tobacco: Never  Vaping Use   Vaping status: Never Used  Substance and Sexual Activity   Alcohol use: No    Alcohol/week: 0.0 standard drinks of alcohol   Drug use: Never   Sexual activity: Not Currently    Partners: Female  Other Topics Concern   Not on file  Social History Narrative   Not on file   Social Drivers of Health   Tobacco Use: Medium Risk (04/02/2024)  Patient History    Smoking Tobacco Use: Former    Smokeless Tobacco Use: Never    Passive Exposure: Not on file  Financial Resource Strain: Low Risk  (10/25/2023)   Received from [facility]   Overall Financial Resource Strain (CARDIA)    Difficulty of Paying Living Expenses: Not hard at all  Food Insecurity: No Food Insecurity (10/25/2023)   Received from [facility]   Epic    Within the past 12 months, you worried that your food would run out before you got the money to buy more.: Never true    Within the past 12 months, the food you bought just didn't last and you didn't have money to get more.: Never true  Transportation Needs: No Transportation Needs (10/25/2023)   Received from [facility] - Transportation    In the past 12  months, has lack of transportation kept you from medical appointments or from getting medications?: No    Lack of Transportation (Non-Medical): No  Physical Activity: Inactive (06/17/2022)   Exercise Vital Sign    Days of Exercise per Week: 0 days    Minutes of Exercise per Session: 0 min  Stress: No Stress Concern Present (06/17/2022)   [employer] - Occupational Stress Questionnaire    Feeling of Stress : Not at all  Social Connections: Moderately Integrated (06/17/2022)   Social Connection and Isolation Panel    Frequency of Communication with Friends and Family: More than three times a week    Frequency of Social Gatherings with Friends and Family: More than three times a week    Attends Religious Services: More than 4 times per year    Active Member of [employer] or Organizations: No    Attends Banker Meetings: Never    Marital Status: Married  Catering Manager Violence: Not At Risk (06/17/2022)   Humiliation, Afraid, Rape, and Kick questionnaire    Fear of Current or Ex-Partner: No    Emotionally Abused: No    Physically Abused: No    Sexually Abused: No  Depression (PHQ2-9): Low Risk (05/03/2023)   Depression (PHQ2-9)    PHQ-2 Score: 0  Alcohol Screen: Low Risk (06/17/2022)   Alcohol Screen    Last Alcohol Screening Score (AUDIT): 0  Housing: Low Risk  (10/25/2023)   Received from [facility]   Epic    In the last 12 months, was there a time when you were not able to pay the mortgage or rent on time?: No    In the past 12 months, how many times have you moved where you were living?: 0    At any time in the past 12 months, were you homeless or living in a shelter (including now)?: No  Utilities: Not At Risk (10/25/2023)   Received from [facility]   Epic    In the past 12 months has the electric, gas, oil, or water company threatened to shut off services in your home?: No  Health Literacy: Adequate Health  Literacy (10/25/2023)   Received from [facility]   [phone] Health Literacy    How often do you need to have someone help you when you read instructions, pamphlets, or other written material from your doctor or pharmacy?: Never    Review of Systems: See HPI, otherwise negative ROS  Physical Exam: BP (!) 154/86   Pulse 69   Temp 98.5 F (36.9  C) (Temporal)   Resp 13   Ht 5' 9.02 (1.753 m)   Wt 106.6 kg   SpO2 97%   BMI 34.69 kg/m  General:   Alert, cooperative. Head:  Normocephalic and atraumatic. Respiratory:  Normal work of breathing. Cardiovascular:  NAD  Impression/Plan: Brent Carroll is here for cataract surgery.  Risks, benefits, limitations, and alternatives regarding cataract surgery have been reviewed with the patient.  Questions have been answered.  All parties agreeable.   Elsie Carmine, MD  04/02/2024, 8:32 AM

## 2024-04-02 NOTE — Anesthesia Postprocedure Evaluation (Signed)
"   Anesthesia Post Note  Patient: Brent Carroll  Procedure(s) Performed: PHACOEMULSIFICATION, CATARACT, WITH IOL INSERTION 10.28 01:00.2 (Left: Eye)  Anesthesia Type: MAC Anesthetic complications: no   No notable events documented.   Last Vitals:  Vitals:   04/02/24 0807  BP: (!) 154/86  Pulse: 69  Resp: 13  Temp: 36.9 C  SpO2: 97%    Last Pain:  Vitals:   04/02/24 0807  TempSrc: Temporal  PainSc: 0-No pain                 Redell MARLA Breaker      "

## 2024-04-16 ENCOUNTER — Ambulatory Visit: Admit: 2024-04-16 | Admitting: Ophthalmology

## 2024-04-16 SURGERY — PHACOEMULSIFICATION, CATARACT, WITH IOL INSERTION
Anesthesia: Topical | Laterality: Right
